# Patient Record
Sex: Male | Born: 1940 | Race: White | Hispanic: No | Marital: Single | Smoking: Former smoker
Health system: Southern US, Community
[De-identification: ages and names within clinical notes are randomized; demographics above are authoritative.]

## PROBLEM LIST (undated history)

## (undated) DIAGNOSIS — F329 Major depressive disorder, single episode, unspecified: Secondary | ICD-10-CM

## (undated) DIAGNOSIS — F32A Depression, unspecified: Secondary | ICD-10-CM

## (undated) DIAGNOSIS — I639 Cerebral infarction, unspecified: Secondary | ICD-10-CM

## (undated) DIAGNOSIS — G459 Transient cerebral ischemic attack, unspecified: Secondary | ICD-10-CM

## (undated) DIAGNOSIS — I10 Essential (primary) hypertension: Secondary | ICD-10-CM

## (undated) DIAGNOSIS — C679 Malignant neoplasm of bladder, unspecified: Secondary | ICD-10-CM

## (undated) DIAGNOSIS — E785 Hyperlipidemia, unspecified: Secondary | ICD-10-CM

## (undated) DIAGNOSIS — N184 Chronic kidney disease, stage 4 (severe): Secondary | ICD-10-CM

## (undated) DIAGNOSIS — I739 Peripheral vascular disease, unspecified: Secondary | ICD-10-CM

## (undated) DIAGNOSIS — Z515 Encounter for palliative care: Secondary | ICD-10-CM

## (undated) DIAGNOSIS — I714 Abdominal aortic aneurysm, without rupture: Secondary | ICD-10-CM

## (undated) DIAGNOSIS — C61 Malignant neoplasm of prostate: Secondary | ICD-10-CM

## (undated) HISTORY — DX: Encounter for palliative care: Z51.5

## (undated) HISTORY — DX: Abdominal aortic aneurysm, without rupture: I71.4

## (undated) HISTORY — DX: Hyperlipidemia, unspecified: E78.5

## (undated) HISTORY — DX: Chronic kidney disease, stage 4 (severe): N18.4

## (undated) HISTORY — DX: Cerebral infarction, unspecified: I63.9

---

## 1992-08-05 DIAGNOSIS — C61 Malignant neoplasm of prostate: Secondary | ICD-10-CM

## 1992-08-05 HISTORY — DX: Malignant neoplasm of prostate: C61

## 1992-08-05 HISTORY — PX: PROSTATE SURGERY: SHX751

## 1993-08-05 DIAGNOSIS — I714 Abdominal aortic aneurysm, without rupture, unspecified: Secondary | ICD-10-CM

## 1993-08-05 HISTORY — PX: ABDOMINAL AORTIC ANEURYSM REPAIR: SUR1152

## 1993-08-05 HISTORY — DX: Abdominal aortic aneurysm, without rupture, unspecified: I71.40

## 1993-08-05 HISTORY — DX: Abdominal aortic aneurysm, without rupture: I71.4

## 2003-08-06 HISTORY — PX: HERNIA REPAIR: SHX51

## 2004-05-29 ENCOUNTER — Encounter: Admission: RE | Admit: 2004-05-29 | Discharge: 2004-05-29 | Payer: Self-pay | Admitting: Internal Medicine

## 2005-02-26 ENCOUNTER — Emergency Department (HOSPITAL_COMMUNITY): Admission: EM | Admit: 2005-02-26 | Discharge: 2005-02-26 | Payer: Self-pay | Admitting: Emergency Medicine

## 2005-03-06 ENCOUNTER — Encounter: Admission: RE | Admit: 2005-03-06 | Discharge: 2005-03-06 | Payer: Self-pay | Admitting: Family Medicine

## 2006-08-05 DIAGNOSIS — G459 Transient cerebral ischemic attack, unspecified: Secondary | ICD-10-CM

## 2006-08-05 HISTORY — DX: Transient cerebral ischemic attack, unspecified: G45.9

## 2006-10-22 ENCOUNTER — Encounter: Admission: RE | Admit: 2006-10-22 | Discharge: 2006-10-22 | Payer: Self-pay | Admitting: Gastroenterology

## 2007-12-05 ENCOUNTER — Ambulatory Visit: Payer: Self-pay | Admitting: Internal Medicine

## 2007-12-05 ENCOUNTER — Inpatient Hospital Stay (HOSPITAL_COMMUNITY): Admission: EM | Admit: 2007-12-05 | Discharge: 2007-12-10 | Payer: Self-pay | Admitting: Emergency Medicine

## 2007-12-07 ENCOUNTER — Encounter (INDEPENDENT_AMBULATORY_CARE_PROVIDER_SITE_OTHER): Payer: Self-pay | Admitting: Gastroenterology

## 2007-12-29 ENCOUNTER — Encounter: Admission: RE | Admit: 2007-12-29 | Discharge: 2007-12-29 | Payer: Self-pay | Admitting: Gastroenterology

## 2008-01-08 ENCOUNTER — Encounter: Admission: RE | Admit: 2008-01-08 | Discharge: 2008-01-08 | Payer: Self-pay | Admitting: Gastroenterology

## 2008-02-29 ENCOUNTER — Ambulatory Visit (HOSPITAL_COMMUNITY): Admission: RE | Admit: 2008-02-29 | Discharge: 2008-02-29 | Payer: Self-pay | Admitting: Surgery

## 2008-08-05 DIAGNOSIS — C679 Malignant neoplasm of bladder, unspecified: Secondary | ICD-10-CM

## 2008-08-05 HISTORY — DX: Malignant neoplasm of bladder, unspecified: C67.9

## 2008-08-05 HISTORY — PX: COLON SURGERY: SHX602

## 2009-04-28 ENCOUNTER — Ambulatory Visit (HOSPITAL_COMMUNITY): Admission: RE | Admit: 2009-04-28 | Discharge: 2009-04-28 | Payer: Self-pay | Admitting: Gastroenterology

## 2009-12-13 ENCOUNTER — Encounter: Admission: RE | Admit: 2009-12-13 | Discharge: 2009-12-13 | Payer: Self-pay | Admitting: Family Medicine

## 2010-08-26 ENCOUNTER — Encounter: Payer: Self-pay | Admitting: Vascular Surgery

## 2010-08-26 ENCOUNTER — Encounter: Payer: Self-pay | Admitting: Gastroenterology

## 2010-12-18 NOTE — Op Note (Signed)
NAME:  Kevin Escobar, Kevin Escobar NO.:  [id number]   MEDICAL RECORD NO.:  [id number]          PATIENT TYPE:  INP   LOCATION:  5013                         FACILITY:  [facility]   PHYSICIAN:  John C. Madilyn Fireman, M.D.    DATE OF BIRTH:  [birth year]   DATE OF PROCEDURE:  DATE OF DISCHARGE:                               OPERATIVE REPORT   INDICATIONS FOR PROCEDURE:  Abdominal pain with sigmoid colon mass with  suggestion of ischemic colitis.   PROCEDURE:  The patient was placed in the left lateral decubitus  position and placed on the pulse monitor with continuous low-flow oxygen  delivered by nasal cannula.  He was sedated with 50 mcg of IV fentanyl  and 4 mg IV Versed.  Olympus video colonoscope was inserted into the  rectum and advanced to the ileocecal valve.  The prep was very poor in  this area, and the patient had not been given a full bowel prep, but  only 2 Fleet Enema.  I was not able to see much detail of the cecum, and  the visualization of part of the ascending transverse colon were  somewhat limited as well, although normal when the representative areas  were washed with the syringe.  There appeared to be no mucosal  abnormalities until. About 55 cm where there was a fairly abrupt  transition to diffuse nodular colitis with confluent white exudate.  This was most consistent appearance with ischemic colitis.  The mucosa  was somewhat firm, and it was biopsied.  This lasted over about 10-15 cm  but gradually attenuated a normal mucosa down to about 30 cm where there  was a vague mass effect of protuberant mucosal folds that appeared to  have proximal dilatation above it creating somewhat of a volvulus-type  effect.  It was somewhat difficult to pass the scope through this area.  I really did not see any definite mucosal abnormalities other than some  edema and granularity, and I took a couple of biopsies from the area  where the lumina appeared to be much narrow.  The mucosa  distal to this  donut shaped possibly extrinsic mass was normal.  The scope was then  withdrawn, and the patient returned to the recovery room in stable  condition.  He tolerated the procedure well.  There were no immediate  complications.   IMPRESSION:  1. Segmental colitis of the descending colon, most likely consistent      with ischemia.  2. Persistent mass effect without any intrinsic neoplasm seen in the      sigmoid colon.   PLAN:  Await biopsies.  Treat with bowel rest and antibiotics at some  point.  If his colitis heals, I think he may need surgical resection of  the vague mass in the sigmoid colon.           ______________________________  Everardo All Madilyn Fireman, M.D.     JCH/MEDQ  D:  12/07/2007  T:  12/08/2007  Job:  [id number]

## 2010-12-18 NOTE — Discharge Summary (Signed)
NAME:  Kevin Escobar, Kevin Escobar NO.:  [id number]   MEDICAL RECORD NO.:  [id number]          PATIENT TYPE:  INP   LOCATION:  5013                         FACILITY:  [facility]   PHYSICIAN:  Michiel Cowboy, MDDATE OF BIRTH:  1940-08-12   DATE OF ADMISSION:  12/04/2007  DATE OF DISCHARGE:  12/10/2007                               DISCHARGE SUMMARY   PRIMARY CARE Tremar Wickens:  Dr. Hyacinth Meeker.   DISCHARGE DIAGNOSES:  Include:  1. Colitis, secondary to ischemia versus infectious etiology.  2. History of abdominal aortic aneurysm.  3. History of colon mass.  4. Ileus.  5. Chronic renal insufficiency.   STUDIES:  1. Abdomen film on Dec 05, 2007, and Dec 07, 2007, showing possible      ileus.  2. CT of the abdomen and pelvis done on Dec 05, 2007, showing stable      mass within the sigmoid colon, correlation with endoscopy is      warranted if not already performed.   Other studies include colonoscopy showing segmental colitis of the  ascending colon most likely consistent ischemia, persistent mass effect  without any intrinsic neoplasm seen in the sigmoid colon.  Masses  biopsied.   HOSPITAL COURSE:  Please see full H&P for details.  This is a 70-year-  old gentleman with history of abdominal aortic aneurysm who comes in  with history of nausea, vomiting, and abdominal pain.  CT showed a colon  mass, which the patient already had a history of as well as questionable  colitis.  The patient was admitted and started on Cipro and Flagyl.  GI  consult was called.  Dr. Madilyn Fireman has seen the patient.  He had a  colonoscopy performed that showed evidence of colitis possibly secondary  to ischemia and the patient was also started on antibiotics for  presumptive potential infectious colitis as well and great improvement.  A plain KUB initially showed ileus, but the patient was able to tolerate  p.o. and his nausea and vomiting resolved at the time of discharge.   Chronic renal insufficiency.   Per review of records, the patient's  creatinine is between 1.4 to 1.6 or so.  At the time of discharge, the  patient's creatinine 1.6, which is close to his baseline.  The patient  probably will need to have this followed up as an outpatient.   Colon mass.  The patient had a past history of this in the past.  CT of  the abdomen confirmed this.  The patient will need an outpatient  followup with surgery for possible resection during colonoscopy as part  of mass was sent for biopsy.  The patient will need to have followup  with the pathology.   DISCHARGE MEDICATIONS:  1. Cipro 500 mg p.o. b.i.d. x10 days.  2. Flagyl 400 mg p.o. t.i.d. x10 days.  3. Protonix 40 mg p.o. daily.  4. Aspirin 81 mg p.o. daily.  5. The patient is to hold Benicar until further evaluated by his      primary care Chaunice Obie.   FOLLOWUP:  1. The patient is to follow up with  Dr. Hyacinth Meeker in the next week to      have his creatinine followed as well as post hospitalization.  2. The patient is to follow up in 1 month with Dr. Dr. Letitia Libra of GI,      and the patient is to follow up his scheduled appointment with Dr.      Jamey Ripa before surgery for further evaluation of a colon mass.      Michiel Cowboy, MD  Electronically Signed     AVD/MEDQ  D:  12/10/2007  T:  12/11/2007  Job:  [id number]   cc:   Everardo All. Madilyn Fireman, M.D.  Currie Paris, M.D.  Gracelyn Nurse, M.D.

## 2010-12-18 NOTE — Consult Note (Signed)
NAME:  Kevin Escobar, Kevin Escobar NO.:  [id number]   MEDICAL RECORD NO.:  [id number]          PATIENT TYPE:  INP   LOCATION:  5013                         FACILITY:  [facility]   PHYSICIAN:  John C. Madilyn Fireman, M.D.    DATE OF BIRTH:  Jun 29, 1941   DATE OF CONSULTATION:  12/06/2007  DATE OF DISCHARGE:                                 CONSULTATION   REASON FOR CONSULTATION:  Abdominal pain, nausea and vomiting,  leukocytosis, and sigmoid mass on CT as well as question of colitis.   HISTORY OF ILLNESS:  The patient is a 70 year old white male, somewhat  of a poor historian, who presents with about a 1-week history of  variable migrating abdominal pain upper and lower which became worse 2  days ago with two or three episodes of vomiting that day only, which  prompted him to come to the emergency room.  He had a white blood cell  count of 29,000 and underwent an abdominal CT scan which showed stable  positioning of abdominal aortic aneurysm graft and persistence of a 3-  to 4-cm sigmoid mass that was seen on a previous CT scan a year ago.  Also suggested was some mild descending colitis.  He had a colonoscopy  by Dr. Laural Benes which revealed this mass in March 2008 but biopsies were  negative.  It was recommended for repeat colonoscopy but never followed  up.  The patient has been having bowel movements for the most part  although they have decreased in frequency and required some straining in  the last 3 or 4 days.  He denies any known fever at home but did have a  temperature of 100.8 today.  He has been started on empiric antibiotics  and he was thought initially to possibly have pneumonia but on review of  chest x-ray this appears more likely to be atelectasis.  He is currently  on levofloxacin.   PAST MEDICAL HISTORY:  1. Colon mass as mentioned above.  2. History of aortic aneurysm with grafting.  3. Mild ascending thoracic aneurysm by history.   MEDICATIONS:  None.   ALLERGIES:   None.   SOCIAL HISTORY:  The patient has long-time cigarette smoking.  Denies  alcohol use.   FAMILY HISTORY:  Noncontributory.   PHYSICAL EXAMINATION:  Well-developed, well-nourished white male in no  acute distress, somewhat forgetful and a bit of a poor historian with  admitted poor memory.  HEART:  Regular rate and rhythm without murmur.  ABDOMEN:  Soft, slightly distended with normoactive bowel sounds.  There  is mild diffuse tenderness.  There appears to some mild focal rebound  diffusely as well.   LABORATORIES:  WBC 29,000 initially; 20,000 today.  Hemoglobin 16.5,  platelet count 337.  Amylase and lactic acid level are normal.  Liver  function tests within normal limits.  CO2 21, BUN 33, creatinine 1.89.  KUB done yesterday shows some mild dilatation of the large and small  bowel compatible with a possible ileus.   IMPRESSION:  Abdominal pain, nausea, vomiting, leukocytosis with  suggestion of possible ischemic colitis, but also with  a known sigmoid  mass, the etiology of which is unclear.   PLAN:  Agree with empiric antibiotics.  Will probably otherwise begin  workup with sigmoidoscopy and repeat biopsy of the mass tomorrow.           ______________________________  Everardo All Madilyn Fireman, M.D.     JCH/MEDQ  D:  12/06/2007  T:  12/06/2007  Job:  [id number]   cc:   Danise Edge, M.D.

## 2010-12-18 NOTE — H&P (Signed)
NAME:  Kevin Escobar, Kevin Escobar NO.:  [id number]   MEDICAL RECORD NO.:  [id number]          PATIENT TYPE:  EMS   LOCATION:  MAJO                         FACILITY:  [facility]   PHYSICIAN:  Gardiner Barefoot, MD    DATE OF BIRTH:  10-17-1940   DATE OF ADMISSION:  12/04/2007  DATE OF DISCHARGE:                              HISTORY & PHYSICAL   PRIMARY CARE PHYSICIAN:  Dr. Hyacinth Meeker.   CHIEF COMPLAINT:  Nausea and vomiting.   HISTORY OF PRESENT ILLNESS:  This is a 70 year old male with a history  of smoking and history of CAT scan with colonic mass consistent with a  primary cancer, and a history of abdominal aortic aneurysm with the  grafting, who presents with 2 days of nausea and vomiting and 1 week of  abdominal pain.  The patient states that he had been constipated, and  otherwise he has had no fever, no shortness of breath, or any other  significant complaints.   PAST MEDICAL HISTORY:  1. Abdominal aortic aneurysm with grafting.  2. Mild ascending thoracic aneurysm.  3. Colon mass.   MEDICATIONS:  None.   ALLERGIES:  No known drug allergies.   SOCIAL HISTORY:  The patient is a smoker.  He denies any alcohol or  drugs.   FAMILY HISTORY:  Noncontributory.   REVIEW OF SYSTEMS:  Review of systems is negative except as per the  history of present illness.   PHYSICAL EXAMINATION:  VITAL SIGNS:  Temperature 97.2, pulse is 114,  respirations 18, blood pressure is 147/86, and O2 sats 92%.  GENERAL:  The patient is awake, alert, and oriented x3, and appears in  no acute distress.  CARDIOVASCULAR:  Regular rate and rhythm.  No murmurs, rubs, or gallops.  LUNGS:  Clear to auscultation bilaterally.  ABDOMEN:  Soft, nontender, and nondistended with positive bowel sounds,  and no hepatosplenomegaly.  EXTREMITIES:  No cyanosis, clubbing, or edema.   LABORATORY DATA:  WBC of 29.6, 90% neutrophils, hemoglobin 16, platelets  337, sodium 141, potassium 4.0, chloride 107, bicarb 21,  BUN 33,  creatinine is 1.89, glucose 147, and AST 23.  CAT scan with stable mass  and a right lower lobe airspace disease.  The UA with trace leukocyte  esterase and positive proteinuria.   ASSESSMENT AND PLAN:  1. Pneumonia.  We will start the patient on azithromycin and      ceftriaxone for community-acquired pneumonia therapy.  2. Abdominal pain.  We will continue with a regimen for constipation      as his pain may be secondary to his reported constipation.      Certainly, if his abdominal mass in the sigmoid colon, it could      certainly be secondary to that, and if it does not improve with his      therapies for now then we will consider a surgical evaluation;      however, at this time, the patient is stable and does not have a      surgical abdomen.  3. Renal insufficiency with proteinuria.  Unclear if this new or not.  There is no recent history of an elevated creatinine or      proteinuria.  We will hydrate the patient and also sent off for a      urine culture to sure it is not an infection, and consider a      nephrology in put if it is persistent.      Gardiner Barefoot, MD  Electronically Signed     RWC/MEDQ  D:  12/05/2007  T:  12/05/2007  Job:  [id number]

## 2010-12-18 NOTE — Op Note (Signed)
NAME:  Kevin Escobar, Kevin Escobar NO.:  [id number]   MEDICAL RECORD NO.:  [id number]          PATIENT TYPE:  AMB   LOCATION:  SDS                          FACILITY:  [facility]   PHYSICIAN:  Currie Paris, M.D.DATE OF BIRTH:  1940-09-25   DATE OF PROCEDURE:  02/29/2008  DATE OF DISCHARGE:  02/29/2008                               OPERATIVE REPORT   PREOPERATIVE DIAGNOSIS:  Epigastric hernia.   POSTOPERATIVE DIAGNOSIS:  Epigastric hernia.   OPERATION:  Repair of epigastric hernia with mesh.   SURGEON:  Currie Paris, MD   ANESTHESIA:  General.   CLINICAL HISTORY:  This is a 70 year old gentleman who has an epigastric  hernia, although the defect felt relatively small, had at one point a  fair amount of material protruding out.  He has also had a question of a  colon mass that has been stable and he has declined further evaluation  of that on a couple of occasions including declining laparoscopy and/or  laparotomy and colon resection.  However, because he had had  symptomatology from this epigastric hernia, he wished to have that  fixed.   DESCRIPTION OF PROCEDURE:  The patient was seen in the holding area and  had no further questions.  I marked the area of the epigastric hernia,  which was just above the umbilicus.   The patient was taken to the operating room and after satisfactory  general anesthesia had been obtained, the abdomen was clipped, prepped,  and draped.  The time-out was done.   I used some 0.25% plain Marcaine to help with postop analgesia.  I  infiltrated the skin and subcutaneous tissues.  I made a transverse  incision, identified some preperitoneal fat protruding out through the  hernia.  This was freed up from the subcutaneous tissue, freed up from  the fascia, and the fascial defect was identified.  The fascia was thin  but intact around it, but the defect measured about 3 cm in diameter.  It was actually a little bit oval shaped,  oriented, a little bit more  transversely than vertically.  I elevated the subcutaneous tissue off  the fascia for distance of about 2.5-3 cm in all directions around the  defect.   Everything that was protruding out was reduced.  The defect was then  closed with 5 sutures of 0 Prolene and tied down easily and with no  tension.  The first, third, and fifth sutures were left uncut.   A piece of Ethicon mesh was then used.  This was cut into a circular  shape and the remaining 3 sutures that had been used to close the  fascial defect were threaded through this and tied down to anchor it to  the repair.  Eight additional sutures were used to tack the edges of the  defect onto the fascia well away from the repair.  This all went down  easily.   The area was irrigated and was dry.  I put more Marcaine in.  I closed  in layers with 3-0 Vicryl and 3-0 Monocryl subcuticular plus Dermabond.   The  patient tolerated the procedure well and there were no  complications.  All counts were correct.      Currie Paris, M.D.  Electronically Signed     CJS/MEDQ  D:  02/29/2008  T:  03/01/2008  Job:  [id number]   cc:   Dellis Anes. Idell Pickles, M.D.  Danise Edge, M.D.

## 2011-04-17 ENCOUNTER — Other Ambulatory Visit: Payer: Self-pay | Admitting: Neurology

## 2011-04-17 DIAGNOSIS — I639 Cerebral infarction, unspecified: Secondary | ICD-10-CM

## 2011-04-18 ENCOUNTER — Ambulatory Visit
Admission: RE | Admit: 2011-04-18 | Discharge: 2011-04-18 | Disposition: A | Discharge status: Home / Self Care | Payer: PRIVATE HEALTH INSURANCE | Source: Ambulatory Visit | Attending: Neurology | Admitting: Neurology

## 2011-04-18 DIAGNOSIS — I639 Cerebral infarction, unspecified: Secondary | ICD-10-CM

## 2011-05-03 LAB — CBC
HCT: 39.9
Hemoglobin: 13.5
MCHC: 33.9
MCV: 86.9
Platelets: 243
RBC: 4.59
RDW: 16.1 — ABNORMAL HIGH
WBC: 9.5

## 2011-05-03 LAB — DIFFERENTIAL
Basophils Absolute: 0.1
Basophils Relative: 1
Eosinophils Absolute: 0.3
Eosinophils Relative: 3
Lymphocytes Relative: 29
Lymphs Abs: 2.7
Monocytes Absolute: 1
Monocytes Relative: 10
Neutro Abs: 5.5
Neutrophils Relative %: 58

## 2011-05-03 LAB — COMPREHENSIVE METABOLIC PANEL
ALT: 14
AST: 13
Albumin: 3.5
Alkaline Phosphatase: 53
BUN: 19
CO2: 26
Calcium: 9.5
Chloride: 107
Creatinine, Ser: 1.48
GFR calc Af Amer: 57 — ABNORMAL LOW
GFR calc non Af Amer: 47 — ABNORMAL LOW
Glucose, Bld: 71
Potassium: 4
Sodium: 139
Total Bilirubin: 0.6
Total Protein: 7.1

## 2011-05-03 LAB — URINALYSIS, ROUTINE W REFLEX MICROSCOPIC
Glucose, UA: NEGATIVE
Hgb urine dipstick: NEGATIVE
Leukocytes, UA: NEGATIVE
Protein, ur: 30 — AB
Specific Gravity, Urine: 1.021
pH: 6.5

## 2011-05-03 LAB — URINE MICROSCOPIC-ADD ON

## 2011-05-31 ENCOUNTER — Other Ambulatory Visit: Payer: Self-pay | Admitting: Neurology

## 2011-06-05 ENCOUNTER — Ambulatory Visit
Admission: RE | Admit: 2011-06-05 | Discharge: 2011-06-05 | Disposition: A | Discharge status: Home / Self Care | Payer: No Typology Code available for payment source | Source: Ambulatory Visit | Attending: Neurology | Admitting: Neurology

## 2011-06-05 ENCOUNTER — Other Ambulatory Visit: Payer: Self-pay | Admitting: Neurology

## 2011-06-05 MED ORDER — GADOBENATE DIMEGLUMINE 529 MG/ML IV SOLN
7.0000 mL | Freq: Once | INTRAVENOUS | Status: AC | PRN
  Administered 2011-06-05: 7 mL via INTRAVENOUS

## 2012-05-08 ENCOUNTER — Telehealth (HOSPITAL_COMMUNITY): Payer: Self-pay

## 2012-05-08 NOTE — Telephone Encounter (Signed)
Dr. Bonnielee Haff reviewed and stated that she would need to be seen in consult first.  I faxed orders to [facility]. And sent an staff message.

## 2012-05-11 ENCOUNTER — Other Ambulatory Visit: Payer: Self-pay | Admitting: Nephrology

## 2012-05-11 DIAGNOSIS — I701 Atherosclerosis of renal artery: Secondary | ICD-10-CM

## 2012-05-11 DIAGNOSIS — I10 Essential (primary) hypertension: Secondary | ICD-10-CM

## 2012-05-11 DIAGNOSIS — N289 Disorder of kidney and ureter, unspecified: Secondary | ICD-10-CM

## 2012-05-13 ENCOUNTER — Ambulatory Visit
Admission: RE | Admit: 2012-05-13 | Discharge: 2012-05-13 | Disposition: A | Discharge status: Home / Self Care | Payer: No Typology Code available for payment source | Source: Ambulatory Visit | Attending: Nephrology | Admitting: Nephrology

## 2012-05-13 DIAGNOSIS — I701 Atherosclerosis of renal artery: Secondary | ICD-10-CM

## 2012-05-13 DIAGNOSIS — N289 Disorder of kidney and ureter, unspecified: Secondary | ICD-10-CM

## 2012-05-13 DIAGNOSIS — I10 Essential (primary) hypertension: Secondary | ICD-10-CM

## 2012-05-13 HISTORY — DX: Transient cerebral ischemic attack, unspecified: G45.9

## 2012-05-13 HISTORY — DX: Malignant neoplasm of prostate: C61

## 2012-05-13 HISTORY — DX: Depression, unspecified: F32.A

## 2012-05-13 HISTORY — DX: Major depressive disorder, single episode, unspecified: F32.9

## 2012-05-13 HISTORY — DX: Malignant neoplasm of bladder, unspecified: C67.9

## 2012-05-13 HISTORY — DX: Essential (primary) hypertension: I10

## 2012-05-14 ENCOUNTER — Other Ambulatory Visit: Payer: No Typology Code available for payment source

## 2012-05-14 NOTE — Consult Note (Signed)
Interventional Radiology New Patient Clinic Consultation  Reason for Consult: Evaluate for renovascular hypertension Referring Physician: Primitivo Gauze, MD  CC: Hypertension, decreasing renal function  HPI:   Mr. 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 Mr. Colclough has a questionable history of lupus diagnosed in 1995. He reports that he was told that this was a limited manifestation of the disease and that he would not have future problems. No records are available from this time. Additionally, Mr. Barnett has a long history of NSAID use, hypertension and probable nephrosclerosis of aging. 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   Mr. Bonn is a very pleasant gentleman. Currently, he is asymptomatic. He denies feelings of fatigue, tiredness, back pain, headaches, hematuria, or other symptoms. He seems a bit surprised at this underlying renal dysfunction noting that this all began when he underwent treatment for this colon issues several years ago.   Past Medical History:   Hypertension, transient ischemic attack five years ago, bladder cancer treated with intracystic BCG, prostate cancer treated at [facility], peripheral arterial disease, abdominal aortic aneurysm status post stent graft repair at [facility] in 1995.   Previous Surgical History:   Prostate resection 1994, AAA at [facility] in 1995, partial colonic resection for colovesicular fistula in 2006.    Medications:  Gabapentin 600 mg tid  Metoprolol 50 mg tid  Sertraline 100 mg once daily  Simvastatin 20 mg once daily  Amlodipine 10 mg bid  Acetaminophen 1000 mg bid  Lasix 40 mg once daily   Allergies: Plaquenil results in severe rash, Cheerios results in mild rash. No latex or iodinated contrast allergy.   Social History: Mr. Decuir is married and lives at home with his wife. He has two grown children. He is currently retired having worked in the foreign service. He does have a history of smoking and smokes one half to one pack daily. He denies use of alcohol or recreational drugs.   Family History: His mother has hypertension and his sister, diabetes.   Review of symptoms: A complete review of systems was negative other than the pertinent positives as listed in the history of present illness.   Physical Exam:  Vital Signs - Temperature 97.5, heart rate 77, blood pressure 153/87, respiratory rate 18, oxygen saturation 93 percent on room air. Weight - 160 pounds. Height - 5'10"  General: Well nourished, well developed male in no apparent  distress. He appears his stated age.  Cardiovascular: Regular rate and rhythm. No murmurs, rubs or  gallops.  Pulmonary: Clear to auscultation bilaterally.  Abdomen: Soft, nontender, nondistended. No abdominal bruit  detected.  Pulses: 1+ palpable femoral pulses bilaterally.   Pertinent laboratory results from 05/04/12: Serum creatinine 2.34, GFR 27.   Assessment and Plan:   Mr. Magnan is a 71 year old gentleman with stage IV chronic kidney disease, likely of multifactorial etiology. By renal ultrasound from [facility], he has sonographic evidence suggesting a high grade stenosis of the right renal artery, and high grade stenosis versus occlusion of the left renal artery. I have reviewed his noncontrast  CT scans of the abdomen from our institution and he has had a prior endovascular aortic repair with what appears to be a [employer] bifurcated stent graft with suprarenal fixation. The suprarenal fixation component consists of pararenal bare metal stents with small hooks. The bare metal stents project above the covered portion of the graft and often  cross the origins of the renal arteries. Recent literature has described a potential significant decrease in GFR over time in patients with abdominal aortic aneurysm grafts with suprarenal fixation. The exact etiology, or significance of these early findings is as yet uncertain. Differential considerations include induced stenosis of the renal artery ostia, microthrombus formation on the struts with distal emboli, and other etiologies. The presence of these suprarenal fixation struts may or may not be a contributing factor for Mr. Quirk.   Given the ultrasound findings highly suspicious for renal arterial stenosis, I think that further evaluation with C02 renal  angiography is indicated to assess the degree of renal arterial stenosis. If there is significant stenosis (greater than 70  percent diameter stenosis, or a significant pressure drop across the stenotic lesion) endovascular treatment with placement of a renal arterial stent may have some utility. In appropriately selected patients with significant stenoses, renal artery stenting has shown stabilization of decreasing renal function, as well as some improvement in hypertension.   I discussed the risks and benefits of the procedure at length with Mr. Kiddy and answered all of his questions. At this time, he would like to go home and discuss the diagnostic, and potentially therapeutic procedure with his wife. I gave him some hand outs from The Society of Interventional Radiology with information on renovascular hypertension, renal angiography and renal stenting.   Thank you very much for sending Mr. Cessna to [place] in consultation. If he decides to proceed with C02 renalangiography, I will look forward to seeing him again. Until  then, it has been a pleasure participating in his care.   Signed,   Sterling Big, M.D.  Vascular and Interventional Radiologist  [facility]

## 2012-06-15 ENCOUNTER — Telehealth (HOSPITAL_COMMUNITY): Payer: Self-pay | Admitting: Interventional Radiology

## 2012-06-15 NOTE — Telephone Encounter (Signed)
I called the pt today, 06/15/12 to schedule his appointment for the renal angio w/ possible stenting.  Pt states that he wants to postpone this procedure until after the first of the year (2014).  Karle Barr

## 2012-07-06 ENCOUNTER — Other Ambulatory Visit: Payer: Self-pay | Admitting: Interventional Radiology

## 2012-07-06 ENCOUNTER — Telehealth: Payer: Self-pay | Admitting: Emergency Medicine

## 2012-07-06 DIAGNOSIS — I701 Atherosclerosis of renal artery: Secondary | ICD-10-CM

## 2012-07-06 NOTE — Telephone Encounter (Signed)
PT. CALLED TO STATE THAT HE WANTED THE PROCEDURE IN JAN. 2014.  ALSO STATED SOME CONCERNS ABOUT THE PROCEDURE AND THE AFTER EFFECTS.  I ASKED PT IF HE WOULD LIKE TO S/W DR MCCULLOUGH TO ADDRESS THESE CONCERNS AND WE MADE AN APPT. FOR 07-22-12 AT 130PM.

## 2012-07-22 ENCOUNTER — Ambulatory Visit
Admission: RE | Admit: 2012-07-22 | Discharge: 2012-07-22 | Disposition: A | Discharge status: Home / Self Care | Payer: No Typology Code available for payment source | Source: Ambulatory Visit | Attending: Interventional Radiology | Admitting: Interventional Radiology

## 2012-07-22 DIAGNOSIS — I701 Atherosclerosis of renal artery: Secondary | ICD-10-CM

## 2012-08-04 NOTE — Progress Notes (Signed)
Interventional Radiology Established Patient Clinic Note  Date: 07/22/12  Chief Complaint: Progressive decreasing renal function, medically refractory hypertension  History:  Kevin Escobar is a 71 year old gentleman with stage IV chronic kidney disease likely multifactorial etiology. 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 He was initially seen by me in consultation and October 4 CO2 renal angiogram and possible angioplasty versus stenting of the renal arteries if there is any hemodynamically significant stenosis. He has discussed undergoing this procedure with his wife and also with his nephrologist, Dr. Briant Cedar, and returned to clinic today to ask a few more questions before scheduling the procedure.  I was able to answer all of his questions fully. Specifically, we talked about the technique of stenting and the risks and benefits of the procedure. He does wish get the procedure scheduled as soon as possible and we will do best to accommodate him.  In terms of his interval clinical history, he continues to smoke however he has experienced no new symptoms. He continues to deny chest pain, shortness of breath and claudication.  Exam: Vital signs: Temperature 97.4 degrees, heart rate 64, blood pressure 152/83, respiratory rate 18, oxygen saturation 95% on roomair Pulmonary: Clear auscultation bilaterally Cardiac: Regular rate rhythm Neuro: Alert and oriented x3, appropriate affect  Assessment and Plan:  71 year old male with stage IV chronic kidney disease likely of multifactorial etiology. 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   1. We will schedule a CO2 renal angiogram and intervention if indicated for early January.  2. I recommended that he begin taking a baby aspirin daily given his continued smoking  and known atherosclerotic vascular disease.  He is at risk for heart attack and stroke.  3. Strongly encouraged smoking cessation.    Signed,  Kevin Big, MD Vascular & Interventional Radiologist [facility]

## 2012-08-17 ENCOUNTER — Telehealth: Payer: Self-pay | Admitting: Emergency Medicine

## 2012-08-17 NOTE — Telephone Encounter (Signed)
RETURNED PT CALL FROM Friday AFTERNOON.  PT IS READY TO SET UP CO2 RENAL ANGIO W/ DR MCCULLOUGH.  I TOLD PT WILL CALL JENNIFER AT [facility] AND HAVE HER CONTACT PT DIRECTLY.   LMOVM FOR JENNIFER AT 9:16AM TO CALL PT TO SET UP PROCEDURE.

## 2012-08-18 ENCOUNTER — Other Ambulatory Visit (HOSPITAL_COMMUNITY): Payer: Self-pay | Admitting: Interventional Radiology

## 2012-08-18 ENCOUNTER — Telehealth (HOSPITAL_COMMUNITY): Payer: Self-pay | Admitting: Interventional Radiology

## 2012-08-18 DIAGNOSIS — I701 Atherosclerosis of renal artery: Secondary | ICD-10-CM

## 2012-08-19 ENCOUNTER — Other Ambulatory Visit: Payer: Self-pay | Admitting: Radiology

## 2012-08-20 ENCOUNTER — Encounter (HOSPITAL_COMMUNITY): Payer: Self-pay

## 2012-08-27 ENCOUNTER — Observation Stay (HOSPITAL_COMMUNITY)
Admission: RE | Admit: 2012-08-27 | Discharge: 2012-08-28 | Disposition: A | Discharge status: Home / Self Care | Payer: No Typology Code available for payment source | Source: Ambulatory Visit | Attending: Interventional Radiology | Admitting: Interventional Radiology

## 2012-08-27 ENCOUNTER — Other Ambulatory Visit (HOSPITAL_COMMUNITY): Payer: Self-pay | Admitting: Interventional Radiology

## 2012-08-27 ENCOUNTER — Encounter (HOSPITAL_COMMUNITY): Payer: Self-pay

## 2012-08-27 VITALS — BP 144/81 | HR 66 | Temp 98.0°F | Resp 16 | Ht 71.0 in | Wt 150.8 lb

## 2012-08-27 DIAGNOSIS — Z8551 Personal history of malignant neoplasm of bladder: Secondary | ICD-10-CM | POA: Insufficient documentation

## 2012-08-27 DIAGNOSIS — I150 Renovascular hypertension: Secondary | ICD-10-CM | POA: Insufficient documentation

## 2012-08-27 DIAGNOSIS — I701 Atherosclerosis of renal artery: Principal | ICD-10-CM | POA: Insufficient documentation

## 2012-08-27 DIAGNOSIS — Z8546 Personal history of malignant neoplasm of prostate: Secondary | ICD-10-CM | POA: Insufficient documentation

## 2012-08-27 DIAGNOSIS — N184 Chronic kidney disease, stage 4 (severe): Secondary | ICD-10-CM | POA: Insufficient documentation

## 2012-08-27 DIAGNOSIS — I129 Hypertensive chronic kidney disease with stage 1 through stage 4 chronic kidney disease, or unspecified chronic kidney disease: Secondary | ICD-10-CM | POA: Insufficient documentation

## 2012-08-27 LAB — CBC WITH DIFFERENTIAL/PLATELET
Basophils Absolute: 0.1 10*3/uL (ref 0.0–0.1)
Basophils Relative: 1 % (ref 0–1)
Eosinophils Absolute: 1.3 10*3/uL — ABNORMAL HIGH (ref 0.0–0.7)
Eosinophils Relative: 13 % — ABNORMAL HIGH (ref 0–5)
MCH: 29.2 pg (ref 26.0–34.0)
MCHC: 34 g/dL (ref 30.0–36.0)
MCV: 85.9 fL (ref 78.0–100.0)
Neutrophils Relative %: 55 % (ref 43–77)
Platelets: 229 10*3/uL (ref 150–400)
RBC: 4.89 MIL/uL (ref 4.22–5.81)
RDW: 14.2 % (ref 11.5–15.5)

## 2012-08-27 LAB — BASIC METABOLIC PANEL
BUN: 37 mg/dL — ABNORMAL HIGH (ref 6–23)
Calcium: 9.8 mg/dL (ref 8.4–10.5)
Creatinine, Ser: 2.77 mg/dL — ABNORMAL HIGH (ref 0.50–1.35)
GFR calc non Af Amer: 21 mL/min — ABNORMAL LOW (ref >90)
Glucose, Bld: 100 mg/dL — ABNORMAL HIGH (ref 70–99)
Sodium: 140 mEq/L (ref 135–145)

## 2012-08-27 LAB — PROTIME-INR
INR: 0.96 (ref 0.00–1.49)
Prothrombin Time: 12.7 seconds (ref 11.6–15.2)

## 2012-08-27 MED ORDER — AMLODIPINE BESYLATE 10 MG PO TABS
10.0000 mg | ORAL_TABLET | Freq: Two times a day (BID) | ORAL | Status: DC
  Administered 2012-08-27 – 2012-08-28 (×3): 10 mg via ORAL
  Filled 2012-08-27 (×4): qty 1

## 2012-08-27 MED ORDER — MIDAZOLAM HCL 2 MG/2ML IJ SOLN
INTRAMUSCULAR | Status: AC | PRN
  Administered 2012-08-27: 1 mg via INTRAVENOUS
  Administered 2012-08-27 (×3): 0.5 mg via INTRAVENOUS

## 2012-08-27 MED ORDER — SERTRALINE HCL 100 MG PO TABS
100.0000 mg | ORAL_TABLET | Freq: Every day | ORAL | Status: DC
  Administered 2012-08-27 – 2012-08-28 (×2): 100 mg via ORAL
  Filled 2012-08-27 (×2): qty 1

## 2012-08-27 MED ORDER — FENTANYL CITRATE 0.05 MG/ML IJ SOLN
INTRAMUSCULAR | Status: AC
  Filled 2012-08-27: qty 4

## 2012-08-27 MED ORDER — SODIUM CHLORIDE 0.9 % IV SOLN
Freq: Once | INTRAVENOUS | Status: AC
  Administered 2012-08-27: 1000 mL via INTRAVENOUS

## 2012-08-27 MED ORDER — HEPARIN SODIUM (PORCINE) 1000 UNIT/ML IJ SOLN
INTRAMUSCULAR | Status: AC
  Filled 2012-08-27: qty 1

## 2012-08-27 MED ORDER — FUROSEMIDE 40 MG PO TABS
40.0000 mg | ORAL_TABLET | Freq: Every day | ORAL | Status: DC
  Administered 2012-08-27 – 2012-08-28 (×2): 40 mg via ORAL
  Filled 2012-08-27 (×2): qty 1

## 2012-08-27 MED ORDER — HYDROCODONE-ACETAMINOPHEN 5-325 MG PO TABS: 1.0000 | ORAL_TABLET | ORAL | Status: DC | PRN

## 2012-08-27 MED ORDER — FENTANYL CITRATE 0.05 MG/ML IJ SOLN
INTRAMUSCULAR | Status: AC | PRN
  Administered 2012-08-27: 50 ug via INTRAVENOUS
  Administered 2012-08-27: 25 ug via INTRAVENOUS

## 2012-08-27 MED ORDER — CEFAZOLIN SODIUM 1-5 GM-% IV SOLN
INTRAVENOUS | Status: AC
  Filled 2012-08-27: qty 50

## 2012-08-27 MED ORDER — SIMVASTATIN 10 MG PO TABS
10.0000 mg | ORAL_TABLET | Freq: Every day | ORAL | Status: DC
  Administered 2012-08-27: 10 mg via ORAL
  Filled 2012-08-27 (×2): qty 1

## 2012-08-27 MED ORDER — DIPHENHYDRAMINE HCL 50 MG/ML IJ SOLN
INTRAMUSCULAR | Status: AC
  Filled 2012-08-27: qty 1

## 2012-08-27 MED ORDER — FENTANYL CITRATE 0.05 MG/ML IJ SOLN
INTRAMUSCULAR | Status: AC | PRN
  Administered 2012-08-27: 25 ug via INTRAVENOUS

## 2012-08-27 MED ORDER — HEPARIN SODIUM (PORCINE) 1000 UNIT/ML IJ SOLN
INTRAMUSCULAR | Status: AC | PRN
  Administered 2012-08-27: 3000 [IU] via INTRAVENOUS
  Administered 2012-08-27: 2000 [IU] via INTRAVENOUS

## 2012-08-27 MED ORDER — METOPROLOL TARTRATE 50 MG PO TABS
75.0000 mg | ORAL_TABLET | Freq: Two times a day (BID) | ORAL | Status: DC
  Administered 2012-08-27 – 2012-08-28 (×3): 75 mg via ORAL
  Filled 2012-08-27 (×4): qty 1

## 2012-08-27 MED ORDER — GABAPENTIN 600 MG PO TABS
600.0000 mg | ORAL_TABLET | Freq: Three times a day (TID) | ORAL | Status: DC
  Administered 2012-08-27 – 2012-08-28 (×3): 600 mg via ORAL
  Filled 2012-08-27 (×5): qty 1

## 2012-08-27 MED ORDER — GABAPENTIN 300 MG PO CAPS
600.0000 mg | ORAL_CAPSULE | Freq: Three times a day (TID) | ORAL | Status: DC
  Filled 2012-08-27 (×2): qty 2

## 2012-08-27 MED ORDER — DIPHENHYDRAMINE HCL 50 MG/ML IJ SOLN
INTRAMUSCULAR | Status: AC | PRN
  Administered 2012-08-27: 25 mg via INTRAVENOUS

## 2012-08-27 MED ORDER — CLOPIDOGREL BISULFATE 75 MG PO TABS
300.0000 mg | ORAL_TABLET | Freq: Once | ORAL | Status: AC
  Administered 2012-08-27: 300 mg via ORAL

## 2012-08-27 MED ORDER — CLOPIDOGREL BISULFATE 75 MG PO TABS
ORAL_TABLET | ORAL | Status: AC
  Filled 2012-08-27: qty 4

## 2012-08-27 MED ORDER — CEFAZOLIN SODIUM 1-5 GM-% IV SOLN
1.0000 g | Freq: Once | INTRAVENOUS | Status: AC
  Administered 2012-08-27: 1 g via INTRAVENOUS

## 2012-08-27 MED ORDER — MIDAZOLAM HCL 2 MG/2ML IJ SOLN
INTRAMUSCULAR | Status: AC
  Filled 2012-08-27: qty 4

## 2012-08-27 MED ORDER — ZOLPIDEM TARTRATE 5 MG PO TABS: 5.0000 mg | ORAL_TABLET | Freq: Every evening | ORAL | Status: DC | PRN

## 2012-08-27 MED ORDER — CLOPIDOGREL BISULFATE 75 MG PO TABS: 75.0000 mg | ORAL_TABLET | Freq: Every day | ORAL | Status: DC

## 2012-08-27 NOTE — H&P (Signed)
Chief Complaint: "I'm here for an angiogram" Referring Physician:Mattingly HPI: Kevin Escobar is an 72 y.o. 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 He was seen by Dr. Archer Asa in consult to discuss renal artery angiogram and possible angioplasty. He is scheduled today for this procedure. Due to his CKD, we are planning for CO2 angiogram PMHx and meds reviewed.  Past Medical History:  Past Medical History  Diagnosis Date  . Cancer of bladder 2010  . Cancer of prostate 1994  . Depression   . Chronic kidney disease   . Hypertension   . TIA (transient ischemic attack) 2008  . Peripheral vascular disease     Past Surgical History:  Past Surgical History  Procedure Date  . Abdominal aortic aneurysm repair 1995  . Prostate surgery 1994  . Colon surgery 2010    Family History: No family history on file.  Social History:  reports that he has been smoking.  He has never used smokeless tobacco. He reports that he does not drink alcohol or use illicit drugs.  Allergies:  Allergies  Allergen Reactions  . Plaquenil (Hydroxychloroquine Sulfate) Rash    Severe rash    Medications: acetaminophen (TYLENOL) 500 MG tablet Sig - Route: Take 500 mg by mouth every 4 (four) hours as needed. pain - Oral Class: Historical Med Number of times this order has been changed since signing: 3 Order [address] amLODipine (NORVASC) 10 MG tablet Sig - Route: Take 10 mg by mouth 2 (two) times daily. - Oral Class: Historical Med Number of times this order has been changed since signing: 2 Order [address] furosemide (LASIX) 40 MG tablet Sig - Route: Take 40 mg by mouth daily. - Oral Class: Historical Med Number of times this order has been changed since signing: 2 Order [address] gabapentin (NEURONTIN) 300 MG capsule Sig - Route: Take 600 mg by mouth 3 (three) times daily. - Oral Class: Historical Med Number of times this order has been changed since signing: 3 Order [address]  sertraline (ZOLOFT) 100 MG tablet Sig - Route: Take 100 mg by mouth daily. - Oral Class: Historical Med   Please HPI for pertinent positives, otherwise complete 10 system ROS negative.  Physical Exam: Blood pressure 158/93, pulse 78, temperature 97.6 F (36.4 C), temperature source Oral, resp. rate 18, height 5\' 10"  (1.778 m), weight 158 lb (71.668 kg), SpO2 95.00%. Body mass index is 22.67 kg/(m^2).   General Appearance:  Alert, cooperative, no distress, appears stated age  Head:  Normocephalic, without obvious abnormality, atraumatic  ENT: Unremarkable  Neck: Supple, symmetrical, trachea midline, no adenopathy, thyroid: not enlarged, symmetric, no tenderness/mass/nodules  Lungs:   Clear to auscultation bilaterally, no w/r/r, respirations unlabored without use of accessory muscles.  Chest Wall:  No tenderness or deformity  Heart:  Regular rate and rhythm, S1, S2 normal, no murmur, rub or gallop. Carotids 2+ without bruit.  Extremities: Extremities normal, atraumatic, no cyanosis or edema  Pulses: 1+ and symmetric  Neurologic: Normal affect, no gross deficits.   Results for orders placed during the hospital encounter of 08/27/12 (from the past 48 hour(s))  APTT     Status: Normal   Collection Time   08/27/12  6:44 AM      Component Value Range Comment   aPTT 33  24 - 37 seconds   CBC WITH DIFFERENTIAL     Status: Abnormal   Collection Time   08/27/12  6:44 AM  Component Value Range Comment   WBC 10.1  4.0 - 10.5 K/uL    RBC 4.89  4.22 - 5.81 MIL/uL    Hemoglobin 14.3  13.0 - 17.0 g/dL    HCT 30.8  65.7 - 84.6 %    MCV 85.9  78.0 - 100.0 fL    MCH 29.2  26.0 - 34.0 pg    MCHC 34.0  30.0 - 36.0 g/dL    RDW 96.2  95.2 - 84.1 %    Platelets 229  150 - 400 K/uL    Neutrophils Relative 55  43 - 77 %    Neutro Abs 5.6  1.7 - 7.7 K/uL    Lymphocytes Relative 23  12 - 46 %    Lymphs Abs 2.4  0.7 - 4.0 K/uL    Monocytes Relative 8  3 - 12 %    Monocytes Absolute 0.8  0.1 - 1.0  K/uL    Eosinophils Relative 13 (*) 0 - 5 %    Eosinophils Absolute 1.3 (*) 0.0 - 0.7 K/uL    Basophils Relative 1  0 - 1 %    Basophils Absolute 0.1  0.0 - 0.1 K/uL   PROTIME-INR     Status: Normal   Collection Time   08/27/12  6:44 AM      Component Value Range Comment   Prothrombin Time 12.7  11.6 - 15.2 seconds    INR 0.96  0.00 - 1.49    No results found.  Assessment/Plan RAS and CKD Plan for CO2 angiogram with possible angioplasty and stent. Labs reviewed. Discussed procedure details, including risks and complications Consent signed in chart   Brayton El PA-C 08/27/2012, 7:57 AM

## 2012-08-27 NOTE — ED Notes (Signed)
O2 d/c 

## 2012-08-27 NOTE — ED Notes (Signed)
Has hematoma right groin, 3 by 4 inches, raised but fairly soft. Site held by [employer] RT times 20 minutes.

## 2012-08-27 NOTE — ED Notes (Signed)
Complains of itchy buttock.

## 2012-08-27 NOTE — ED Notes (Signed)
Pressure aorta is 154 69 MAP 100

## 2012-08-27 NOTE — ED Notes (Signed)
Pressure renal artery is 54/42 MAP 48

## 2012-08-27 NOTE — ED Notes (Signed)
Wife present. Eating crackers and taking fluids.

## 2012-08-27 NOTE — Progress Notes (Signed)
Utilization Review Completed.   Leny Morozov, RN, BSN Nurse Case Manager  [phone]  

## 2012-08-27 NOTE — ED Notes (Signed)
In nurses station. Report given to [facility].

## 2012-08-27 NOTE — Procedures (Signed)
Interventional Radiology Procedure Note  Procedure:  1.) Right renal angiogram 2.) Right renal artery stent Access Vessel:  Right CFA Complications: Small groin hematoma Recommendations: - Flat and leg straight x 6 hrs total.  Can elevate HOB 20 degrees after 4 hours - Load 300mg  Plavix, then 75 mg daily - Home meds - Monitor blood pressure closely today and tonight with telemetry - ADAT  Signed,  Sterling Big, MD Vascular & Interventional Radiologist [facility]

## 2012-08-27 NOTE — H&P (Signed)
Agree with PA note.    Signed,  Heath K. McCullough, MD Vascular & Interventional Radiologist [facility]  

## 2012-08-27 NOTE — Progress Notes (Signed)
Patient transported from Cath Lab from Cath procedure to 4706 as a Direct Admit. VSS. Admitting doctor visited patient to see how patient is doing post-procedure. Rec'd nursing report and was told patient's bed rest ends aroun 6:30pm. Patient's wife in room and has brought him a half of subway sandwich to eat later when he is able to eat. Vitals will be taken every hour to end of shift for last set of vital signs are done. Patient groin Level 2 and is tender to touch but doctor is aware and states groin still is within normal parameters. Patient complains of no pain or discomfort. Will continue to monitor to end of shift.

## 2012-08-28 ENCOUNTER — Encounter (HOSPITAL_COMMUNITY): Payer: Self-pay

## 2012-08-28 NOTE — Progress Notes (Signed)
PT d/c home with wife. D/c instructions and medications reviewed w/PT by charge nurse.

## 2012-08-28 NOTE — Progress Notes (Signed)
Per PA surg. Will d/c Pt today. No need to restrt will d/c Pt today.

## 2012-08-28 NOTE — Discharge Summary (Signed)
Physician Discharge Summary  Patient ID: Kevin Escobar MRN: [id number] DOB/AGE: 01-17-1941 72 y.o.  Admit date: 08/27/2012 Discharge date: 08/28/2012  Admission Diagnoses: Worsening renal function; medically refractory hypertension;                                         Right renal artery stenosis  Discharge Diagnoses: Rt renal artery stent placement  Active Problems: Bladder Ca; Prostate Ca; CKD; HTN; TIA; PVD  Discharged Condition: improved  Hospital Course: Pt with worsening chronic kidney disease. Medically refractory hypertension. Renal artery doppler at [facility] 04/2012 reveals Rt renal artery stenosis. Renal arteriogram with Right renal artery angioplasty/stent placement performed using CO2 in IR with Dr Malachy Moan 08/17/12. Pt tolerated well. Overnight stay was without event except for an episode of slight confusion. This has resolved and he is completely coherent this am. Urinating well; passing gas. Eating well without N/V. Ambulating. Slept well and resting comfortably now. I have examined pt and discussed pt status with Dr Archer Asa. Plan DC now. Pt agreeable Clinic will call pt with time and date with f/u with Dr Archer Asa Plavix 75 mg daily - Rx given   Consults: none  Significant Diagnostic Studies: CO2 Renal arteriogram  Treatments: Rt renal artery angioplasty/stent placement  Discharge Exam: Blood pressure 144/81, pulse 66, temperature 98 F (36.7 C), temperature source Axillary, resp. rate 16, height 5\' 11"  (1.803 m), weight 150 lb 12.8 oz (68.402 kg), SpO2 96.00%.  PE: A/O Appropriate Resting Heart: RRR Lungs: CTA Abd: soft; +BS; NT Rt groin: soft; ecchymotic at site; approx 2x3 cm Minimal 2cm hematoma - soft; NT Rt foot: 2+ pulses Extr: FROM  Results for orders placed during the hospital encounter of 08/27/12  APTT      Component Value Range   aPTT 33  24 - 37 seconds  BASIC METABOLIC PANEL      Component Value Range   Sodium 140   135 - 145 mEq/L   Potassium 3.4 (*) 3.5 - 5.1 mEq/L   Chloride 99  96 - 112 mEq/L   CO2 24  19 - 32 mEq/L   Glucose, Bld 100 (*) 70 - 99 mg/dL   BUN 37 (*) 6 - 23 mg/dL   Creatinine, Ser 2.84 (*) 0.50 - 1.35 mg/dL   Calcium 9.8  8.4 - 13.2 mg/dL   GFR calc non Af Amer 21 (*) >90 mL/min   GFR calc Af Amer 25 (*) >90 mL/min  CBC WITH DIFFERENTIAL      Component Value Range   WBC 10.1  4.0 - 10.5 K/uL   RBC 4.89  4.22 - 5.81 MIL/uL   Hemoglobin 14.3  13.0 - 17.0 g/dL   HCT 44.0  10.2 - 72.5 %   MCV 85.9  78.0 - 100.0 fL   MCH 29.2  26.0 - 34.0 pg   MCHC 34.0  30.0 - 36.0 g/dL   RDW 36.6  44.0 - 34.7 %   Platelets 229  150 - 400 K/uL   Neutrophils Relative 55  43 - 77 %   Neutro Abs 5.6  1.7 - 7.7 K/uL   Lymphocytes Relative 23  12 - 46 %   Lymphs Abs 2.4  0.7 - 4.0 K/uL   Monocytes Relative 8  3 - 12 %   Monocytes Absolute 0.8  0.1 - 1.0 K/uL   Eosinophils Relative 13 (*) 0 -  5 %   Eosinophils Absolute 1.3 (*) 0.0 - 0.7 K/uL   Basophils Relative 1  0 - 1 %   Basophils Absolute 0.1  0.0 - 0.1 K/uL  PROTIME-INR      Component Value Range   Prothrombin Time 12.7  11.6 - 15.2 seconds   INR 0.96  0.00 - 1.49  POCT ACTIVATED CLOTTING TIME      Component Value Range   Activated Clotting Time 187      Disposition: Pt with worsening CKD CO2 Renal Arteriogram reveals R RAS Angioplasty stent with Dr Malachy Moan 1/23 Pt tolerated procedure well Overnight stay was without event DC home now Continue home meds Rx: Plavix 75 mg daily #30 with 5 refills F/u appt with Dr Archer Asa in 2 weeks to include Renal doppler study Clinic will call pt with time and date Pt understands all dc instructions Call [phone] if questions  Discharge Orders    Future Orders Please Complete By Expires   IR Radiologist Eval & Mgmt  09/28/12 10/26/13   Questions: Responses:   Preferred Imaging Location? [facility]   Reason for exam: 2 week follow up with Archer Asa; need Renal  doppler US at time of visit to eval R Renal artery stent   Diet - low sodium heart healthy      Increase activity slowly      Discharge instructions      Comments:   Plavix 75 mg daily x 6 months; follow up with Dr Archer Asa in 2 weeks; Tammy will call pt with date and time - [phone]   Driving Restrictions      Comments:   No driving x 5 days   Lifting restrictions      Comments:   No lifting over 10 lbs x 5 days   Remove dressing in 24 hours      Scheduling Instructions:   May shower tomorrow; remove dressing then; replace with band aid daily x 5 days   Call MD for:  temperature >100.4      Call MD for:  persistant nausea and vomiting      Call MD for:  severe uncontrolled pain      Call MD for:  redness, tenderness, or signs of infection (pain, swelling, redness, odor or green/yellow discharge around incision site)      Scheduling Instructions:   Return or call [phone] if groin area has increased pain or swelling       Medication List     As of 08/28/2012 10:55 AM    TAKE these medications         acetaminophen 500 MG tablet   Commonly known as: TYLENOL   Take 500 mg by mouth every 4 (four) hours as needed. pain      amLODipine 10 MG tablet   Commonly known as: NORVASC   Take 10 mg by mouth 2 (two) times daily.      furosemide 40 MG tablet   Commonly known as: LASIX   Take 40 mg by mouth daily.      gabapentin 300 MG capsule   Commonly known as: NEURONTIN   Take 600 mg by mouth 3 (three) times daily.      metoprolol 50 MG tablet   Commonly known as: LOPRESSOR   Take 75 mg by mouth 2 (two) times daily.      sertraline 100 MG tablet   Commonly known as: ZOLOFT   Take 100 mg by mouth daily.  simvastatin 10 MG tablet   Commonly known as: ZOCOR   Take 10 mg by mouth at bedtime.      zolpidem 10 MG tablet   Commonly known as: AMBIEN   Take 5-10 mg by mouth at bedtime as needed. sleep         Signed: Mayte Diers A 08/28/2012, 10:55 AM

## 2012-08-31 NOTE — Discharge Summary (Signed)
Agree with PA D/C note.    Signed,  Sterling Big, MD Vascular & Interventional Radiologist [facility]

## 2012-09-08 ENCOUNTER — Other Ambulatory Visit (HOSPITAL_COMMUNITY): Payer: Self-pay | Admitting: Interventional Radiology

## 2012-09-08 DIAGNOSIS — I701 Atherosclerosis of renal artery: Secondary | ICD-10-CM

## 2012-09-17 ENCOUNTER — Other Ambulatory Visit: Payer: No Typology Code available for payment source

## 2012-09-30 ENCOUNTER — Ambulatory Visit
Admission: RE | Admit: 2012-09-30 | Discharge: 2012-09-30 | Disposition: A | Discharge status: Home / Self Care | Payer: No Typology Code available for payment source | Source: Ambulatory Visit | Attending: Radiology | Admitting: Radiology

## 2012-09-30 ENCOUNTER — Other Ambulatory Visit: Payer: Self-pay | Admitting: Radiology

## 2012-09-30 ENCOUNTER — Ambulatory Visit
Admission: RE | Admit: 2012-09-30 | Discharge: 2012-09-30 | Disposition: A | Discharge status: Home / Self Care | Payer: No Typology Code available for payment source | Source: Ambulatory Visit | Attending: Interventional Radiology | Admitting: Interventional Radiology

## 2012-09-30 VITALS — BP 135/72 | HR 62 | Temp 97.5°F | Resp 16

## 2012-09-30 DIAGNOSIS — I701 Atherosclerosis of renal artery: Secondary | ICD-10-CM

## 2012-09-30 LAB — CREATININE WITH EST GFR: GFR, Est Non African American: 24 mL/min — ABNORMAL LOW

## 2012-09-30 NOTE — Progress Notes (Signed)
Denies hematuria or other urinary problems.    No changes w/ B/P med's.    Has resumed usual activities.

## 2012-10-05 NOTE — Progress Notes (Signed)
Interventional Radiology Established Patient Clinic Note  Chief Complaint: Scheduled 34-month follow-up status post right renal artery stenting for renovascular hypertension   History: 72 year old male with a history of multifactorial chronic kidney disease with a progressive, slow decline in renal function. Additionally, he has difficult to control hypertension. He underwent a CO2 renal angiogram with stenting of high-grade ostial lesion of the right renal artery on August 27, 2012. He presents today for is 1 month is scheduled follow-up evaluation. Mr. Gales reports that he has had no trouble or complaints since this procedure. He denies groin pain, hematuria or back pain. He reports that this morning his systolic blood pressure was 122 mmHg.  In clinic, his blood pressure was 135/72 which is a significant improvement for him. He has had no modifications of his  hypertensive medicines yet.   He is taking Plavix as directed although he does continue to smoke approximately one pack per day. I re-emphasized the importance of smoking cessation with him.   Exam:  Vital signs: Temperature 97.5 degrees Fahrenheit; blood pressure 135/72; heart rate 62; respiratory rate 16; oxygen saturation 95%.  General: Well-nourished well developed elderly male and in no acute distress.  Cardiac: Regular rate rhythm  Lungs: Clear auscultation bilaterally   Assessment and Plan:  72 year-old male doing well status post CO2 renal angiogram and stenting of the right proximal renal artery. He shows a significant improvement in his blood pressure today.   1. Will obtain creatinine, BUN and GFR today to assess renal  function.   2. The patient will follow-up with Dr. Briant Cedar to evaluate for  potential reduction and his hypertensive that ascends if his blood  pressure remains within normal limits.   3. Continue Plavix   4. I encouraged smoking cessation again   5. Scheduled to return to see me in 6 months with a  repeat renal  ultrasound at that time.   Signed,  Sterling Big, MD  Vascular & Interventional Radiologist  [facility]

## 2013-02-02 DIAGNOSIS — I639 Cerebral infarction, unspecified: Secondary | ICD-10-CM

## 2013-02-02 HISTORY — DX: Cerebral infarction, unspecified: I63.9

## 2013-02-04 ENCOUNTER — Other Ambulatory Visit (HOSPITAL_COMMUNITY): Payer: Self-pay | Admitting: Interventional Radiology

## 2013-02-04 DIAGNOSIS — I150 Renovascular hypertension: Secondary | ICD-10-CM

## 2013-02-10 ENCOUNTER — Emergency Department (HOSPITAL_COMMUNITY)
Admission: EM | Admit: 2013-02-10 | Discharge: 2013-02-10 | Disposition: A | Discharge status: Home / Self Care | Payer: No Typology Code available for payment source | Attending: Emergency Medicine | Admitting: Emergency Medicine

## 2013-02-10 ENCOUNTER — Encounter (HOSPITAL_COMMUNITY): Payer: Self-pay | Admitting: Emergency Medicine

## 2013-02-10 DIAGNOSIS — Z7902 Long term (current) use of antithrombotics/antiplatelets: Secondary | ICD-10-CM | POA: Insufficient documentation

## 2013-02-10 DIAGNOSIS — R55 Syncope and collapse: Secondary | ICD-10-CM | POA: Insufficient documentation

## 2013-02-10 DIAGNOSIS — F3289 Other specified depressive episodes: Secondary | ICD-10-CM | POA: Insufficient documentation

## 2013-02-10 DIAGNOSIS — Z8546 Personal history of malignant neoplasm of prostate: Secondary | ICD-10-CM | POA: Insufficient documentation

## 2013-02-10 DIAGNOSIS — N189 Chronic kidney disease, unspecified: Secondary | ICD-10-CM | POA: Insufficient documentation

## 2013-02-10 DIAGNOSIS — Z8673 Personal history of transient ischemic attack (TIA), and cerebral infarction without residual deficits: Secondary | ICD-10-CM | POA: Insufficient documentation

## 2013-02-10 DIAGNOSIS — N289 Disorder of kidney and ureter, unspecified: Secondary | ICD-10-CM

## 2013-02-10 DIAGNOSIS — F329 Major depressive disorder, single episode, unspecified: Secondary | ICD-10-CM | POA: Insufficient documentation

## 2013-02-10 DIAGNOSIS — R413 Other amnesia: Secondary | ICD-10-CM | POA: Insufficient documentation

## 2013-02-10 DIAGNOSIS — Z8551 Personal history of malignant neoplasm of bladder: Secondary | ICD-10-CM | POA: Insufficient documentation

## 2013-02-10 DIAGNOSIS — Z8679 Personal history of other diseases of the circulatory system: Secondary | ICD-10-CM | POA: Insufficient documentation

## 2013-02-10 DIAGNOSIS — F17200 Nicotine dependence, unspecified, uncomplicated: Secondary | ICD-10-CM | POA: Insufficient documentation

## 2013-02-10 DIAGNOSIS — I129 Hypertensive chronic kidney disease with stage 1 through stage 4 chronic kidney disease, or unspecified chronic kidney disease: Secondary | ICD-10-CM | POA: Insufficient documentation

## 2013-02-10 DIAGNOSIS — Z79899 Other long term (current) drug therapy: Secondary | ICD-10-CM | POA: Insufficient documentation

## 2013-02-10 LAB — CBC WITH DIFFERENTIAL/PLATELET
Basophils Absolute: 0.1 10*3/uL (ref 0.0–0.1)
Eosinophils Relative: 7 % — ABNORMAL HIGH (ref 0–5)
HCT: 36.9 % — ABNORMAL LOW (ref 39.0–52.0)
Hemoglobin: 13.1 g/dL (ref 13.0–17.0)
Lymphocytes Relative: 7 % — ABNORMAL LOW (ref 12–46)
Lymphs Abs: 0.9 10*3/uL (ref 0.7–4.0)
MCV: 86.6 fL (ref 78.0–100.0)
Monocytes Relative: 6 % (ref 3–12)
Neutro Abs: 9.8 10*3/uL — ABNORMAL HIGH (ref 1.7–7.7)
RBC: 4.26 MIL/uL (ref 4.22–5.81)
WBC: 12.5 10*3/uL — ABNORMAL HIGH (ref 4.0–10.5)

## 2013-02-10 LAB — POCT I-STAT, CHEM 8
BUN: 42 mg/dL — ABNORMAL HIGH (ref 6–23)
Calcium, Ion: 1.19 mmol/L (ref 1.13–1.30)
Chloride: 108 mEq/L (ref 96–112)
Glucose, Bld: 91 mg/dL (ref 70–99)
HCT: 40 % (ref 39.0–52.0)
Potassium: 4.1 mEq/L (ref 3.5–5.1)

## 2013-02-10 NOTE — ED Notes (Signed)
PT was sitting at table at [employer]. 2 episodes of 2-3 second syncope episodes. No diaphoresis, nausea, vomiting. Bradycardia. CBG 111. VSS.

## 2013-02-10 NOTE — ED Notes (Signed)
Attempted to obtain e-signature.  Signature pad not working.  Pt verbalized understanding of d/c with no additional questions. VSS.  Pt wheeled out in wheelchair to vehicle.

## 2013-02-10 NOTE — ED Provider Notes (Signed)
History    CSN: [id number] Arrival date & time 02/10/13  1145  First MD Initiated Contact with Patient 02/10/13 1229     Chief Complaint  Patient presents with  . Loss of Consciousness   (Consider location/radiation/quality/duration/timing/severity/associated sxs/prior Treatment) HPI This 72 year old male has no history of syncope in the past has seen [facility] cardiology in the past hasn't [facility] Dr. had a witnessed episode for which the patient is now amnestic, he was sitting drinking coffee with friends at [place] this morning when he had sudden witnessed syncope without warning without trauma without fall he was assisted to the floor in a few minutes woke up was oriented to person and place with amnesia for the event he has no headache no neck pain back pain chest pain palpitation shortness breath abdominal pain nausea vomiting or change in speech vision swallowing or understanding or focal or lateralizing weakness or numbness or incoordination, he feels normal now and would like to go home today if possible. He has not been recently, there is no treatment prior to arrival. Past Medical History  Diagnosis Date  . Cancer of bladder 2010  . Cancer of prostate 1994  . Depression   . Chronic kidney disease   . Hypertension   . TIA (transient ischemic attack) 2008  . Peripheral vascular disease    Past Surgical History  Procedure Laterality Date  . Abdominal aortic aneurysm repair  1995  . Prostate surgery  1994  . Colon surgery  2010   History reviewed. No pertinent family history. History  Substance Use Topics  . Smoking status: Current Every Day Smoker -- 1.00 packs/day for 60 years  . Smokeless tobacco: Never Used  . Alcohol Use: No    Review of Systems 10 Systems reviewed and are negative for acute change except as noted in the HPI. Allergies  Other and Plaquenil  Home Medications   Current Outpatient Rx  Name  Route  Sig  Dispense  Refill  .  acetaminophen (TYLENOL) 500 MG tablet   Oral   Take 1,000 mg by mouth 2 (two) times daily. pain         . amLODipine (NORVASC) 10 MG tablet   Oral   Take 10 mg by mouth 2 (two) times daily.         . clopidogrel (PLAVIX) 75 MG tablet   Oral   Take 75 mg by mouth daily.         . diphenhydrAMINE (SOMINEX) 25 MG tablet   Oral   Take 25-50 mg by mouth 2 (two) times daily as needed for sleep.         . furosemide (LASIX) 40 MG tablet   Oral   Take 40 mg by mouth daily.         . metoprolol (LOPRESSOR) 50 MG tablet   Oral   Take 75 mg by mouth 2 (two) times daily.         . Podiatric Products (GOLD BOND FOOT) CREA   Apply externally   Apply 1 application topically daily as needed (Itching).         Marland Kitchen PRESCRIPTION MEDICATION   Oral   Take 1 tablet by mouth daily. Blood pressure         . simvastatin (ZOCOR) 10 MG tablet   Oral   Take 10 mg by mouth every morning.           BP 131/79  Pulse 62  Temp(Src) 97.6 F (  36.4 C) (Oral)  Resp 15  SpO2 96% Physical Exam  Nursing note and vitals reviewed. Constitutional:  Awake, alert, nontoxic appearance with baseline speech for patient.  HENT:  Head: Atraumatic.  Mouth/Throat: No oropharyngeal exudate.  Eyes: EOM are normal. Pupils are equal, round, and reactive to light. Right eye exhibits no discharge. Left eye exhibits no discharge.  Neck: Neck supple.  Cardiovascular: Normal rate and regular rhythm.   No murmur heard. Pulmonary/Chest: Effort normal and breath sounds normal. No stridor. No respiratory distress. He has no wheezes. He has no rales. He exhibits no tenderness.  Abdominal: Soft. Bowel sounds are normal. He exhibits no mass. There is no tenderness. There is no rebound.  Musculoskeletal: He exhibits no tenderness.  Baseline ROM, moves extremities with no obvious new focal weakness.  Lymphadenopathy:    He has no cervical adenopathy.  Neurological: He is alert.  Awake, alert, cooperative and  aware of situation; motor strength bilaterally; sensation normal to light touch bilaterally; peripheral visual fields full to confrontation; no facial asymmetry; tongue midline; major cranial nerves appear intact; no pronator drift, normal finger to nose bilaterally  Skin: No rash noted.  Psychiatric: He has a normal mood and affect.    ED Course  Procedures (including critical care time) Pt does not want Obs today, d/w [facility] rec decrease Lopressor to 25mg  bid and [facility] will mail Pt home monitor and f/u. Clinically doubt ACS.  ECG: Sinus bradycardia, ventricular rate 49, normal axis, borderline intraventricular conduction delay with QRS duration 106, borderline long QT interval with QTC495 ms, nonspecific ST changes, no comparison ECG available  Recheck: Patient and wife prefer discharge not Obs.  Labs Reviewed  CBC WITH DIFFERENTIAL - Abnormal; Notable for the following:    WBC 12.5 (*)    HCT 36.9 (*)    Neutrophils Relative % 79 (*)    Lymphocytes Relative 7 (*)    Eosinophils Relative 7 (*)    Neutro Abs 9.8 (*)    Eosinophils Absolute 0.9 (*)    All other components within normal limits  POCT I-STAT, CHEM 8 - Abnormal; Notable for the following:    BUN 42 (*)    Creatinine, Ser 3.00 (*)    All other components within normal limits   No results found. 1. Syncope   2. Renal insufficiency     MDM  I doubt any other EMC precluding discharge at this time including, but not necessarily limited to the following:ACS, CVA.  Hurman Horn, MD 02/13/13 2036

## 2013-02-20 ENCOUNTER — Inpatient Hospital Stay (HOSPITAL_COMMUNITY)
Admission: EM | Admit: 2013-02-20 | Discharge: 2013-02-26 | DRG: 065 | Disposition: A | Discharge status: Rehabilitation Facility | Payer: Medicare Other | Attending: Internal Medicine | Admitting: Internal Medicine

## 2013-02-20 ENCOUNTER — Encounter (HOSPITAL_COMMUNITY): Payer: Self-pay | Admitting: *Deleted

## 2013-02-20 ENCOUNTER — Emergency Department (HOSPITAL_COMMUNITY): Payer: Medicare Other

## 2013-02-20 DIAGNOSIS — F17200 Nicotine dependence, unspecified, uncomplicated: Secondary | ICD-10-CM | POA: Diagnosis present

## 2013-02-20 DIAGNOSIS — E785 Hyperlipidemia, unspecified: Secondary | ICD-10-CM | POA: Diagnosis present

## 2013-02-20 DIAGNOSIS — F329 Major depressive disorder, single episode, unspecified: Secondary | ICD-10-CM

## 2013-02-20 DIAGNOSIS — G459 Transient cerebral ischemic attack, unspecified: Secondary | ICD-10-CM

## 2013-02-20 DIAGNOSIS — I739 Peripheral vascular disease, unspecified: Secondary | ICD-10-CM

## 2013-02-20 DIAGNOSIS — F411 Generalized anxiety disorder: Secondary | ICD-10-CM | POA: Diagnosis present

## 2013-02-20 DIAGNOSIS — E876 Hypokalemia: Secondary | ICD-10-CM | POA: Diagnosis not present

## 2013-02-20 DIAGNOSIS — I10 Essential (primary) hypertension: Secondary | ICD-10-CM

## 2013-02-20 DIAGNOSIS — Z8551 Personal history of malignant neoplasm of bladder: Secondary | ICD-10-CM

## 2013-02-20 DIAGNOSIS — R279 Unspecified lack of coordination: Secondary | ICD-10-CM | POA: Diagnosis present

## 2013-02-20 DIAGNOSIS — N184 Chronic kidney disease, stage 4 (severe): Secondary | ICD-10-CM | POA: Diagnosis present

## 2013-02-20 DIAGNOSIS — Z8546 Personal history of malignant neoplasm of prostate: Secondary | ICD-10-CM

## 2013-02-20 DIAGNOSIS — F32A Depression, unspecified: Secondary | ICD-10-CM

## 2013-02-20 DIAGNOSIS — N183 Chronic kidney disease, stage 3 unspecified: Secondary | ICD-10-CM

## 2013-02-20 DIAGNOSIS — I129 Hypertensive chronic kidney disease with stage 1 through stage 4 chronic kidney disease, or unspecified chronic kidney disease: Secondary | ICD-10-CM | POA: Diagnosis present

## 2013-02-20 DIAGNOSIS — G8190 Hemiplegia, unspecified affecting unspecified side: Secondary | ICD-10-CM | POA: Diagnosis present

## 2013-02-20 DIAGNOSIS — I6330 Cerebral infarction due to thrombosis of unspecified cerebral artery: Principal | ICD-10-CM | POA: Diagnosis present

## 2013-02-20 DIAGNOSIS — F801 Expressive language disorder: Secondary | ICD-10-CM | POA: Diagnosis present

## 2013-02-20 DIAGNOSIS — I639 Cerebral infarction, unspecified: Secondary | ICD-10-CM

## 2013-02-20 DIAGNOSIS — Z23 Encounter for immunization: Secondary | ICD-10-CM

## 2013-02-20 DIAGNOSIS — F3289 Other specified depressive episodes: Secondary | ICD-10-CM | POA: Diagnosis present

## 2013-02-20 DIAGNOSIS — I69991 Dysphagia following unspecified cerebrovascular disease: Secondary | ICD-10-CM

## 2013-02-20 HISTORY — DX: Peripheral vascular disease, unspecified: I73.9

## 2013-02-20 LAB — CBC WITH DIFFERENTIAL/PLATELET
Basophils Absolute: 0.1 K/uL (ref 0.0–0.1)
Basophils Relative: 1 % (ref 0–1)
Eosinophils Absolute: 0.9 K/uL — ABNORMAL HIGH (ref 0.0–0.7)
Eosinophils Relative: 12 % — ABNORMAL HIGH (ref 0–5)
HCT: 37.6 % — ABNORMAL LOW (ref 39.0–52.0)
Hemoglobin: 12.8 g/dL — ABNORMAL LOW (ref 13.0–17.0)
Lymphocytes Relative: 24 % (ref 12–46)
Lymphs Abs: 1.8 K/uL (ref 0.7–4.0)
MCH: 29.8 pg (ref 26.0–34.0)
MCHC: 34 g/dL (ref 30.0–36.0)
MCV: 87.4 fL (ref 78.0–100.0)
Monocytes Absolute: 0.6 K/uL (ref 0.1–1.0)
Monocytes Relative: 8 % (ref 3–12)
Neutro Abs: 4.2 K/uL (ref 1.7–7.7)
Neutrophils Relative %: 56 % (ref 43–77)
Platelets: 172 K/uL (ref 150–400)
RBC: 4.3 MIL/uL (ref 4.22–5.81)
RDW: 13.7 % (ref 11.5–15.5)
WBC: 7.5 K/uL (ref 4.0–10.5)

## 2013-02-20 LAB — POCT I-STAT TROPONIN I: Troponin i, poc: 0.01 ng/mL (ref 0.00–0.08)

## 2013-02-20 LAB — URINE MICROSCOPIC-ADD ON

## 2013-02-20 LAB — POCT I-STAT, CHEM 8
Calcium, Ion: 1.24 mmol/L (ref 1.13–1.30)
Chloride: 107 mEq/L (ref 96–112)
HCT: 38 % — ABNORMAL LOW (ref 39.0–52.0)
Potassium: 3.2 mEq/L — ABNORMAL LOW (ref 3.5–5.1)
Sodium: 142 mEq/L (ref 135–145)

## 2013-02-20 LAB — COMPREHENSIVE METABOLIC PANEL
Albumin: 3.2 g/dL — ABNORMAL LOW (ref 3.5–5.2)
Alkaline Phosphatase: 62 U/L (ref 39–117)
BUN: 40 mg/dL — ABNORMAL HIGH (ref 6–23)
Calcium: 9.5 mg/dL (ref 8.4–10.5)
GFR calc non Af Amer: 19 mL/min — ABNORMAL LOW (ref >90)
Potassium: 3.2 mEq/L — ABNORMAL LOW (ref 3.5–5.1)
Total Bilirubin: 0.2 mg/dL — ABNORMAL LOW (ref 0.3–1.2)
Total Protein: 6.5 g/dL (ref 6.0–8.3)

## 2013-02-20 LAB — URINALYSIS, ROUTINE W REFLEX MICROSCOPIC
Leukocytes, UA: NEGATIVE
Nitrite: NEGATIVE
Specific Gravity, Urine: 1.01 (ref 1.005–1.030)
pH: 6 (ref 5.0–8.0)

## 2013-02-20 LAB — RAPID URINE DRUG SCREEN, HOSP PERFORMED: Barbiturates: NOT DETECTED

## 2013-02-20 MED ORDER — TRAZODONE HCL 50 MG PO TABS
50.0000 mg | ORAL_TABLET | Freq: Every evening | ORAL | Status: DC | PRN
  Administered 2013-02-22 – 2013-02-24 (×2): 100 mg via ORAL
  Filled 2013-02-20 (×2): qty 2

## 2013-02-20 MED ORDER — ACETAMINOPHEN 325 MG PO TABS
650.0000 mg | ORAL_TABLET | ORAL | Status: DC | PRN
  Administered 2013-02-24 (×2): 650 mg via ORAL
  Filled 2013-02-20 (×2): qty 2

## 2013-02-20 MED ORDER — SODIUM CHLORIDE 0.9 % IJ SOLN
3.0000 mL | Freq: Two times a day (BID) | INTRAMUSCULAR | Status: DC
  Administered 2013-02-21 – 2013-02-26 (×9): 3 mL via INTRAVENOUS

## 2013-02-20 MED ORDER — CLONIDINE HCL 0.1 MG PO TABS
0.1000 mg | ORAL_TABLET | Freq: Every day | ORAL | Status: DC
  Administered 2013-02-22 – 2013-02-25 (×4): 0.1 mg via ORAL
  Filled 2013-02-20 (×5): qty 1

## 2013-02-20 MED ORDER — SODIUM CHLORIDE 0.9 % IV SOLN: 250.0000 mL | INTRAVENOUS | Status: DC | PRN

## 2013-02-20 MED ORDER — SENNOSIDES-DOCUSATE SODIUM 8.6-50 MG PO TABS: 1.0000 | ORAL_TABLET | Freq: Every evening | ORAL | Status: DC | PRN

## 2013-02-20 MED ORDER — SIMVASTATIN 40 MG PO TABS
40.0000 mg | ORAL_TABLET | Freq: Every day | ORAL | Status: DC
  Administered 2013-02-21 – 2013-02-23 (×3): 40 mg via ORAL
  Filled 2013-02-20 (×4): qty 1

## 2013-02-20 MED ORDER — ASPIRIN 300 MG RE SUPP
300.0000 mg | Freq: Every day | RECTAL | Status: DC
  Filled 2013-02-20 (×2): qty 1

## 2013-02-20 MED ORDER — ASPIRIN 325 MG PO TABS
325.0000 mg | ORAL_TABLET | Freq: Every day | ORAL | Status: DC
  Filled 2013-02-20: qty 1

## 2013-02-20 MED ORDER — ACETAMINOPHEN 650 MG RE SUPP: 650.0000 mg | RECTAL | Status: DC | PRN

## 2013-02-20 MED ORDER — SODIUM CHLORIDE 0.9 % IJ SOLN: 3.0000 mL | INTRAMUSCULAR | Status: DC | PRN

## 2013-02-20 MED ORDER — ASPIRIN 325 MG PO TABS: 325.0000 mg | ORAL_TABLET | Freq: Once | ORAL | Status: DC

## 2013-02-20 NOTE — H&P (Signed)
[facility] History and Physical  Kevin Escobar  ZOX:[id number]  DOB: 09/30/1940  DOA: 02/20/2013  Referring physician: Dr Tanna Savoy PCP: No primary provider on file.   Chief Complaint: expressive aphasia and ataxia  HPI: Kevin Escobar is a 72 y.o. male with Past medical history of recurrent TIA, CKD, renal artery stenosis s/p stent placement, AAA repair in 1995, Hypertension presented with the c/o difficulty speaking and imbalance that started last night. The pt was at his baseline last night, when he was with his wife and suddenly he noted that he has difficulty expressing himself. That episode resolved. When he woke up this morning started having gradual progressing generalized weakness. At around 1:00 he had another episode of  difficulty speaking. This followed by right-sided weakness and gait ataxia which progress further. He decided to come to the ED. He has complained of fatigue since last few weeks. He denies any complaint of fever, chills, chest pain, shortness of breath, headache, blurry vision, palpitation, leg swelling, diarrhea, urinary symptoms, any bleeding anywhere. He says this episode is different from his prior TIA.  Review of Systems: as mentioned in the history of present illness.  A Comprehensive review of the other systems is negative.  Past Medical History  Diagnosis Date  . Cancer of bladder 2010  . Cancer of prostate 1994  . Depression   . Chronic kidney disease   . Hypertension   . TIA (transient ischemic attack) 2008  . Peripheral vascular disease    Past Surgical History  Procedure Laterality Date  . Abdominal aortic aneurysm repair  1995  . Prostate surgery  1994  . Colon surgery  2010   Social History:  reports that he has been smoking.  He has never used smokeless tobacco. He reports that he does not drink alcohol or use illicit drugs. Patient is coming from home. Patient can participate in ADLs.  Allergies  Allergen Reactions  .  Other Rash    "Cheerios cause me to break out--bumps all over my chest"  . Plaquenil (Hydroxychloroquine Sulfate) Rash    Severe rash    History reviewed. No pertinent family history.  Prior to Admission medications   Medication Sig Start Date End Date Taking? Authorizing Provider  acetaminophen (TYLENOL) 500 MG tablet Take 1,500 mg by mouth daily. pain   Yes Historical Provider, MD  amLODipine (NORVASC) 10 MG tablet Take 10 mg by mouth See admin instructions. Takes 1 tablet every day at noon and a 2nd tablet 2-3 times week (random days) in the evening   Yes Historical Provider, MD  cloNIDine (CATAPRES) 0.1 MG tablet Take 0.1 mg by mouth See admin instructions. Takes 1 tablet every day at noon and 2-3 times week (random days) takes a 2nd tablet in the evening   Yes Historical Provider, MD  diphenhydrAMINE (BENADRYL) 25 mg capsule Take 25-50 mg by mouth daily as needed for itching.   Yes Historical Provider, MD  furosemide (LASIX) 40 MG tablet Take 80 mg by mouth See admin instructions. Takes 2 tablets (80 mg) daily at noon and twice a week (random days) takes an extra tablet at 6pm   Yes Historical Provider, MD  metoprolol (LOPRESSOR) 100 MG tablet Take 25 mg by mouth daily with lunch.   Yes Historical Provider, MD  simvastatin (ZOCOR) 10 MG tablet Take 10 mg by mouth daily with lunch.    Yes Historical Provider, MD  traZODone (DESYREL) 50 MG tablet Take 50-100 mg by mouth at bedtime as needed  for sleep.   Yes Historical Provider, MD    Physical Exam: Filed Vitals:   02/20/13 1815 02/20/13 1816 02/20/13 1830 02/20/13 1900  BP: 152/79  135/80 140/74  Pulse: 59  57 57  Temp:  97.9 F (36.6 C)    TempSrc:      Resp: 17  12 13   Weight:      SpO2: 98%  99% 96%    General: Alert, Awake and Oriented to Time, Place and Person. Appear in mild distress Eyes: PERRL ENT: Oral Mucosa clear moist Neck: no JVD, no Carotid Bruits, no Stiffness Cardiovascular: S1 and S2 Present, Murmur faint  systolic in aortic area, Peripheral Pulses Present Respiratory: Clear to Auscultation , Bilateral Air entry equal and Decreased, no Crackles, wheezes Abdomen: Bowel Sound Present, Soft and Non tender,no Organomegaly Skin: no decubitus Ulcer Extremities: Pedal edema absent, calf tenderness absent. Neurologic: Mental status, Sensation, reflexes, Proprioception Grossly Unremarkable. Ataxia on standing. Swaying to the right side. No aphasia. Motor weakness on the right side.   Labs on Admission:  Basic Metabolic Panel:  Recent Labs Lab 02/20/13 1631 02/20/13 1654  NA 137 142  K 3.2* 3.2*  CL 102 107  CO2 21  --   GLUCOSE 122* 118*  BUN 40* 39*  CREATININE 2.96* 2.70*  CALCIUM 9.5  --    Liver Function Tests:  Recent Labs Lab 02/20/13 1631  AST 10  ALT 10  ALKPHOS 62  BILITOT 0.2*  PROT 6.5  ALBUMIN 3.2*   No results found for this basename: LIPASE, AMYLASE,  in the last 168 hours No results found for this basename: AMMONIA,  in the last 168 hours CBC:  Recent Labs Lab 02/20/13 1631 02/20/13 1654  WBC 7.5  --   NEUTROABS 4.2  --   HGB 12.8* 12.9*  HCT 37.6* 38.0*  MCV 87.4  --   PLT 172  --    Cardiac Enzymes: No results found for this basename: CKTOTAL, CKMB, CKMBINDEX, TROPONINI,  in the last 168 hours  BNP (last 3 results) No results found for this basename: PROBNP,  in the last 8760 hours CBG: No results found for this basename: GLUCAP,  in the last 168 hours  Radiological Exams on Admission: Ct Head Wo Contrast  02/20/2013   *RADIOLOGY REPORT*  Clinical Data: Right-sided weakness  CT HEAD WITHOUT CONTRAST  Technique:  Contiguous axial images were obtained from the base of the skull through the vertex without contrast.  Comparison: Brain MRI, 06/05/2011  Findings: The ventricles are normal in configuration.  There is ventricular enlargement, greater than sulcal enlargement, consistent with a predominance of central volume loss.  There is no hydrocephalus.   There are no parenchymal masses or mass effect.  There is an old left anterior basal ganglia lacunar infarct and small old deep white matter lacunar infarcts stable from the prior brain MRI. There is a small left parietal lobe infarct. Patchy white matter hypoattenuation noted bilaterally is consistent with moderate chronic microvascular ischemic change.  There is no evidence of a recent infarct.  There are no extra-axial masses or abnormal fluid collections.  There is no intracranial hemorrhage.  The visualized sinuses and mastoid air cells are clear.  IMPRESSION: No acute intracranial abnormalities.  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  Chronic microvascular ischemic change.   Original Report Authenticated By: Amie Portland, M.D.    EKG: Independently reviewed. No ST-T wave changes suggestive of ischemia  Assessment/Plan Principal Problem:   CVA (cerebral vascular accident) Active  Problems:   Chronic kidney disease   Hypertension   Peripheral vascular disease   1.  CVA: The patient has persistant gait ataxia, symptoms of onset last night. He also has a new right-sided weakness at present. Considering that the patient is more likely to have a subacute CVA. His CT scan of the head is negative for any acute infarct. Consulted neurology for further input, MRI of the head, carotid artery duplex and TTE. LIpid profile in AM. Speech and PTOT eval. Permissive hypertension. Increased Zocor dose and added Aspirin.  2. Hypertension Permissive hypertension at present. Holding norvasc Continuing Clonidine.  DVT Prophylaxis: mechanical compression device Nutrition: as per speech eval  Code Status: full    Author: Lynden Oxford, MD [facility] Pager: [phone] 02/20/2013 8:12 PM    If 7PM-7AM, please contact night-coverage [email or web address] Password [email or web address]

## 2013-02-20 NOTE — ED Provider Notes (Signed)
History    CSN: [id number] Arrival date & time 02/20/13  1551  First MD Initiated Contact with Patient 02/20/13 1555     Chief Complaint  Patient presents with  . Stroke symptoms    (Consider location/radiation/quality/duration/timing/severity/associated sxs/prior Treatment) HPI Comments: Pt w/ hx of TIA, prostate and bladder Ca, CKD, HTN, PVD now w/ right sided weakness. States yesterday evening at 1700 acute onset aphasia - lasting 5 minutes followed by right sided weakness, dysarthria and confusion. Fell x 2, hit head, no LOC, no syncope, chest pain, dyspnea, palpitations or HA. No diplopia or blurred vision, no fever or recent infection. Admits to generalized malaise and fatigue x several wks. States sx progressed today and wife concerned and called EMS. Glucose normal per EMS and vitals stable. States RUE and RLE weakness. Denies other sx.   Patient is a 72 y.o. male presenting with general illness. The history is provided by the patient. No language interpreter was used.  Illness Location:  CNS Quality:  Unilateral weakness Severity:  Moderate Onset quality:  Sudden Duration:  23 hours Timing:  Constant Progression:  Worsening Chronicity:  New Associated symptoms: no abdominal pain, no chest pain, no congestion, no cough, no diarrhea, no fever, no headaches, no nausea, no rash, no shortness of breath, no sore throat and no vomiting    Past Medical History  Diagnosis Date  . Cancer of bladder 2010  . Cancer of prostate 1994  . Depression   . Chronic kidney disease   . Hypertension   . TIA (transient ischemic attack) 2008  . Peripheral vascular disease    Past Surgical History  Procedure Laterality Date  . Abdominal aortic aneurysm repair  1995  . Prostate surgery  1994  . Colon surgery  2010   History reviewed. No pertinent family history. History  Substance Use Topics  . Smoking status: Current Every Day Smoker -- 1.00 packs/day for 60 years  . Smokeless tobacco:  Never Used  . Alcohol Use: No    Review of Systems  Constitutional: Negative for fever and chills.  HENT: Negative for congestion and sore throat.   Respiratory: Negative for cough and shortness of breath.   Cardiovascular: Negative for chest pain and leg swelling.  Gastrointestinal: Negative for nausea, vomiting, abdominal pain, diarrhea and constipation.  Genitourinary: Negative for dysuria and frequency.  Skin: Negative for color change and rash.  Neurological: Positive for facial asymmetry, speech difficulty and weakness. Negative for dizziness and headaches.  Psychiatric/Behavioral: Negative for confusion and agitation.  All other systems reviewed and are negative.    Allergies  Other and Plaquenil  Home Medications   Current Outpatient Rx  Name  Route  Sig  Dispense  Refill  . acetaminophen (TYLENOL) 500 MG tablet   Oral   Take 1,000 mg by mouth 2 (two) times daily. pain         . amLODipine (NORVASC) 10 MG tablet   Oral   Take 10 mg by mouth 2 (two) times daily.         . clopidogrel (PLAVIX) 75 MG tablet   Oral   Take 75 mg by mouth daily.         . diphenhydrAMINE (SOMINEX) 25 MG tablet   Oral   Take 25-50 mg by mouth 2 (two) times daily as needed for sleep.         . furosemide (LASIX) 40 MG tablet   Oral   Take 40 mg by mouth daily.         Marland Kitchen  metoprolol (LOPRESSOR) 50 MG tablet   Oral   Take 75 mg by mouth 2 (two) times daily.         . Podiatric Products (GOLD BOND FOOT) CREA   Apply externally   Apply 1 application topically daily as needed (Itching).         Marland Kitchen PRESCRIPTION MEDICATION   Oral   Take 1 tablet by mouth daily. Blood pressure         . simvastatin (ZOCOR) 10 MG tablet   Oral   Take 10 mg by mouth every morning.           BP 141/75  Pulse 68  Temp(Src) 98.8 F (37.1 C) (Oral)  Resp 20  Wt 150 lb (68.04 kg)  BMI 20.93 kg/m2  SpO2 100% Physical Exam  Constitutional: He is oriented to person, place, and  time. He appears well-developed and well-nourished. No distress.  HENT:  Head: Normocephalic and atraumatic.  Eyes: EOM are normal. Pupils are unequal.  Neck: Normal range of motion. Neck supple.  Cardiovascular: Normal rate and regular rhythm.   Pulmonary/Chest: Effort normal and breath sounds normal. No respiratory distress.  Abdominal: Soft. He exhibits no distension.  Musculoskeletal: Normal range of motion. He exhibits no edema.  Neurological: He is alert and oriented to person, place, and time. A cranial nerve deficit is present. No sensory deficit. He exhibits abnormal muscle tone. Coordination abnormal. GCS eye subscore is 4. GCS verbal subscore is 5. GCS motor subscore is 6.  Right sided lower facial droop Right pronator drift 4/5 strength w/ RUE grip, flexion/extension and abduction 4/5 strength RLE hip flexion  All others muscle groups 5/5.   Anisocoria left pupil 3mm, right 2mm - hx of left eye surgery.   Dysmetria - finger/nose RUE   Skin: Skin is warm and dry.  Psychiatric: He has a normal mood and affect. His behavior is normal.    ED Course  Procedures (including critical care time) Results for orders placed during the hospital encounter of 02/20/13  CBC WITH DIFFERENTIAL      Result Value Range   WBC 7.5  4.0 - 10.5 K/uL   RBC 4.30  4.22 - 5.81 MIL/uL   Hemoglobin 12.8 (*) 13.0 - 17.0 g/dL   HCT 16.1 (*) 09.6 - 04.5 %   MCV 87.4  78.0 - 100.0 fL   MCH 29.8  26.0 - 34.0 pg   MCHC 34.0  30.0 - 36.0 g/dL   RDW 40.9  81.1 - 91.4 %   Platelets 172  150 - 400 K/uL   Neutrophils Relative % 56  43 - 77 %   Neutro Abs 4.2  1.7 - 7.7 K/uL   Lymphocytes Relative 24  12 - 46 %   Lymphs Abs 1.8  0.7 - 4.0 K/uL   Monocytes Relative 8  3 - 12 %   Monocytes Absolute 0.6  0.1 - 1.0 K/uL   Eosinophils Relative 12 (*) 0 - 5 %   Eosinophils Absolute 0.9 (*) 0.0 - 0.7 K/uL   Basophils Relative 1  0 - 1 %   Basophils Absolute 0.1  0.0 - 0.1 K/uL  PROTIME-INR      Result  Value Range   Prothrombin Time 12.6  11.6 - 15.2 seconds   INR 0.96  0.00 - 1.49  APTT      Result Value Range   aPTT 34  24 - 37 seconds  COMPREHENSIVE METABOLIC PANEL      Result  Value Range   Sodium 137  135 - 145 mEq/L   Potassium 3.2 (*) 3.5 - 5.1 mEq/L   Chloride 102  96 - 112 mEq/L   CO2 21  19 - 32 mEq/L   Glucose, Bld 122 (*) 70 - 99 mg/dL   BUN 40 (*) 6 - 23 mg/dL   Creatinine, Ser 1.61 (*) 0.50 - 1.35 mg/dL   Calcium 9.5  8.4 - 09.6 mg/dL   Total Protein 6.5  6.0 - 8.3 g/dL   Albumin 3.2 (*) 3.5 - 5.2 g/dL   AST 10  0 - 37 U/L   ALT 10  0 - 53 U/L   Alkaline Phosphatase 62  39 - 117 U/L   Total Bilirubin 0.2 (*) 0.3 - 1.2 mg/dL   GFR calc non Af Amer 19 (*) >90 mL/min   GFR calc Af Amer 22 (*) >90 mL/min  URINE RAPID DRUG SCREEN (HOSP PERFORMED)      Result Value Range   Opiates NONE DETECTED  NONE DETECTED   Cocaine NONE DETECTED  NONE DETECTED   Benzodiazepines NONE DETECTED  NONE DETECTED   Amphetamines NONE DETECTED  NONE DETECTED   Tetrahydrocannabinol NONE DETECTED  NONE DETECTED   Barbiturates NONE DETECTED  NONE DETECTED  URINALYSIS, ROUTINE W REFLEX MICROSCOPIC      Result Value Range   Color, Urine YELLOW  YELLOW   APPearance CLEAR  CLEAR   Specific Gravity, Urine 1.010  1.005 - 1.030   pH 6.0  5.0 - 8.0   Glucose, UA NEGATIVE  NEGATIVE mg/dL   Hgb urine dipstick NEGATIVE  NEGATIVE   Bilirubin Urine NEGATIVE  NEGATIVE   Ketones, ur NEGATIVE  NEGATIVE mg/dL   Protein, ur 045 (*) NEGATIVE mg/dL   Urobilinogen, UA 0.2  0.0 - 1.0 mg/dL   Nitrite NEGATIVE  NEGATIVE   Leukocytes, UA NEGATIVE  NEGATIVE  URINE MICROSCOPIC-ADD ON      Result Value Range   Squamous Epithelial / LPF RARE  RARE   WBC, UA 0-2  <3 WBC/hpf  POCT I-STAT, CHEM 8      Result Value Range   Sodium 142  135 - 145 mEq/L   Potassium 3.2 (*) 3.5 - 5.1 mEq/L   Chloride 107  96 - 112 mEq/L   BUN 39 (*) 6 - 23 mg/dL   Creatinine, Ser 4.09 (*) 0.50 - 1.35 mg/dL   Glucose, Bld 811 (*)  70 - 99 mg/dL   Calcium, Ion 9.14  7.82 - 1.30 mmol/L   TCO2 23  0 - 100 mmol/L   Hemoglobin 12.9 (*) 13.0 - 17.0 g/dL   HCT 95.6 (*) 21.3 - 08.6 %  POCT I-STAT TROPONIN I      Result Value Range   Troponin i, poc 0.01  0.00 - 0.08 ng/mL   Comment 3            CT Head Wo Contrast (Final result)  Result time: 02/20/13 17:45:15    Final result by Rad Results In Interface (02/20/13 17:45:15)    Narrative:   *RADIOLOGY REPORT*  Clinical Data: Right-sided weakness  CT HEAD WITHOUT CONTRAST  Technique: Contiguous axial images were obtained from the base of the skull through the vertex without contrast.  Comparison: Brain MRI, 06/05/2011  Findings: The ventricles are normal in configuration. There is ventricular enlargement, greater than sulcal enlargement, consistent with a predominance of central volume loss. There is no hydrocephalus.  There are no parenchymal masses or mass effect.  There is an old left anterior basal ganglia lacunar infarct and small old deep white matter lacunar infarcts stable from the prior brain MRI. There is a small left parietal lobe infarct. Patchy white matter hypoattenuation noted bilaterally is consistent with moderate chronic microvascular ischemic change. There is no evidence of a recent infarct.  There are no extra-axial masses or abnormal fluid collections.  There is no intracranial hemorrhage.  The visualized sinuses and mastoid air cells are clear.  IMPRESSION: No acute intracranial abnormalities.  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 Chronic microvascular ischemic change.   Original Report Authenticated By: Amie Portland, M.D.         Date: 02/20/2013  Rate: 60  Rhythm: normal sinus rhythm  QRS Axis: normal  Intervals: normal  ST/T Wave abnormalities: normal  Conduction Disutrbances:none  Narrative Interpretation:   Old EKG Reviewed: unchanged     No results found. No diagnosis found.  MDM  Exam as above, NAD,  protecting airway. Concern for sub acute CVA - outside of TPA window. Glucose normal, CT head - NAICA - chronic microvascular ischemia, labs unremarkable. D/w neurology and pt admitted to medicine for subacute CVA. Admit in stable condition. No decline in status while in ED. Given ASA 325mg . ECG w/out acute ischemia or arrhythmia.   I have personally reviewed labs and imaging and considered in my MDM. Case d/w Dr Anitra Lauth    1. Chronic kidney disease, stage 3 (moderate)   2. Hypertension   3. TIA (transient ischemic attack)   4. Peripheral vascular disease      Audelia Hives, MD 02/20/13 1947  Audelia Hives, MD 02/20/13 1950

## 2013-02-20 NOTE — ED Notes (Signed)
Pt in via EMS c/o intermittent weakness and speech changes since last night, pt states symptoms first began last night while in the kitchen with wife, states he was sitting in the chair and had an episode where he couldn't speak or move, states his wife was speaking to him and he remember this and could understand her but he couldn't reply, pt states his wife helped him to bed because he was having trouble walking. Pt states during the night he woke up and fell twice while trying to walk, states he felt increased weakness in his right leg, pt admits to hitting head when he fell but denies LOC. During the day pt states symptoms have progressed, this afternoon he started noticing more speech changes, upon EMS arrival they noted weakness to patient right leg with right facial drop, state during transport patient speech became more garbled. Upon arrival speech began to clear again. Pt alert and oriented, answering questions appropriately.

## 2013-02-20 NOTE — Progress Notes (Signed)
Requested to be admitted for ? CVA/TIA, will signout for night team, Neuro on board. Still has R.sided weakness, CT head OK. Not TPA candidate.

## 2013-02-20 NOTE — ED Notes (Signed)
Returned from CT.

## 2013-02-20 NOTE — ED Provider Notes (Signed)
I saw and evaluated the patient, reviewed the resident's note and I agree with the findings and plan. I have reviewed EKG and agree with the resident interpretation.  you Patient symptoms typical of a stroke outside of any window symptoms have been ongoing for 24 hours. Patient has notable deficits on the right side. Head CT negative for bleed. Patient will be admitted for stroke workup  Gwyneth Sprout, MD 02/20/13 2339

## 2013-02-20 NOTE — ED Notes (Addendum)
Attempted to call report. Floor RN/buddy nurse, and charge RN unable to accept report.

## 2013-02-21 ENCOUNTER — Encounter (HOSPITAL_COMMUNITY): Payer: Self-pay | Admitting: Internal Medicine

## 2013-02-21 ENCOUNTER — Inpatient Hospital Stay (HOSPITAL_COMMUNITY): Payer: Medicare Other

## 2013-02-21 DIAGNOSIS — I10 Essential (primary) hypertension: Secondary | ICD-10-CM

## 2013-02-21 DIAGNOSIS — I739 Peripheral vascular disease, unspecified: Secondary | ICD-10-CM | POA: Diagnosis present

## 2013-02-21 DIAGNOSIS — I369 Nonrheumatic tricuspid valve disorder, unspecified: Secondary | ICD-10-CM

## 2013-02-21 DIAGNOSIS — R1310 Dysphagia, unspecified: Secondary | ICD-10-CM

## 2013-02-21 DIAGNOSIS — I6350 Cerebral infarction due to unspecified occlusion or stenosis of unspecified cerebral artery: Secondary | ICD-10-CM

## 2013-02-21 DIAGNOSIS — F329 Major depressive disorder, single episode, unspecified: Secondary | ICD-10-CM | POA: Diagnosis present

## 2013-02-21 HISTORY — DX: Peripheral vascular disease, unspecified: I73.9

## 2013-02-21 LAB — HEMOGLOBIN A1C
Hgb A1c MFr Bld: 5.6 % (ref <5.7)
Mean Plasma Glucose: 114 mg/dL (ref <117)

## 2013-02-21 LAB — GLUCOSE, CAPILLARY
Glucose-Capillary: 84 mg/dL (ref 70–99)
Glucose-Capillary: 164 mg/dL — ABNORMAL HIGH (ref 70–99)

## 2013-02-21 LAB — BASIC METABOLIC PANEL
CO2: 21 mEq/L (ref 19–32)
Calcium: 9.7 mg/dL (ref 8.4–10.5)
Glucose, Bld: 87 mg/dL (ref 70–99)
Potassium: 3.3 mEq/L — ABNORMAL LOW (ref 3.5–5.1)
Sodium: 139 mEq/L (ref 135–145)

## 2013-02-21 LAB — LIPID PANEL: HDL: 29 mg/dL — ABNORMAL LOW (ref >39)

## 2013-02-21 MED ORDER — LORAZEPAM 2 MG/ML IJ SOLN: 0.5000 mg | Freq: Once | INTRAMUSCULAR | Status: AC

## 2013-02-21 MED ORDER — PNEUMOCOCCAL VAC POLYVALENT 25 MCG/0.5ML IJ INJ
0.5000 mL | INJECTION | INTRAMUSCULAR | Status: AC
  Filled 2013-02-21: qty 0.5

## 2013-02-21 MED ORDER — HYDRALAZINE HCL 20 MG/ML IJ SOLN
10.0000 mg | Freq: Four times a day (QID) | INTRAMUSCULAR | Status: DC | PRN
  Administered 2013-02-21 – 2013-02-25 (×6): 10 mg via INTRAVENOUS
  Filled 2013-02-21 (×5): qty 1
  Filled 2013-02-21: qty 2

## 2013-02-21 MED ORDER — FUROSEMIDE 80 MG PO TABS: 80.0000 mg | ORAL_TABLET | ORAL | Status: DC

## 2013-02-21 MED ORDER — LORAZEPAM 2 MG/ML IJ SOLN
INTRAMUSCULAR | Status: AC
  Filled 2013-02-21: qty 1

## 2013-02-21 MED ORDER — LORAZEPAM 2 MG/ML IJ SOLN
0.5000 mg | Freq: Three times a day (TID) | INTRAMUSCULAR | Status: DC | PRN
  Administered 2013-02-21 – 2013-02-25 (×5): 0.5 mg via INTRAVENOUS
  Filled 2013-02-21 (×4): qty 1

## 2013-02-21 MED ORDER — POTASSIUM CHLORIDE 10 MEQ/100ML IV SOLN
10.0000 meq | INTRAVENOUS | Status: AC
  Administered 2013-02-21 (×2): 10 meq via INTRAVENOUS
  Filled 2013-02-21 (×3): qty 100

## 2013-02-21 MED ORDER — CLOPIDOGREL BISULFATE 75 MG PO TABS
75.0000 mg | ORAL_TABLET | Freq: Every day | ORAL | Status: DC
  Administered 2013-02-22 – 2013-02-26 (×5): 75 mg via ORAL
  Filled 2013-02-21 (×5): qty 1

## 2013-02-21 MED ORDER — FUROSEMIDE 10 MG/ML IJ SOLN
40.0000 mg | Freq: Once | INTRAMUSCULAR | Status: AC
  Administered 2013-02-21: 40 mg via INTRAVENOUS
  Filled 2013-02-21: qty 4

## 2013-02-21 NOTE — Evaluation (Signed)
Physical Therapy Evaluation Patient Details Name: Kevin Escobar MRN: [id number] DOB: July 03, 1941 Today's Date: 02/21/2013 Time: 4098-1191 PT Time Calculation (min): 22 min  PT Assessment / Plan / Recommendation History of Present Illness  Patient is a 72 yo male admitted with Rt-sided weakness, ataxia, and transient speech difficulties.  Patient with h/o recurrent TIA's.  Clinical Impression  Patient presents with problems listed below.  Will benefit from acute PT to address these issues prior to discharge.  Recommend Inpatient Rehab consult to provide comprehensive therapies to maximize level of independence.  Patient was independent pta.    PT Assessment  Patient needs continued PT services    Follow Up Recommendations  CIR;Supervision/Assistance - 24 hour    Does the patient have the potential to tolerate intense rehabilitation      Barriers to Discharge        Equipment Recommendations  Rolling walker with 5" wheels;Wheelchair (measurements PT);Wheelchair cushion (measurements PT)    Recommendations for Other Services Rehab consult   Frequency Min 4X/week    Precautions / Restrictions Precautions Precautions: Fall Restrictions Weight Bearing Restrictions: No   Pertinent Vitals/Pain       Mobility  Bed Mobility Bed Mobility: Rolling Right;Right Sidelying to Sit;Sitting - Scoot to Edge of Bed;Sit to Supine Rolling Right: 4: Min assist;With rail Right Sidelying to Sit: 3: Mod assist;HOB elevated;With rails Sitting - Scoot to Edge of Bed: 3: Mod assist (Max assist to scoot laterally up in bed) Sit to Supine: 3: Mod assist;HOB flat Details for Bed Mobility Assistance: Verbal cues for technique.  Assist to move trunk to sitting position.  Encouraged use of RUE to assist.  Patient sat EOB x 5 minutes for sitting balance.  Reports he is "feeling badly", so returned to supine.  Required max assist to scoot toward Rt side toward HOB in sitting. Transfers Transfers: Not  assessed Modified Rankin (Stroke Patients Only) Pre-Morbid Rankin Score: No symptoms Modified Rankin: Severe disability    Exercises     PT Diagnosis: Difficulty walking;Generalized weakness;Hemiplegia dominant side  PT Problem List: Decreased strength;Decreased activity tolerance;Decreased balance;Decreased mobility;Decreased coordination;Decreased knowledge of use of DME;Decreased knowledge of precautions PT Treatment Interventions: DME instruction;Gait training;Functional mobility training;Balance training;Neuromuscular re-education;Patient/family education     PT Goals(Current goals can be found in the care plan section) Acute Rehab PT Goals Patient Stated Goal: To walk PT Goal Formulation: With patient/family Time For Goal Achievement: 02/28/13 Potential to Achieve Goals: Good  Visit Information  Last PT Received On: 02/21/13 Assistance Needed: +2 History of Present Illness: Patient is a 72 yo male admitted with Rt-sided weakness, ataxia, and transient speech difficulties.  Patient with h/o recurrent TIA's.       Prior Functioning  Home Living Family/patient expects to be discharged to:: Inpatient rehab Living Arrangements: Spouse/significant other Available Help at Discharge: Family;Available 24 hours/day Type of Home: House Home Access: Stairs to enter [employer] of Steps: 4 Entrance Stairs-Rails: Right Home Layout: One level Home Equipment: Cane - quad;Cane - single point Prior Function Level of Independence: Independent Communication Communication: No difficulties    Cognition  Cognition Arousal/Alertness: Lethargic Behavior During Therapy: WFL for tasks assessed/performed Overall Cognitive Status: Within Functional Limits for tasks assessed    Extremity/Trunk Assessment Upper Extremity Assessment Upper Extremity Assessment: RUE deficits/detail RUE Deficits / Details: Strength 3-/5 RUE Coordination: decreased fine motor Lower Extremity  Assessment Lower Extremity Assessment: RLE deficits/detail RLE Deficits / Details: Strength 3+/5 RLE Coordination: decreased gross motor   Balance Balance Balance Assessed: Yes  Static Sitting Balance Static Sitting - Balance Support: Left upper extremity supported;Feet supported Static Sitting - Level of Assistance: 5: Stand by assistance Static Sitting - Comment/# of Minutes: 5 minutes Dynamic Sitting Balance Dynamic Sitting - Balance Support: No upper extremity supported;Feet supported Dynamic Sitting - Level of Assistance: 5: Stand by assistance Dynamic Sitting - Balance Activities: Lateral lean/weight shifting;Forward lean/weight shifting;Reaching for objects Dynamic Sitting - Comments: Patient attempted dynamic sitting activities.  Able to shift weight to left to reach object.  Difficulty shifting weight to right.  End of Session PT - End of Session Activity Tolerance: Patient limited by fatigue (Patient limited due to feeling poorly.) Patient left: in bed;with call bell/phone within reach;with bed alarm set;with family/visitor present Nurse Communication: Mobility status  GP     Vena Austria 02/21/2013, 3:13 PM Durenda Hurt. Renaldo Fiddler, [facility] Acute Rehab Services Pager [phone]

## 2013-02-21 NOTE — Consult Note (Signed)
Referring Physician: Dr. Janee Morn    Chief Complaint: right sided weakness with transient language impairment.  HPI:                                                                                                                                         Kevin Escobar is an 72 y.o. male with a past medical history significant for HTN, TIA, CKD, renal artery stenosis s/p stent placement, AAA repair in 1995, prostate and bladder cancer, admitted to [facility] with new onset right hemiparesis and 2 episodes of transient language impairment. His wife tells me that the day before admission he was not walking good and fell because he was not able to use his right leg properly. Then he awoke yesterday yesterday and started having gradual progressing generalized weakness. At around 1:00 he had another episode of difficulty speaking and more localized weakness involving the right hemibody and he agreed to come to the ED. CT brain upon arrival to ED showed no acute intracranial abnormality. On aspirin 325 mg daily and simvastatin 40 mg daily.   Date last known well: 02/19/13  Time last known well: uncertain tPA Given: no, late presentation NIHSS: unavailable upon admission MRS: 3   Past Medical History  Diagnosis Date  . Cancer of bladder 2010  . Cancer of prostate 1994  . Depression   . Chronic kidney disease   . Hypertension   . TIA (transient ischemic attack) 2008  . Peripheral vascular disease   . PVD (peripheral vascular disease) 02/21/2013    Past Surgical History  Procedure Laterality Date  . Abdominal aortic aneurysm repair  1995  . Prostate surgery  1994  . Colon surgery  2010    History reviewed. No pertinent family history. Social History:  reports that he has been smoking.  He has never used smokeless tobacco. He reports that he does not drink alcohol or use illicit drugs.  Allergies:  Allergies  Allergen Reactions  . Other Rash    "Cheerios cause me to break out--bumps all over  my chest"  . Plaquenil (Hydroxychloroquine Sulfate) Rash    Severe rash    Medications:                                                                                                                           I have reviewed the patient's current  medications.  ROS:                                                                                                                                       History obtained from the patient, family, and chart review  General ROS: negative for - chills, fatigue, fever, night sweats, weight gain or weight loss Psychological ROS: negative for - behavioral disorder, hallucinations, memory difficulties, mood swings or suicidal ideation Ophthalmic ROS: negative for - blurry vision, double vision, eye pain or loss of vision ENT ROS: negative for - epistaxis, nasal discharge, oral lesions, sore throat, tinnitus or vertigo Allergy and Immunology ROS: negative for - hives or itchy/watery eyes Hematological and Lymphatic ROS: negative for - bleeding problems, bruising or swollen lymph nodes Endocrine ROS: negative for - galactorrhea, hair pattern changes, polydipsia/polyuria or temperature intolerance Respiratory ROS: negative for - cough, hemoptysis, shortness of breath or wheezing Cardiovascular ROS: negative for - chest pain, dyspnea on exertion, edema or irregular heartbeat Gastrointestinal ROS: negative for - abdominal pain, diarrhea, hematemesis, nausea/vomiting or stool incontinence Genito-Urinary ROS: negative for - dysuria, hematuria, incontinence or urinary frequency/urgency Musculoskeletal ROS: negative for - joint swelling Neurological ROS: as noted in HPI Dermatological ROS: negative for rash and skin lesion changes    Physical exam: pleasant male in no apparent distress. Blood pressure 157/94, pulse 83, temperature 97.4 F (36.3 C), temperature source Oral, resp. rate 18, height 5\' 11"  (1.803 m), weight 67.7 kg (149 lb 4 oz), SpO2  96.00%. Head: normocephalic. Neck: supple, no bruits, no JVD. Cardiac: no murmurs. Lungs: clear. Abdomen: soft, no tender, no mass. Extremities: no edema.    Neurologic Examination:                                                                                                      Mental Status: Alert, awake, oriented x 4, thought content appropriate. Comprehension, naming, and repetition intact. Speech fluent without evidence of aphasia.  Able to follow 3 step commands without difficulty. Cranial Nerves: II: Discs flat bilaterally; Visual fields grossly normal, pupils equal, round, reactive to light and accommodation III,IV, VI: ptosis not present, extra-ocular motions intact bilaterally V,VII: smile symmetric, facial light touch sensation normal bilaterally VIII: hearing normal bilaterally IX,X: gag reflex present XI: bilateral shoulder shrug XII: midline tongue extension Motor: Significant for right hemiparesis. Tone and bulk:normal tone throughout; no atrophy noted Sensory: Pinprick and light touch intact throughout, bilaterally Deep Tendon Reflexes:  2 all over  Plantars: Right:  downgoing   Left: downgoing Cerebellar: normal finger-to-nose and  normal heel-to-shin test in the left. Can not perform in the right due to weakness. Gait:  No tested CV: pulses palpable throughout    Results for orders placed during the hospital encounter of 02/20/13 (from the past 48 hour(s))  CBC WITH DIFFERENTIAL     Status: Abnormal   Collection Time    02/20/13  4:31 PM      Result Value Range   WBC 7.5  4.0 - 10.5 K/uL   RBC 4.30  4.22 - 5.81 MIL/uL   Hemoglobin 12.8 (*) 13.0 - 17.0 g/dL   HCT 16.1 (*) 09.6 - 04.5 %   MCV 87.4  78.0 - 100.0 fL   MCH 29.8  26.0 - 34.0 pg   MCHC 34.0  30.0 - 36.0 g/dL   RDW 40.9  81.1 - 91.4 %   Platelets 172  150 - 400 K/uL   Neutrophils Relative % 56  43 - 77 %   Neutro Abs 4.2  1.7 - 7.7 K/uL   Lymphocytes Relative 24  12 - 46 %   Lymphs  Abs 1.8  0.7 - 4.0 K/uL   Monocytes Relative 8  3 - 12 %   Monocytes Absolute 0.6  0.1 - 1.0 K/uL   Eosinophils Relative 12 (*) 0 - 5 %   Eosinophils Absolute 0.9 (*) 0.0 - 0.7 K/uL   Basophils Relative 1  0 - 1 %   Basophils Absolute 0.1  0.0 - 0.1 K/uL  PROTIME-INR     Status: None   Collection Time    02/20/13  4:31 PM      Result Value Range   Prothrombin Time 12.6  11.6 - 15.2 seconds   INR 0.96  0.00 - 1.49  APTT     Status: None   Collection Time    02/20/13  4:31 PM      Result Value Range   aPTT 34  24 - 37 seconds  COMPREHENSIVE METABOLIC PANEL     Status: Abnormal   Collection Time    02/20/13  4:31 PM      Result Value Range   Sodium 137  135 - 145 mEq/L   Potassium 3.2 (*) 3.5 - 5.1 mEq/L   Chloride 102  96 - 112 mEq/L   CO2 21  19 - 32 mEq/L   Glucose, Bld 122 (*) 70 - 99 mg/dL   BUN 40 (*) 6 - 23 mg/dL   Creatinine, Ser 7.82 (*) 0.50 - 1.35 mg/dL   Calcium 9.5  8.4 - 95.6 mg/dL   Total Protein 6.5  6.0 - 8.3 g/dL   Albumin 3.2 (*) 3.5 - 5.2 g/dL   AST 10  0 - 37 U/L   ALT 10  0 - 53 U/L   Alkaline Phosphatase 62  39 - 117 U/L   Total Bilirubin 0.2 (*) 0.3 - 1.2 mg/dL   GFR calc non Af Amer 19 (*) >90 mL/min   GFR calc Af Amer 22 (*) >90 mL/min   Comment:            The eGFR has been calculated     using the CKD EPI equation.     This calculation has not been     validated in all clinical     situations.     eGFR's persistently     <90 mL/min signify     possible Chronic Kidney Disease.  POCT I-STAT TROPONIN  I     Status: None   Collection Time    02/20/13  4:52 PM      Result Value Range   Troponin i, poc 0.01  0.00 - 0.08 ng/mL   Comment 3            Comment: Due to the release kinetics of cTnI,     a negative result within the first hours     of the onset of symptoms does not rule out     myocardial infarction with certainty.     If myocardial infarction is still suspected,     repeat the test at appropriate intervals.  POCT I-STAT, CHEM 8      Status: Abnormal   Collection Time    02/20/13  4:54 PM      Result Value Range   Sodium 142  135 - 145 mEq/L   Potassium 3.2 (*) 3.5 - 5.1 mEq/L   Chloride 107  96 - 112 mEq/L   BUN 39 (*) 6 - 23 mg/dL   Creatinine, Ser 1.61 (*) 0.50 - 1.35 mg/dL   Glucose, Bld 096 (*) 70 - 99 mg/dL   Calcium, Ion 0.45  4.09 - 1.30 mmol/L   TCO2 23  0 - 100 mmol/L   Hemoglobin 12.9 (*) 13.0 - 17.0 g/dL   HCT 81.1 (*) 91.4 - 78.2 %  URINE RAPID DRUG SCREEN (HOSP PERFORMED)     Status: None   Collection Time    02/20/13  6:03 PM      Result Value Range   Opiates NONE DETECTED  NONE DETECTED   Cocaine NONE DETECTED  NONE DETECTED   Benzodiazepines NONE DETECTED  NONE DETECTED   Amphetamines NONE DETECTED  NONE DETECTED   Tetrahydrocannabinol NONE DETECTED  NONE DETECTED   Barbiturates NONE DETECTED  NONE DETECTED   Comment:            DRUG SCREEN FOR MEDICAL PURPOSES     ONLY.  IF CONFIRMATION IS NEEDED     FOR ANY PURPOSE, NOTIFY LAB     WITHIN 5 DAYS.                LOWEST DETECTABLE LIMITS     FOR URINE DRUG SCREEN     Drug Class       Cutoff (ng/mL)     Amphetamine      1000     Barbiturate      200     Benzodiazepine   200     Tricyclics       300     Opiates          300     Cocaine          300     THC              50  URINALYSIS, ROUTINE W REFLEX MICROSCOPIC     Status: Abnormal   Collection Time    02/20/13  6:04 PM      Result Value Range   Color, Urine YELLOW  YELLOW   APPearance CLEAR  CLEAR   Specific Gravity, Urine 1.010  1.005 - 1.030   pH 6.0  5.0 - 8.0   Glucose, UA NEGATIVE  NEGATIVE mg/dL   Hgb urine dipstick NEGATIVE  NEGATIVE   Bilirubin Urine NEGATIVE  NEGATIVE   Ketones, ur NEGATIVE  NEGATIVE mg/dL   Protein, ur 956 (*) NEGATIVE mg/dL   Urobilinogen, UA 0.2  0.0 -  1.0 mg/dL   Nitrite NEGATIVE  NEGATIVE   Leukocytes, UA NEGATIVE  NEGATIVE  URINE MICROSCOPIC-ADD ON     Status: None   Collection Time    02/20/13  6:04 PM      Result Value Range    Squamous Epithelial / LPF RARE  RARE   WBC, UA 0-2  <3 WBC/hpf  LIPID PANEL     Status: Abnormal   Collection Time    02/21/13  4:35 AM      Result Value Range   Cholesterol 155  0 - 200 mg/dL   Triglycerides 295 (*) <150 mg/dL   HDL 29 (*) >62 mg/dL   Total CHOL/HDL Ratio 5.3     VLDL 45 (*) 0 - 40 mg/dL   LDL Cholesterol 81  0 - 99 mg/dL   Comment:            Total Cholesterol/HDL:CHD Risk     Coronary Heart Disease Risk Table                         Men   Women      1/2 Average Risk   3.4   3.3      Average Risk       5.0   4.4      2 X Average Risk   9.6   7.1      3 X Average Risk  23.4   11.0                Use the calculated Patient Ratio     above and the CHD Risk Table     to determine the patient's CHD Risk.                ATP III CLASSIFICATION (LDL):      <100     mg/dL   Optimal      130-865  mg/dL   Near or Above                        Optimal      130-159  mg/dL   Borderline      784-696  mg/dL   High      >295     mg/dL   Very High  GLUCOSE, CAPILLARY     Status: None   Collection Time    02/21/13  6:41 AM      Result Value Range   Glucose-Capillary 83  70 - 99 mg/dL  BASIC METABOLIC PANEL     Status: Abnormal   Collection Time    02/21/13  8:29 AM      Result Value Range   Sodium 139  135 - 145 mEq/L   Potassium 3.3 (*) 3.5 - 5.1 mEq/L   Chloride 104  96 - 112 mEq/L   CO2 21  19 - 32 mEq/L   Glucose, Bld 87  70 - 99 mg/dL   BUN 38 (*) 6 - 23 mg/dL   Creatinine, Ser 2.84 (*) 0.50 - 1.35 mg/dL   Calcium 9.7  8.4 - 13.2 mg/dL   GFR calc non Af Amer 20 (*) >90 mL/min   GFR calc Af Amer 23 (*) >90 mL/min   Comment:            The eGFR has been calculated     using the CKD EPI equation.     This calculation has not been  validated in all clinical     situations.     eGFR's persistently     <90 mL/min signify     possible Chronic Kidney Disease.  GLUCOSE, CAPILLARY     Status: None   Collection Time    02/21/13 11:26 AM      Result Value  Range   Glucose-Capillary 84  70 - 99 mg/dL   Comment 1 Notify RN     Ct Head Wo Contrast  02/20/2013   *RADIOLOGY REPORT*  Clinical Data: Right-sided weakness  CT HEAD WITHOUT CONTRAST  Technique:  Contiguous axial images were obtained from the base of the skull through the vertex without contrast.  Comparison: Brain MRI, 06/05/2011  Findings: The ventricles are normal in configuration.  There is ventricular enlargement, greater than sulcal enlargement, consistent with a predominance of central volume loss.  There is no hydrocephalus.  There are no parenchymal masses or mass effect.  There is an old left anterior basal ganglia lacunar infarct and small old deep white matter lacunar infarcts stable from the prior brain MRI. There is a small left parietal lobe infarct. Patchy white matter hypoattenuation noted bilaterally is consistent with moderate chronic microvascular ischemic change.  There is no evidence of a recent infarct.  There are no extra-axial masses or abnormal fluid collections.  There is no intracranial hemorrhage.  The visualized sinuses and mastoid air cells are clear.  IMPRESSION: No acute intracranial abnormalities.  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  Chronic microvascular ischemic change.   Original Report Authenticated By: Amie Portland, M.D.   Dg Chest Port 1 View  02/21/2013   *RADIOLOGY REPORT*  Clinical Data: Short of breath  PORTABLE CHEST - 1 VIEW  Comparison: None  Findings: The heart size and mediastinal contours are within normal limits.  Both lungs are clear.  The visualized skeletal structures are unremarkable.  IMPRESSION: No active cardiopulmonary abnormalities.   Original Report Authenticated By: Signa Kell, M.D.       Assessment: 72 y.o. male with new onset right hemiparesis and 2 episodes of transient dysphasia.  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 Failed aspirin. Consider switching to plavix. Continue statin. Complete stroke work up. Stroke team to resume care in  the morning.   Stroke Risk Factors - age, HTN,TIA  Plan: 1. HgbA1c, fasting lipid panel 2. MRI, MRA  of the brain without contrast 3. Echocardiogram 4. Carotid dopplers 5. Prophylactic therapy-Antiplatelet med: Plavix - dose 75 mg 6. Risk factor modification 7. Telemetry monitoring 8. Frequent neuro checks 9. PT/OT SLP  Wyatt Portela, MD [facility] [phone]

## 2013-02-21 NOTE — Progress Notes (Signed)
Got through 1.5 bags of IV potassium out of 3 ordered before patient requested we stop the infusion. He said the potassium stung too much , even after i reduced the rate. Will let MD know

## 2013-02-21 NOTE — Progress Notes (Signed)
VASCULAR LAB PRELIMINARY  PRELIMINARY  PRELIMINARY  PRELIMINARY  Carotid Dopplers completed.    Preliminary report:  There is 0-39% ICA stenosis.  Vertebral artery flow is antegrade.  Yulitza Shorts, RVT 02/21/2013, 11:53 AM

## 2013-02-21 NOTE — Progress Notes (Signed)
2 D echo completed 

## 2013-02-21 NOTE — Progress Notes (Signed)
[facility] PROGRESS NOTE  Kevin Escobar ZOX:[id number] DOB: 1941/05/30 DOA: 02/20/2013 PCP: No primary provider on file.  Assessment/Plan: #1. Right-sided hemiplegia/transient dysphasia Likely secondary to probable CVA. Patient was on aspirin prior to admission. Head CT is negative. MRI is pending. 2-D echo pending. Check a FLP. Carotid Dopplers preliminary result with no significant ICA stenosis. Will change patient from aspirin to Plavix per neurology recommendations. PT/OT/ST. Risk factor modification. Neurology following and appreciate input and recommendations.  #2 hypertension Permissive hypertension. Norvasc on hold. Continue clonidine.  #3 chronic kidney disease Stable. Once patient is tolerating her diet may need to resume Lasix.  #4 peripheral vascular disease status post renal stent and AAA repair Stable. Patient has been changed from aspirin to Plavix. Follow.  #5 depression Stable.  #6 hyperlipidemia Continue Zocor.   #7 prophylaxis SCDs for DVT prophylaxis.  Code Status: Full Family Communication: Updated patient. No family at bedside. Disposition Plan: Home vs SNF when medically stable.   Consultants:  Neurology: Dr Cyril Mourning 02/21/13  Procedures:  MRI pending  CT head 02/20/13  CXR 02/21/13  2-D echo 02/21/2013  Carotid Dopplers 02/21/2013  Antibiotics:  None  HPI/Subjective: Patient with RLE weakness.  Objective: Filed Vitals:   02/21/13 0026 02/21/13 0242 02/21/13 0639 02/21/13 0802  BP: 143/93 167/92 166/99 151/74  Pulse: 71 82 77 73  Temp: 98.1 F (36.7 C) 97.7 F (36.5 C) 98.1 F (36.7 C) 98.2 F (36.8 C)  TempSrc: Oral Oral Oral Oral  Resp: 18 18 18 18   Height:      Weight:      SpO2: 98% 99% 97% 97%    Intake/Output Summary (Last 24 hours) at 02/21/13 0943 Last data filed at 02/20/13 2252  Gross per 24 hour  Intake      0 ml  Output    250 ml  Net   -250 ml   Filed Weights   02/20/13 1604 02/20/13 2054   Weight: 68.04 kg (150 lb) 67.7 kg (149 lb 4 oz)    Exam:   General:  nad  Cardiovascular: rrr  Respiratory: Left basilar crackles. No wheezing  Abdomen: Soft/NT/ND/+BS  Neuro: RLE weakness. RUE weakness. No aphasia  Data Reviewed: Basic Metabolic Panel:  Recent Labs Lab 02/20/13 1631 02/20/13 1654 02/21/13 0829  NA 137 142 139  K 3.2* 3.2* 3.3*  CL 102 107 104  CO2 21  --  21  GLUCOSE 122* 118* 87  BUN 40* 39* 38*  CREATININE 2.96* 2.70* 2.88*  CALCIUM 9.5  --  9.7   Liver Function Tests:  Recent Labs Lab 02/20/13 1631  AST 10  ALT 10  ALKPHOS 62  BILITOT 0.2*  PROT 6.5  ALBUMIN 3.2*   No results found for this basename: LIPASE, AMYLASE,  in the last 168 hours No results found for this basename: AMMONIA,  in the last 168 hours CBC:  Recent Labs Lab 02/20/13 1631 02/20/13 1654  WBC 7.5  --   NEUTROABS 4.2  --   HGB 12.8* 12.9*  HCT 37.6* 38.0*  MCV 87.4  --   PLT 172  --    Cardiac Enzymes: No results found for this basename: CKTOTAL, CKMB, CKMBINDEX, TROPONINI,  in the last 168 hours BNP (last 3 results) No results found for this basename: PROBNP,  in the last 8760 hours CBG:  Recent Labs Lab 02/21/13 0641  GLUCAP 83    No results found for this or any previous visit (from the past 240 hour(s)).  Studies: Ct Head Wo Contrast  02/20/2013   *RADIOLOGY REPORT*  Clinical Data: Right-sided weakness  CT HEAD WITHOUT CONTRAST  Technique:  Contiguous axial images were obtained from the base of the skull through the vertex without contrast.  Comparison: Brain MRI, 06/05/2011  Findings: The ventricles are normal in configuration.  There is ventricular enlargement, greater than sulcal enlargement, consistent with a predominance of central volume loss.  There is no hydrocephalus.  There are no parenchymal masses or mass effect.  There is an old left anterior basal ganglia lacunar infarct and small old deep white matter lacunar infarcts stable from  the prior brain MRI. There is a small left parietal lobe infarct. Patchy white matter hypoattenuation noted bilaterally is consistent with moderate chronic microvascular ischemic change.  There is no evidence of a recent infarct.  There are no extra-axial masses or abnormal fluid collections.  There is no intracranial hemorrhage.  The visualized sinuses and mastoid air cells are clear.  IMPRESSION: No acute intracranial abnormalities.  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  Chronic microvascular ischemic change.   Original Report Authenticated By: Amie Portland, M.D.    Scheduled Meds: . aspirin  300 mg Rectal Daily   Or  . aspirin  325 mg Oral Daily  . aspirin  325 mg Oral Once  . cloNIDine  0.1 mg Oral Q1200  . [START ON 02/22/2013] pneumococcal 23 valent vaccine  0.5 mL Intramuscular Tomorrow-1000  . potassium chloride  10 mEq Intravenous Q1 Hr x 3  . simvastatin  40 mg Oral q1800  . sodium chloride  3 mL Intravenous Q12H   Continuous Infusions:   Principal Problem:   CVA (cerebral vascular accident) Active Problems:   Chronic kidney disease   Hypertension   Peripheral vascular disease   Depression   PVD (peripheral vascular disease)    Time spent: > 35 mins    [facility]  [facility] Pager [phone]. If 7PM-7AM, please contact night-coverage at [email or web address], password [facility] 02/21/2013, 9:43 AM  LOS: 1 day

## 2013-02-21 NOTE — Progress Notes (Signed)
Per Mr [name] his AAA repair is not MRI safe. Pt states he had a MRI prior and did have difficulties after MRI. Pt says a MRI safe graft was supposed to been placed but unfortunately my mistake he received an unsafe device. Pt appears to be knowledgeable about the information and the prior happenings.  This surg was done at [facility]. I do not have the make and model of this graft available to me.   Pt is refusing exam due to that. Paulino Rily has been made aware this test was not completed. The graft can be seen on a KUB in 2009 in canopy pacs. Pt also has a renal stent that has been placed beginning of 2014. This has not been cleared yet being pt is refusing b/c of AAA graft. I will however follow up with Dr. Archer Asa who placed the renal stent as he is on service this w/e just in case. Please call with any questions or concerns ext [phone]

## 2013-02-21 NOTE — Progress Notes (Signed)
I spoke with Kevin Escobar and reassured him that his stentgraft is MRI safe.  He was only concerned that it had made for uninterpretable images on a prior MRI.  The prior study was likely of the abdomen.  I explained that it will have no ill effect on his MRI of the brain.  He understands and is willing to proceed.   Signed,  Sterling Big, MD [facility]

## 2013-02-21 NOTE — Evaluation (Signed)
Clinical/Bedside Swallow Evaluation Patient Details  Name: Kevin Escobar MRN: [id number] Date of Birth: 12/26/1940  Today's Date: 02/21/2013 Time: 1150-1219 SLP Time Calculation (min): 29 min  Past Medical History:  Past Medical History  Diagnosis Date  . Cancer of bladder 2010  . Cancer of prostate 1994  . Depression   . Chronic kidney disease   . Hypertension   . TIA (transient ischemic attack) 2008  . Peripheral vascular disease   . PVD (peripheral vascular disease) 02/21/2013   Past Surgical History:  Past Surgical History  Procedure Laterality Date  . Abdominal aortic aneurysm repair  1995  . Prostate surgery  1994  . Colon surgery  2010   HPI:  Kevin Escobar is an 72 y.o. male with a past medical history significant for HTN, TIA, CKD, renal artery stenosis s/p stent placement, AAA repair in 1995, prostate and bladder cancer, admitted to [facility] with new onset right hemiparesis and 2 episodes of transient language impairment. BSE ordered per stroke protocol.    Assessment / Plan / Recommendation Clinical Impression  Min to moderate oral dysphagia marked by  poor oral awareness with anterior spillage on right with puree and solids and slow, deliberate mastication with increased WOB and decreased reserve with regular solids.  Pharyngeal dysphagia suspected with immediate wet cough and wet vocal quality s/p swallow of thin water by cup due to too large of amount.  Mulitple swallows noted each sip with trials of nectar thick liquids indicating pharyngeal weakness.  Small, controlled cup sips followed by instructed throat clear effective in reducing s/s of penetration vs. aspiration.  Diagnostic Treatment completed following evaluation focusing on providing patient and caregivers education on recommended swallow strategies to increase safety.  Recommend to proceed with dysphagia 2 ( finely chopped) and thin liquid by cup sips only with aspiration precautions.  Recommend full  supervision with all meals to provide necessary cues and assist as patient noted to fatigue easily.  ST to follow closely in acute care setting for diet tolerance and possible advancement.     Aspiration Risk  Moderate    Diet Recommendation Dysphagia 2 (Fine chop);Thin liquid   Liquid Administration via: Cup;No straw Medication Administration: Whole meds with puree Supervision: Patient able to self feed;Full supervision/cueing for compensatory strategies Compensations: Slow rate;Small sips/bites;Clear throat intermittently;Hard cough after swallow Postural Changes and/or Swallow Maneuvers: Seated upright 90 degrees;Upright 30-60 min after meal    Other  Recommendations Oral Care Recommendations: Oral care BID Other Recommendations: Clarify dietary restrictions   Follow Up Recommendations   (TBD)    Frequency and Duration min 2x/week  2 weeks       SLP Swallow Goals Patient will utilize recommended strategies during swallow to increase swallowing safety with: Minimal assistance   Swallow Study Prior Functional Status   Lives at home with spouse no history of dysphagia     General Date of Onset: 02/20/13 HPI: Kevin Escobar is an 71 y.o. male with a past medical history significant for HTN, TIA, CKD, renal artery stenosis s/p stent placement, AAA repair in 1995, prostate and bladder cancer, admitted to [facility] with new onset right hemiparesis and 2 episodes of transient language impairment. Type of Study: Bedside swallow evaluation Diet Prior to this Study: NPO Temperature Spikes Noted: No Respiratory Status: Room air History of Recent Intubation: No Behavior/Cognition: Alert;Cooperative;Distractible;Pleasant mood;Requires cueing Oral Cavity - Dentition: Adequate natural dentition Self-Feeding Abilities: Needs assist;Able to feed self Patient Positioning: Upright in bed Baseline Vocal Quality: Clear  Volitional Cough: Congested;Wet Volitional Swallow: Able to elicit     Oral/Motor/Sensory Function Overall Oral Motor/Sensory Function: Impaired Labial ROM: Reduced right Labial Symmetry: Within Functional Limits Labial Strength: Reduced Labial Sensation: Reduced Lingual ROM: Within Functional Limits Lingual Symmetry: Within Functional Limits Lingual Strength: Within Functional Limits Lingual Sensation: Within Functional Limits Facial ROM: Reduced right Facial Symmetry: Right droop Facial Strength: Reduced Facial Sensation: Reduced Velum: Within Functional Limits Mandible: Within Functional Limits   Ice Chips Ice chips: Within functional limits   Thin Liquid Thin Liquid: Impaired Presentation: Cup;Spoon Pharyngeal  Phase Impairments: Suspected delayed Swallow;Wet Vocal Quality Other Comments: immediate cough with wet vocal quality s/p swallow of thin water by cup due to too large of a sip    Nectar Thick Nectar Thick Liquid: Impaired Presentation: Cup;Spoon Pharyngeal Phase Impairments: Suspected delayed Swallow;Multiple swallows   Honey Thick Honey Thick Liquid: Not tested   Puree Puree: Within functional limits Presentation: Self Fed;Spoon   Solid   GO    Solid: Impaired Oral Phase Impairments: Impaired anterior to posterior transit;Reduced labial seal;Poor awareness of bolus Oral Phase Functional Implications: Oral residue      Moreen Fowler MS, CCC-SLP [phone] [facility] 02/21/2013,12:38 PM

## 2013-02-22 ENCOUNTER — Inpatient Hospital Stay (HOSPITAL_COMMUNITY): Payer: Medicare Other

## 2013-02-22 DIAGNOSIS — F329 Major depressive disorder, single episode, unspecified: Secondary | ICD-10-CM

## 2013-02-22 LAB — GLUCOSE, CAPILLARY
Glucose-Capillary: 157 mg/dL — ABNORMAL HIGH (ref 70–99)
Glucose-Capillary: 124 mg/dL — ABNORMAL HIGH (ref 70–99)
Glucose-Capillary: 117 mg/dL — ABNORMAL HIGH (ref 70–99)

## 2013-02-22 LAB — BASIC METABOLIC PANEL
Calcium: 10.2 mg/dL (ref 8.4–10.5)
GFR calc Af Amer: 25 mL/min — ABNORMAL LOW (ref >90)
GFR calc non Af Amer: 22 mL/min — ABNORMAL LOW (ref >90)
Potassium: 3.1 mEq/L — ABNORMAL LOW (ref 3.5–5.1)
Sodium: 138 mEq/L (ref 135–145)

## 2013-02-22 LAB — MAGNESIUM: Magnesium: 2.2 mg/dL (ref 1.5–2.5)

## 2013-02-22 LAB — LIPID PANEL
HDL: 31 mg/dL — ABNORMAL LOW (ref >39)
LDL Cholesterol: 101 mg/dL — ABNORMAL HIGH (ref 0–99)

## 2013-02-22 MED ORDER — ONDANSETRON HCL 4 MG/2ML IJ SOLN
4.0000 mg | Freq: Four times a day (QID) | INTRAMUSCULAR | Status: DC | PRN
  Administered 2013-02-22 (×2): 4 mg via INTRAVENOUS
  Filled 2013-02-22 (×2): qty 2

## 2013-02-22 MED ORDER — METOPROLOL TARTRATE 1 MG/ML IV SOLN
5.0000 mg | Freq: Once | INTRAVENOUS | Status: AC
  Administered 2013-02-22: 5 mg via INTRAVENOUS
  Filled 2013-02-22: qty 5

## 2013-02-22 MED ORDER — ENOXAPARIN SODIUM 30 MG/0.3ML ~~LOC~~ SOLN
30.0000 mg | SUBCUTANEOUS | Status: DC
  Administered 2013-02-22 – 2013-02-26 (×5): 30 mg via SUBCUTANEOUS
  Filled 2013-02-22 (×5): qty 0.3

## 2013-02-22 MED ORDER — TECHNETIUM TO 99M ALBUMIN AGGREGATED: 3.0000 | Freq: Once | INTRAVENOUS | Status: AC | PRN

## 2013-02-22 MED ORDER — SODIUM CHLORIDE 0.9 % IV SOLN
250.0000 mL | INTRAVENOUS | Status: DC | PRN
  Administered 2013-02-24: 250 mL via INTRAVENOUS

## 2013-02-22 MED ORDER — FUROSEMIDE 40 MG PO TABS
40.0000 mg | ORAL_TABLET | Freq: Every day | ORAL | Status: DC
  Filled 2013-02-22 (×2): qty 1

## 2013-02-22 MED ORDER — POTASSIUM CHLORIDE CRYS ER 20 MEQ PO TBCR
40.0000 meq | EXTENDED_RELEASE_TABLET | ORAL | Status: AC
  Administered 2013-02-22: 40 meq via ORAL
  Filled 2013-02-22 (×2): qty 2

## 2013-02-22 MED ORDER — METOPROLOL TARTRATE 1 MG/ML IV SOLN
5.0000 mg | Freq: Three times a day (TID) | INTRAVENOUS | Status: DC
  Administered 2013-02-22 – 2013-02-25 (×9): 5 mg via INTRAVENOUS
  Filled 2013-02-22 (×13): qty 5

## 2013-02-22 NOTE — Progress Notes (Signed)
Stroke Team Progress Note  HISTORY Kevin Escobar is an 72 y.o. male with a past medical history significant for HTN, TIA, CKD, renal artery stenosis s/p stent placement, AAA repair in 1995, prostate and bladder cancer, admitted to [facility] with new onset right hemiparesis and 2 episodes of transient language impairment.  His wife tells me that the day before admission 02/19/2013 he was not walking good and fell because he was not able to use his right leg properly. Then he awoke yesterday 02/20/2013 and started having gradual progressing generalized weakness. At around 1:00 he had another episode of difficulty speaking and more localized weakness involving the right hemibody and he agreed to come to the ED on 02/20/2013.CT brain upon arrival to ED showed no acute intracranial abnormality. On aspirin 325 mg daily and simvastatin 40 mg daily. Neuro was consulted 02/21/2013. Patient was not a TPA candidate secondary to delay in arrival.  SUBJECTIVE His wife and male are at the bedside.  Overall they are concerned his condition is worsening.  OBJECTIVE Most recent Vital Signs: Filed Vitals:   02/21/13 2338 02/22/13 0617 02/22/13 0755 02/22/13 1030  BP: 147/99 188/106 186/109 187/100  Pulse: 128 120 118 94  Temp:  97.6 F (36.4 C)  97.8 F (36.6 C)  TempSrc:  Oral  Oral  Resp: 22   20  Height:      Weight:      SpO2: 99%   92%   CBG (last 3)   Recent Labs  02/21/13 2155 02/22/13 0711 02/22/13 1132  GLUCAP 164* 157* 124*    IV Fluid Intake:     MEDICATIONS  . cloNIDine  0.1 mg Oral Q1200  . clopidogrel  75 mg Oral Q breakfast  . furosemide  40 mg Oral Daily  . pneumococcal 23 valent vaccine  0.5 mL Intramuscular Tomorrow-1000  . potassium chloride  40 mEq Oral Q4H  . simvastatin  40 mg Oral q1800  . sodium chloride  3 mL Intravenous Q12H   PRN:  sodium chloride, acetaminophen, acetaminophen, hydrALAZINE, LORazepam, ondansetron, senna-docusate, sodium chloride, traZODone  Diet:  NPO   Activity:  OOB with assistance,  Up with assistance DVT Prophylaxis:  SCDs   CLINICALLY SIGNIFICANT STUDIES Basic Metabolic Panel:  Recent Labs Lab 02/21/13 0829 02/22/13 0540 02/22/13 0800  NA 139 138  --   K 3.3* 3.1*  --   CL 104 102  --   CO2 21 23  --   GLUCOSE 87 144*  --   BUN 38* 37*  --   CREATININE 2.88* 2.71*  --   CALCIUM 9.7 10.2  --   MG  --   --  2.2   Liver Function Tests:  Recent Labs Lab 02/20/13 1631  AST 10  ALT 10  ALKPHOS 62  BILITOT 0.2*  PROT 6.5  ALBUMIN 3.2*   CBC:  Recent Labs Lab 02/20/13 1631 02/20/13 1654  WBC 7.5  --   NEUTROABS 4.2  --   HGB 12.8* 12.9*  HCT 37.6* 38.0*  MCV 87.4  --   PLT 172  --    Coagulation:  Recent Labs Lab 02/20/13 1631  LABPROT 12.6  INR 0.96   Cardiac Enzymes: No results found for this basename: CKTOTAL, CKMB, CKMBINDEX, TROPONINI,  in the last 168 hours Urinalysis:  Recent Labs Lab 02/20/13 1804  COLORURINE YELLOW  LABSPEC 1.010  PHURINE 6.0  GLUCOSEU NEGATIVE  HGBUR NEGATIVE  BILIRUBINUR NEGATIVE  KETONESUR NEGATIVE  PROTEINUR 100*  UROBILINOGEN 0.2  NITRITE NEGATIVE  LEUKOCYTESUR NEGATIVE   Lipid Panel    Component Value Date/Time   CHOL 176 02/22/2013 0540   TRIG 218* 02/22/2013 0540   HDL 31* 02/22/2013 0540   CHOLHDL 5.7 02/22/2013 0540   VLDL 44* 02/22/2013 0540   LDLCALC 101* 02/22/2013 0540   HgbA1C  Lab Results  Component Value Date   HGBA1C 5.6 02/21/2013    Urine Drug Screen:     Component Value Date/Time   LABOPIA NONE DETECTED 02/20/2013 1803   COCAINSCRNUR NONE DETECTED 02/20/2013 1803   LABBENZ NONE DETECTED 02/20/2013 1803   AMPHETMU NONE DETECTED 02/20/2013 1803   THCU NONE DETECTED 02/20/2013 1803   LABBARB NONE DETECTED 02/20/2013 1803    Alcohol Level: No results found for this basename: ETH,  in the last 168 hours  CT of the brain   02/22/2013    1.  Stable age related cerebral atrophy, ventriculomegaly and periventricular white matter disease. 2.  Remote  lacunar type basal ganglia infarcts and remote left occipital cortical infarct. 3.  Acute left basal ganglia lacunar type infarct without complicating features. 4.  No acute hemispheric infarction or intracranial hemorrhage.   02/20/2013    No acute intracranial abnormalities.  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  Chronic microvascular ischemic change.     MRI of the brain 02/21/2013   1.  Acute lacunar infarct left posterior limb internal capsule.  No mass effect or hemorrhage. 2.  Underlying advanced chronic small and medium size vessel ischemia, with some progression since 2012.   MRA of the brain  02/21/2013  Stable and negative intracranial MRA.     2D Echocardiogram  EF 60-65% with no source of embolus.   Carotid Doppler  There is 0-39% ICA stenosis. Vertebral artery flow is antegrade.  CXR  02/21/2013   No active cardiopulmonary abnormalities.    EKG  sinus tachycardia.   Therapy Recommendations CIR  Physical Exam   Frail elderly male not in distress.Awake alert. Afebrile. Head is nontraumatic. Neck is supple without bruit. Hearing is normal. Cardiac exam no murmur or gallop. Lungs are clear to auscultation. Distal pulses are well felt. Neurological Exam ;  Awake alert oriented x 3 normal speech and language. Mild right lower face asymmetry. Tongue midline. No drift. Mild diminished fine finger movements on right. Orbits left  Over right upper extremity. RUE 4/5 and RLE 1/5 weakness Mild right grip weak..Decrease right hemibody sensation . Impaired right sided coordination. ASSESSMENT Mr. Kevin Escobar is a 72 y.o. male presenting with right hemiparesis and 2 episodes of transient language impairment . Imaging confirms a left PLIC infarct (this is a lacunar infarct). Infarct felt to be thrombotic secondary to small vessel disease.  On aspirin 325 mg orally every day prior to admission. Now on clopidogrel 75 mg orally every day for secondary stroke prevention. Patient with resultant right  hemiparesis and aphasia that has worsened during hospitalization. (The frequency of progression of neurological deficits after onset is most common in patients with lacunar infarcts (37%). The only treatment shown to be effective in patients with progressing lacunar infarcts is augmentation of cerebral blood flow, though this has not been studied in a controlled trial.)  Hypertension Hyperlipidemia, LDL 101, on zocor 10 PTA, now on zocor 40, goal LDL < 100 (< 70 for diabetics) Hx TIA PVD - s/p renal stent and AAA repair CKD  Hospital day # 2  TREATMENT/PLAN  Continue clopidogrel 75 mg orally every day for secondary stroke prevention.  Add lovenox for VTE prophylaxis  Agree with rehab consult  Will keep flat as much as possible today in order to possibly improve neuro status /halt neuro worsening.  ST to reassess swallow  NS @ 75h  Annie Main, MSN, RN, ANVP-BC, ANP-BC, [facility] Pager: [phone] 02/22/2013 12:04 PM  I have personally obtained a history, examined the patient, evaluated imaging results, and formulated the assessment and plan of care. I agree with the above. Delia Heady, MD

## 2013-02-22 NOTE — Progress Notes (Signed)
Agree with below cancel. Will f/u as patient is more medically stable to participate. Ivonne Andrew PT, DPT Pager: [phone]

## 2013-02-22 NOTE — Progress Notes (Signed)
UR COMPLETED  

## 2013-02-22 NOTE — Progress Notes (Signed)
Rehab Admissions Coordinator Note:  Patient was screened by Trish Mage for appropriateness for an Inpatient Acute Rehab Consult.  Noted PT recommending CIR.  At this time, we are recommending Inpatient Rehab consult.  We will also need an OT consult.  Trish Mage 02/22/2013, 8:29 AM  I can be reached at [phone].

## 2013-02-22 NOTE — Progress Notes (Signed)
SLP Cancellation Note  Patient Details Name: Kevin Escobar MRN: [id number] DOB: [birth year]   Cancelled treatment:       Reason Eval/Treat Not Completed: Medical issues which prohibited therapy  Note events over the weekend and pt now NPO.  Will return next date for follow up, if MD desire SLP to see pt before, please page.  Thanks.   Donavan Burnet, MS [facility] SLP [phone]    Chales Abrahams 02/22/2013, 9:45 AM

## 2013-02-22 NOTE — Progress Notes (Signed)
Lenny Pastel, NP paged at 2300 regarding pt's high manual BP 180/110, pulse tachy at 103. Pt tossing and turning in bed since 2200.  Hydralyzine 10mg  ordered IV. Hydralyzine given at 2313. Around 2330, pt started to become more restless and moan. Pt stated he was not in any pain or having trouble breathing, but respirations rate increased and pt stated that he "needed to sit up". 2L Raymond placed on pt, but pt kept removing stating "there is too much on me". Claiborne Billings paged again regarding pt's increase in restlessness. BP 147/99, pulse 128. Pt states that he "feels bad", but can't explain. Denies nausea, lightheadedness, pain, SOB. Claiborne Billings ordered Ativan 0.5mg  IV. Ativan given at 2350. Salvadore Oxford, RN 02/22/13 0050.

## 2013-02-22 NOTE — Progress Notes (Signed)
Late Entry: Around 0620, Lenny Pastel paged regarding pt's HR in 120-130's sustained and BP 188/106. Pt also vomited after drinking water and RN heard gurgles. During neuro check, pt unable to lift right leg off of bed and barely wiggles toes. Weakness has increased in right side and speech sounds more slurred. Metoprolol 5 mg IV ordered to be given for BP and pulse, Zofran 4 mg IV for be given if vomiting continues. Claiborne Billings instructed RN to page Neurologist on call regarding pt's increased weakness and slurred speech. Dr. Thad Ranger paged. Dr. Thad Ranger ordered STAT CT and for pt to be placed NPO until Dr. Leroy Kennedy could see him. RN accompanied pt to CT and administered Metoprolol after returning from CT. Night RN reported off to Day shift. Salvadore Oxford, RN [phone] 02/22/13

## 2013-02-22 NOTE — Progress Notes (Signed)
[facility] PROGRESS NOTE  Kevin Escobar ZOX:[id number] DOB: 1940/12/17 DOA: 02/20/2013 PCP: No primary provider on file.  Assessment/Plan: #1. Acute CVA Patient with worsening sxs of r facial weakness, dysarthria and worsening RLE weakness. Patient was on aspirin prior to admission. Head CT is negative. MRI c/w CVA. 2-D echo with EF 60-65% with NWMA and no source of emboli. FLP with LDL 101. Carotid Dopplers preliminary result with no significant ICA stenosis. Continue Plavix per neurology recommendations. PT/OT/ST. Risk factor modification. Neurology following and appreciate input and recommendations.  #2 hypertension Permissive hypertension. Norvasc on hold. Continue clonidine. IV Lopressor. Resume norvasc tomm if BP still elevated.  #3 chronic kidney disease Stable. Resume lasix.  #4 peripheral vascular disease status post renal stent and AAA repair Stable. Patient has been changed from aspirin to Plavix. Follow.  #5 depression Stable.  #6 hyperlipidemia Continue Zocor.  #7 Hypokalemia secondary to diureses. Replete.   #8 prophylaxis SCDs for DVT prophylaxis.  Code Status: Full Family Communication: Updated patient. No family at bedside. Disposition Plan: CIR vs SNF when medically stable.   Consultants:  Neurology: Dr Cyril Mourning 02/21/13  Procedures:  MRI pending  CT head 02/20/13  CXR 02/21/13  2-D echo 02/21/2013  Carotid Dopplers 02/21/2013  V/Q scan 02/22/13  Antibiotics:  None  HPI/Subjective: Patient with RLE weakness. Patient with dysarthria and R facial weakness.  Objective: Filed Vitals:   02/21/13 2338 02/22/13 0617 02/22/13 0755 02/22/13 1030  BP: 147/99 188/106 186/109 187/100  Pulse: 128 120 118 94  Temp:  97.6 F (36.4 C)  97.8 F (36.6 C)  TempSrc:  Oral  Oral  Resp: 22   20  Height:      Weight:      SpO2: 99%   92%    Intake/Output Summary (Last 24 hours) at 02/22/13 1637 Last data filed at 02/22/13 0220  Gross per 24  hour  Intake      0 ml  Output    425 ml  Net   -425 ml   Filed Weights   02/20/13 1604 02/20/13 2054  Weight: 68.04 kg (150 lb) 67.7 kg (149 lb 4 oz)    Exam:   General:  nad  Cardiovascular: rrr  Respiratory: Left basilar crackles. No wheezing  Abdomen: Soft/NT/ND/+BS  Neuro: RLE weakness. RUE weakness. Dysarthria, R facial weakness.  Data Reviewed: Basic Metabolic Panel:  Recent Labs Lab 02/20/13 1631 02/20/13 1654 02/21/13 0829 02/22/13 0540 02/22/13 0800  NA 137 142 139 138  --   K 3.2* 3.2* 3.3* 3.1*  --   CL 102 107 104 102  --   CO2 21  --  21 23  --   GLUCOSE 122* 118* 87 144*  --   BUN 40* 39* 38* 37*  --   CREATININE 2.96* 2.70* 2.88* 2.71*  --   CALCIUM 9.5  --  9.7 10.2  --   MG  --   --   --   --  2.2   Liver Function Tests:  Recent Labs Lab 02/20/13 1631  AST 10  ALT 10  ALKPHOS 62  BILITOT 0.2*  PROT 6.5  ALBUMIN 3.2*   No results found for this basename: LIPASE, AMYLASE,  in the last 168 hours No results found for this basename: AMMONIA,  in the last 168 hours CBC:  Recent Labs Lab 02/20/13 1631 02/20/13 1654  WBC 7.5  --   NEUTROABS 4.2  --   HGB 12.8* 12.9*  HCT 37.6* 38.0*  MCV 87.4  --   PLT 172  --    Cardiac Enzymes: No results found for this basename: CKTOTAL, CKMB, CKMBINDEX, TROPONINI,  in the last 168 hours BNP (last 3 results) No results found for this basename: PROBNP,  in the last 8760 hours CBG:  Recent Labs Lab 02/21/13 0641 02/21/13 1126 02/21/13 2155 02/22/13 0711 02/22/13 1132  GLUCAP 83 84 164* 157* 124*    No results found for this or any previous visit (from the past 240 hour(s)).   Studies: Ct Head Wo Contrast  02/22/2013   *RADIOLOGY REPORT*  Clinical Data: Worsening right-sided weakness.  CT HEAD WITHOUT CONTRAST  Technique:  Contiguous axial images were obtained from the base of the skull through the vertex without contrast.  Comparison: CT scan 02/20/2013 and MRI 02/21/2013.  Findings:  Stable age related cerebral atrophy, ventriculomegaly and periventricular white matter disease.  There are remote lacunar type infarcts and a remote left occipital cortical infarct.  The left basal ganglia lacunar type infarct is again demonstrated.  No complicating features such as hemorrhage.  No findings for acute hemispheric infarction and no mass lesions.  The brainstem and cerebellum are stable.  Stable patchy sinus disease.  IMPRESSION:  1.  Stable age related cerebral atrophy, ventriculomegaly and periventricular white matter disease. 2.  Remote lacunar type basal ganglia infarcts and remote left occipital cortical infarct. 3.  Acute left basal ganglia lacunar type infarct without complicating features. 4.  No acute hemispheric infarction or intracranial hemorrhage.   Original Report Authenticated By: Rudie Meyer, M.D.   Ct Head Wo Contrast  02/20/2013   *RADIOLOGY REPORT*  Clinical Data: Right-sided weakness  CT HEAD WITHOUT CONTRAST  Technique:  Contiguous axial images were obtained from the base of the skull through the vertex without contrast.  Comparison: Brain MRI, 06/05/2011  Findings: The ventricles are normal in configuration.  There is ventricular enlargement, greater than sulcal enlargement, consistent with a predominance of central volume loss.  There is no hydrocephalus.  There are no parenchymal masses or mass effect.  There is an old left anterior basal ganglia lacunar infarct and small old deep white matter lacunar infarcts stable from the prior brain MRI. There is a small left parietal lobe infarct. Patchy white matter hypoattenuation noted bilaterally is consistent with moderate chronic microvascular ischemic change.  There is no evidence of a recent infarct.  There are no extra-axial masses or abnormal fluid collections.  There is no intracranial hemorrhage.  The visualized sinuses and mastoid air cells are clear.  IMPRESSION: No acute intracranial abnormalities.  Atrophy and multiple  old infarcts.  Chronic microvascular ischemic change.   Original Report Authenticated By: Amie Portland, M.D.   Mr [facility] Wo Contrast  02/21/2013   *RADIOLOGY REPORT*  Clinical Data:  72 year old male with right side weakness, altered speech.  Comparison: Head CT without contrast 02/20/2013.  Brain MRI and MRA 06/05/2011.  MRI HEAD WITHOUT CONTRAST  Technique: Multiplanar, multiecho pulse sequences of the brain and surrounding structures were obtained according to standard protocol without intravenous contrast.  Findings: Study is degraded by motion artifact despite repeated imaging attempts.  Cerebral volume has not significantly changed.  10 mm focus of restricted diffusion in the left internal capsule posterior limb (series 3 image 13).  Mild associated T2 and FLAIR hyperintensity.  No mass effect or associated hemorrhage.  No other diffusion restriction. Major intracranial vascular flow voids are stable.  Chronic lacunar infarcts in both medial thalami are new since 2012.  Otherwise stable moderately advanced chronic small vessel ischemia in and around the deep gray matter nuclei.  Small chronic left occipital lobe infarct is stable.  Multiple small chronic lacunar infarcts in both cerebellar hemispheres, better demonstrated on the previous study due to motion today.  No midline shift, ventriculomegaly, mass effect, evidence of mass lesion, extra-axial collection or acute intracranial hemorrhage. Cervicomedullary junction and pituitary are within normal limits. C3-C4 disc degeneration. Visualized bone marrow signal is within normal limits.  Interval postoperative changes to the right globe.  Otherwise stable orbit soft tissues.  Interval decreased paranasal sinus mucosal thickening.  Mastoids are clear.  IMPRESSION: 1.  Acute lacunar infarct left posterior limb internal capsule.  No mass effect or hemorrhage. 2.  Underlying advanced chronic small and medium size vessel ischemia, with some progression since  2012. 3.  MRA findings are below.  MRA HEAD WITHOUT CONTRAST  Technique: Angiographic images of the Circle of Willis were obtained using MRA technique without  intravenous contrast.  Findings: Stable antegrade flow in the posterior circulation with dominant distal right vertebral artery, and the left vertebral functionally terminating PICA.  Normal right PICA.  Stable basilar artery without stenosis.  A somewhat unusual dual PCA supply from both the posterior communicating arteries and P1 segments (inferior and superior PCA divisions supplied separately) is re-identified.  Bilateral PCA branches are stable and within normal limits. No proximal left PCA irregularity to correspond to the acute MRI findings.  Stable antegrade flow in both ICA siphon.  Ophthalmic and posterior communicating artery origins are within normal limits.  Carotid termini are stable.  MCA origins are stable.  The left ACA A1 segment is dominant.  Small left lenticulostriate artery infundibulum arises from the A1 segment and is more apparent on today's MIP images, but stable.  Bilateral MCA branches are stable and within normal limits. Anterior communicating artery and bilateral ACA branches are stable and within normal limits.  IMPRESSION: Stable and negative intracranial MRA.   Original Report Authenticated By: Erskine Speed, M.D.   Mr Brain Wo Contrast  02/21/2013   *RADIOLOGY REPORT*  Clinical Data:  72 year old male with right side weakness, altered speech.  Comparison: Head CT without contrast 02/20/2013.  Brain MRI and MRA 06/05/2011.  MRI HEAD WITHOUT CONTRAST  Technique: Multiplanar, multiecho pulse sequences of the brain and surrounding structures were obtained according to standard protocol without intravenous contrast.  Findings: Study is degraded by motion artifact despite repeated imaging attempts.  Cerebral volume has not significantly changed.  10 mm focus of restricted diffusion in the left internal capsule posterior limb  (series 3 image 13).  Mild associated T2 and FLAIR hyperintensity.  No mass effect or associated hemorrhage.  No other diffusion restriction. Major intracranial vascular flow voids are stable.  Chronic lacunar infarcts in both medial thalami are new since 2012. Otherwise stable moderately advanced chronic small vessel ischemia in and around the deep gray matter nuclei.  Small chronic left occipital lobe infarct is stable.  Multiple small chronic lacunar infarcts in both cerebellar hemispheres, better demonstrated on the previous study due to motion today.  No midline shift, ventriculomegaly, mass effect, evidence of mass lesion, extra-axial collection or acute intracranial hemorrhage. Cervicomedullary junction and pituitary are within normal limits. C3-C4 disc degeneration. Visualized bone marrow signal is within normal limits.  Interval postoperative changes to the right globe.  Otherwise stable orbit soft tissues.  Interval decreased paranasal sinus mucosal thickening.  Mastoids are clear.  IMPRESSION: 1.  Acute lacunar infarct left  posterior limb internal capsule.  No mass effect or hemorrhage. 2.  Underlying advanced chronic small and medium size vessel ischemia, with some progression since 2012. 3.  MRA findings are below.  MRA HEAD WITHOUT CONTRAST  Technique: Angiographic images of the Circle of Willis were obtained using MRA technique without  intravenous contrast.  Findings: Stable antegrade flow in the posterior circulation with dominant distal right vertebral artery, and the left vertebral functionally terminating PICA.  Normal right PICA.  Stable basilar artery without stenosis.  A somewhat unusual dual PCA supply from both the posterior communicating arteries and P1 segments (inferior and superior PCA divisions supplied separately) is re-identified.  Bilateral PCA branches are stable and within normal limits. No proximal left PCA irregularity to correspond to the acute MRI findings.  Stable antegrade  flow in both ICA siphon.  Ophthalmic and posterior communicating artery origins are within normal limits.  Carotid termini are stable.  MCA origins are stable.  The left ACA A1 segment is dominant.  Small left lenticulostriate artery infundibulum arises from the A1 segment and is more apparent on today's MIP images, but stable.  Bilateral MCA branches are stable and within normal limits. Anterior communicating artery and bilateral ACA branches are stable and within normal limits.  IMPRESSION: Stable and negative intracranial MRA.   Original Report Authenticated By: Erskine Speed, M.D.   Nm Pulmonary Perfusion  02/22/2013   *RADIOLOGY REPORT*  Clinical Data:  NUCLEAR MEDICINE - PERFUSION LUNG SCAN  Technique:   Perfusion images were obtained in multiple projections after intravenous injection of Tc-61m MAA.  Ventilation scans were not performed.  Radiopharmaceuticals:  3.0 mCi Tc-38m MAA.  Comparison:  Chest x-ray 02/21/2013  Findings: The perfusion lung scan demonstrates a few patchy perfusion defects.  No segmental or subsegmental defects are suggested to suggest pulmonary emboli.  IMPRESSION: Low probability perfusion lung scan for pulmonary embolism.   Original Report Authenticated By: Rudie Meyer, M.D.   Dg Chest Port 1 View  02/21/2013   *RADIOLOGY REPORT*  Clinical Data: Short of breath  PORTABLE CHEST - 1 VIEW  Comparison: None  Findings: The heart size and mediastinal contours are within normal limits.  Both lungs are clear.  The visualized skeletal structures are unremarkable.  IMPRESSION: No active cardiopulmonary abnormalities.   Original Report Authenticated By: Signa Kell, M.D.    Scheduled Meds: . cloNIDine  0.1 mg Oral Q1200  . clopidogrel  75 mg Oral Q breakfast  . enoxaparin (LOVENOX) injection  30 mg Subcutaneous Q24H  . furosemide  40 mg Oral Daily  . metoprolol  5 mg Intravenous Q8H  . pneumococcal 23 valent vaccine  0.5 mL Intramuscular Tomorrow-1000  . potassium chloride  40  mEq Oral Q4H  . simvastatin  40 mg Oral q1800  . sodium chloride  3 mL Intravenous Q12H   Continuous Infusions:   Principal Problem:   CVA (cerebral vascular accident) Active Problems:   Chronic kidney disease   Hypertension   Peripheral vascular disease   Depression   PVD (peripheral vascular disease)    Time spent: > 35 mins    [facility]  [facility] Pager [phone]. If 7PM-7AM, please contact night-coverage at [email or web address], password [facility] 02/22/2013, 4:37 PM  LOS: 2 days

## 2013-02-22 NOTE — Progress Notes (Signed)
PT/OT Cancellation Note  Patient Details Name: TRUE GARCIAMARTINEZ MRN: [id number] DOB: [birth year]   Cancelled Treatment:    Reason Eval/Treat Not Completed: Medical issues which prohibited therapy - elevated BP, and pt with n/v.   Will reattempt as medically appropriate.  Jeani Hawking M [phone] 02/22/2013, 10:36 AM

## 2013-02-22 NOTE — Consult Note (Signed)
Physical Medicine and Rehabilitation Consult  Reason for Consult: Right sided weakness, speech difficulties.  Referring Physician: Dr.    Sula Rumple: Kevin Escobar is a 72 y.o. male with a past medical history significant for HTN, TIA, CKD, renal artery stenosis s/p stent placement, AAA repair in 1995, prostate and bladder cancer; admitted on 02/21/13 with progressive difficulty walking due to right sided weakness and fall as well  and 2 episodes of transient language impairment for >24 hours PTA. MRI/MRA brain with acute left posterior limb infarct and no IC stenosis. 2D echo with EF 60-65% and  No wall abnormality. Carotid dopplers with mild soft plaque L-ICA origin. Patient with neurological worsening on 07/21 with vomiting/?aspiration event.     ROS: Unattainable due to mental status. See nurses report  Past Medical History  Diagnosis Date  . Cancer of bladder 2010  . Cancer of prostate 1994  . Depression   . Chronic kidney disease   . Hypertension   . TIA (transient ischemic attack) 2008  . Peripheral vascular disease   . PVD (peripheral vascular disease) 02/21/2013   Past Surgical History  Procedure Laterality Date  . Abdominal aortic aneurysm repair  1995  . Prostate surgery  1994  . Colon surgery  2010   History reviewed. No pertinent family history. Social History:  reports that he has been smoking.  He has never used smokeless tobacco. He reports that he does not drink alcohol or use illicit drugs. Allergies:  Allergies  Allergen Reactions  . Other Rash    "Cheerios cause me to break out--bumps all over my chest"  . Plaquenil (Hydroxychloroquine Sulfate) Rash    Severe rash   Medications Prior to Admission  Medication Sig Dispense Refill  . acetaminophen (TYLENOL) 500 MG tablet Take 1,500 mg by mouth daily. pain      . amLODipine (NORVASC) 10 MG tablet Take 10 mg by mouth See admin instructions. Takes 1 tablet every day at noon and a 2nd tablet 2-3 times week (random  days) in the evening      . cloNIDine (CATAPRES) 0.1 MG tablet Take 0.1 mg by mouth See admin instructions. Takes 1 tablet every day at noon and 2-3 times week (random days) takes a 2nd tablet in the evening      . diphenhydrAMINE (BENADRYL) 25 mg capsule Take 25-50 mg by mouth daily as needed for itching.      . furosemide (LASIX) 40 MG tablet Take 80 mg by mouth See admin instructions. Takes 2 tablets (80 mg) daily at noon and twice a week (random days) takes an extra tablet at 6pm      . metoprolol (LOPRESSOR) 100 MG tablet Take 25 mg by mouth daily with lunch.      . simvastatin (ZOCOR) 10 MG tablet Take 10 mg by mouth daily with lunch.       . traZODone (DESYREL) 50 MG tablet Take 50-100 mg by mouth at bedtime as needed for sleep.        Home: Home Living Family/patient expects to be discharged to:: Inpatient rehab Living Arrangements: Spouse/significant other Available Help at Discharge: Family;Available 24 hours/day Type of Home: House Home Access: Stairs to enter [employer] of Steps: 4 Entrance Stairs-Rails: Right Home Layout: One level Home Equipment: Cane - quad;Cane - single point  Functional History:   Functional Status:  Mobility: Bed Mobility Bed Mobility: Rolling Right;Right Sidelying to Sit;Sitting - Scoot to Edge of Bed;Sit to Supine Rolling Right: 4: Min assist;With rail  Right Sidelying to Sit: 3: Mod assist;HOB elevated;With rails Sitting - Scoot to Edge of Bed: 3: Mod assist (Max assist to scoot laterally up in bed) Sit to Supine: 3: Mod assist;HOB flat Transfers Transfers: Not assessed      ADL:    Cognition: Cognition Overall Cognitive Status: Within Functional Limits for tasks assessed Orientation Level: Oriented X4 Cognition Arousal/Alertness: Lethargic Behavior During Therapy: WFL for tasks assessed/performed Overall Cognitive Status: Within Functional Limits for tasks assessed  Blood pressure 187/100, pulse 94, temperature 97.8 F  (36.6 C), temperature source Oral, resp. rate 20, height 5\' 11"  (1.803 m), weight 67.7 kg (149 lb 4 oz), SpO2 92.00%.   General: Alert, NAD HEENT: Head is normocephalic, atraumatic, PERRLA, EOMI, sclera anicteric, oral mucosa pink and moist, dentition intact, ext ear canals clear,  Neck: Supple without JVD or lymphadenopathy Heart: reg. No murmurs rubs or gallops Chest: CTA bilaterally without wheezes, rales, or rhonchi; no distress,   Abdomen: Soft, non-tender, non-distended, bowel sounds positive. Extremities: No clubbing, cyanosis, or edema Pulses are 2+, Skin: Clean and intact without signs of breakdown Neuro: speech dysarthric, right central 7 and tongue deviation. Sensation grossly intact. Intact basic insight and awareness. RUE is 1+ to 2/5. RLE is similar at 1-2/5 but inconsistent. Senses pain in arm and leg. dtr's 1+ Musculoskeletal: Full ROM, No pain with AROM or PROM in the neck, trunk, or extremities. Posture appropriate Psych: Pt's affect is appropriate. Pt is cooperative    Results for orders placed during the hospital encounter of 02/20/13 (from the past 24 hour(s))  GLUCOSE, CAPILLARY     Status: Abnormal   Collection Time    02/21/13  9:55 PM      Result Value Range   Glucose-Capillary 164 (*) 70 - 99 mg/dL   Comment 1 Documented in Chart     Comment 2 Notify RN    BASIC METABOLIC PANEL     Status: Abnormal   Collection Time    02/22/13  5:40 AM      Result Value Range   Sodium 138  135 - 145 mEq/L   Potassium 3.1 (*) 3.5 - 5.1 mEq/L   Chloride 102  96 - 112 mEq/L   CO2 23  19 - 32 mEq/L   Glucose, Bld 144 (*) 70 - 99 mg/dL   BUN 37 (*) 6 - 23 mg/dL   Creatinine, Ser 1.61 (*) 0.50 - 1.35 mg/dL   Calcium 09.6  8.4 - 04.5 mg/dL   GFR calc non Af Amer 22 (*) >90 mL/min   GFR calc Af Amer 25 (*) >90 mL/min  LIPID PANEL     Status: Abnormal   Collection Time    02/22/13  5:40 AM      Result Value Range   Cholesterol 176  0 - 200 mg/dL   Triglycerides 409 (*)  <150 mg/dL   HDL 31 (*) >81 mg/dL   Total CHOL/HDL Ratio 5.7     VLDL 44 (*) 0 - 40 mg/dL   LDL Cholesterol 191 (*) 0 - 99 mg/dL  GLUCOSE, CAPILLARY     Status: Abnormal   Collection Time    02/22/13  7:11 AM      Result Value Range   Glucose-Capillary 157 (*) 70 - 99 mg/dL   Comment 1 Documented in Chart     Comment 2 Notify RN    MAGNESIUM     Status: None   Collection Time    02/22/13  8:00 AM  Result Value Range   Magnesium 2.2  1.5 - 2.5 mg/dL  GLUCOSE, CAPILLARY     Status: Abnormal   Collection Time    02/22/13 11:32 AM      Result Value Range   Glucose-Capillary 124 (*) 70 - 99 mg/dL   Comment 1 Documented in Chart     Comment 2 Notify RN     Ct Head Wo Contrast  02/22/2013   *RADIOLOGY REPORT*  Clinical Data: Worsening right-sided weakness.  CT HEAD WITHOUT CONTRAST  Technique:  Contiguous axial images were obtained from the base of the skull through the vertex without contrast.  Comparison: CT scan 02/20/2013 and MRI 02/21/2013.  Findings: Stable age related cerebral atrophy, ventriculomegaly and periventricular white matter disease.  There are remote lacunar type infarcts and a remote left occipital cortical infarct.  The left basal ganglia lacunar type infarct is again demonstrated.  No complicating features such as hemorrhage.  No findings for acute hemispheric infarction and no mass lesions.  The brainstem and cerebellum are stable.  Stable patchy sinus disease.  IMPRESSION:  1.  Stable age related cerebral atrophy, ventriculomegaly and periventricular white matter disease. 2.  Remote lacunar type basal ganglia infarcts and remote left occipital cortical infarct. 3.  Acute left basal ganglia lacunar type infarct without complicating features. 4.  No acute hemispheric infarction or intracranial hemorrhage.   Original Report Authenticated By: Rudie Meyer, M.D.   Ct Head Wo Contrast  02/20/2013   *RADIOLOGY REPORT*  Clinical Data: Right-sided weakness  CT HEAD WITHOUT  CONTRAST  Technique:  Contiguous axial images were obtained from the base of the skull through the vertex without contrast.  Comparison: Brain MRI, 06/05/2011  Findings: The ventricles are normal in configuration.  There is ventricular enlargement, greater than sulcal enlargement, consistent with a predominance of central volume loss.  There is no hydrocephalus.  There are no parenchymal masses or mass effect.  There is an old left anterior basal ganglia lacunar infarct and small old deep white matter lacunar infarcts stable from the prior brain MRI. There is a small left parietal lobe infarct. Patchy white matter hypoattenuation noted bilaterally is consistent with moderate chronic microvascular ischemic change.  There is no evidence of a recent infarct.  There are no extra-axial masses or abnormal fluid collections.  There is no intracranial hemorrhage.  The visualized sinuses and mastoid air cells are clear.  IMPRESSION: No acute intracranial abnormalities.  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  Chronic microvascular ischemic change.   Original Report Authenticated By: Amie Portland, M.D.   Mr [facility] Wo Contrast  02/21/2013   *RADIOLOGY REPORT*  Clinical Data:  72 year old male with right side weakness, altered speech.  Comparison: Head CT without contrast 02/20/2013.  Brain MRI and MRA 06/05/2011.  MRI HEAD WITHOUT CONTRAST  Technique: Multiplanar, multiecho pulse sequences of the brain and surrounding structures were obtained according to standard protocol without intravenous contrast.  Findings: Study is degraded by motion artifact despite repeated imaging attempts.  Cerebral volume has not significantly changed.  10 mm focus of restricted diffusion in the left internal capsule posterior limb (series 3 image 13).  Mild associated T2 and FLAIR hyperintensity.  No mass effect or associated hemorrhage.  No other diffusion restriction. Major intracranial vascular flow voids are stable.  Chronic lacunar infarcts  in both medial thalami are new since 2012. Otherwise stable moderately advanced chronic small vessel ischemia in and around the deep gray matter nuclei.  Small chronic left occipital lobe infarct  is stable.  Multiple small chronic lacunar infarcts in both cerebellar hemispheres, better demonstrated on the previous study due to motion today.  No midline shift, ventriculomegaly, mass effect, evidence of mass lesion, extra-axial collection or acute intracranial hemorrhage. Cervicomedullary junction and pituitary are within normal limits. C3-C4 disc degeneration. Visualized bone marrow signal is within normal limits.  Interval postoperative changes to the right globe.  Otherwise stable orbit soft tissues.  Interval decreased paranasal sinus mucosal thickening.  Mastoids are clear.  IMPRESSION: 1.  Acute lacunar infarct left posterior limb internal capsule.  No mass effect or hemorrhage. 2.  Underlying advanced chronic small and medium size vessel ischemia, with some progression since 2012. 3.  MRA findings are below.  MRA HEAD WITHOUT CONTRAST  Technique: Angiographic images of the Circle of Willis were obtained using MRA technique without  intravenous contrast.  Findings: Stable antegrade flow in the posterior circulation with dominant distal right vertebral artery, and the left vertebral functionally terminating PICA.  Normal right PICA.  Stable basilar artery without stenosis.  A somewhat unusual dual PCA supply from both the posterior communicating arteries and P1 segments (inferior and superior PCA divisions supplied separately) is re-identified.  Bilateral PCA branches are stable and within normal limits. No proximal left PCA irregularity to correspond to the acute MRI findings.  Stable antegrade flow in both ICA siphon.  Ophthalmic and posterior communicating artery origins are within normal limits.  Carotid termini are stable.  MCA origins are stable.  The left ACA A1 segment is dominant.  Small left  lenticulostriate artery infundibulum arises from the A1 segment and is more apparent on today's MIP images, but stable.  Bilateral MCA branches are stable and within normal limits. Anterior communicating artery and bilateral ACA branches are stable and within normal limits.  IMPRESSION: Stable and negative intracranial MRA.   Original Report Authenticated By: Erskine Speed, M.D.   Mr Brain Wo Contrast  02/21/2013   *RADIOLOGY REPORT*  Clinical Data:  72 year old male with right side weakness, altered speech.  Comparison: Head CT without contrast 02/20/2013.  Brain MRI and MRA 06/05/2011.  MRI HEAD WITHOUT CONTRAST  Technique: Multiplanar, multiecho pulse sequences of the brain and surrounding structures were obtained according to standard protocol without intravenous contrast.  Findings: Study is degraded by motion artifact despite repeated imaging attempts.  Cerebral volume has not significantly changed.  10 mm focus of restricted diffusion in the left internal capsule posterior limb (series 3 image 13).  Mild associated T2 and FLAIR hyperintensity.  No mass effect or associated hemorrhage.  No other diffusion restriction. Major intracranial vascular flow voids are stable.  Chronic lacunar infarcts in both medial thalami are new since 2012. Otherwise stable moderately advanced chronic small vessel ischemia in and around the deep gray matter nuclei.  Small chronic left occipital lobe infarct is stable.  Multiple small chronic lacunar infarcts in both cerebellar hemispheres, better demonstrated on the previous study due to motion today.  No midline shift, ventriculomegaly, mass effect, evidence of mass lesion, extra-axial collection or acute intracranial hemorrhage. Cervicomedullary junction and pituitary are within normal limits. C3-C4 disc degeneration. Visualized bone marrow signal is within normal limits.  Interval postoperative changes to the right globe.  Otherwise stable orbit soft tissues.  Interval  decreased paranasal sinus mucosal thickening.  Mastoids are clear.  IMPRESSION: 1.  Acute lacunar infarct left posterior limb internal capsule.  No mass effect or hemorrhage. 2.  Underlying advanced chronic small and medium size vessel ischemia, with some  progression since 2012. 3.  MRA findings are below.  MRA HEAD WITHOUT CONTRAST  Technique: Angiographic images of the Circle of Willis were obtained using MRA technique without  intravenous contrast.  Findings: Stable antegrade flow in the posterior circulation with dominant distal right vertebral artery, and the left vertebral functionally terminating PICA.  Normal right PICA.  Stable basilar artery without stenosis.  A somewhat unusual dual PCA supply from both the posterior communicating arteries and P1 segments (inferior and superior PCA divisions supplied separately) is re-identified.  Bilateral PCA branches are stable and within normal limits. No proximal left PCA irregularity to correspond to the acute MRI findings.  Stable antegrade flow in both ICA siphon.  Ophthalmic and posterior communicating artery origins are within normal limits.  Carotid termini are stable.  MCA origins are stable.  The left ACA A1 segment is dominant.  Small left lenticulostriate artery infundibulum arises from the A1 segment and is more apparent on today's MIP images, but stable.  Bilateral MCA branches are stable and within normal limits. Anterior communicating artery and bilateral ACA branches are stable and within normal limits.  IMPRESSION: Stable and negative intracranial MRA.   Original Report Authenticated By: Erskine Speed, M.D.   Dg Chest Port 1 View  02/21/2013   *RADIOLOGY REPORT*  Clinical Data: Short of breath  PORTABLE CHEST - 1 VIEW  Comparison: None  Findings: The heart size and mediastinal contours are within normal limits.  Both lungs are clear.  The visualized skeletal structures are unremarkable.  IMPRESSION: No active cardiopulmonary abnormalities.   Original  Report Authenticated By: Signa Kell, M.D.    Assessment/Plan: Diagnosis: Right PLIC infarct 1. Does the need for close, 24 hr/day medical supervision in concert with the patient's rehab needs make it unreasonable for this patient to be served in a less intensive setting? Yes 2. Co-Morbidities requiring supervision/potential complications: depression, htn 3. Due to bladder management, bowel management, safety, skin/wound care, disease management, medication administration, pain management and patient education, does the patient require 24 hr/day rehab nursing? Yes 4. Does the patient require coordinated care of a physician, rehab nurse, PT (1-2 hrs/day, 5 days/week), OT (1-2 hrs/day, 5 days/week) and SLP (1-2 hrs/day, 5 days/week) to address physical and functional deficits in the context of the above medical diagnosis(es)? Yes Addressing deficits in the following areas: balance, endurance, locomotion, strength, transferring, bowel/bladder control, bathing, dressing, feeding, grooming, toileting, cognition, speech, swallowing and psychosocial support 5. Can the patient actively participate in an intensive therapy program of at least 3 hrs of therapy per day at least 5 days per week? Yes 6. The potential for patient to make measurable gains while on inpatient rehab is excellent 7. Anticipated functional outcomes upon discharge from inpatient rehab are supervision to minimal assist with PT, supervision to minimal assit with OT, supervision to mod I with SLP. 8. Estimated rehab length of stay to reach the above functional goals is: 3 weeks 9. Does the patient have adequate social supports to accommodate these discharge functional goals? Yes and Potentially 10. Anticipated D/C setting: Home 11. Anticipated post D/C treatments: HH therapy 12. Overall Rehab/Functional Prognosis: excellent  RECOMMENDATIONS: This patient's condition is appropriate for continued rehabilitative care in the following  setting: CIR Patient has agreed to participate in recommended program. Yes Note that insurance prior authorization may be required for reimbursement for recommended care.  Comment: Rehab RN to follow up.   Ranelle Oyster, MD, [place] [facility]     02/22/2013

## 2013-02-22 NOTE — Evaluation (Addendum)
Clinical/Bedside Swallow Evaluation Patient Details  Name: Kevin Escobar MRN: [id number] Date of Birth: 1941-06-22  Today's Date: 02/22/2013 Time: 4132-4401 SLP Time Calculation (min): 43 min  Past Medical History:  Past Medical History  Diagnosis Date  . Cancer of bladder 2010  . Cancer of prostate 1994  . Depression   . Chronic kidney disease   . Hypertension   . TIA (transient ischemic attack) 2008  . Peripheral vascular disease   . PVD (peripheral vascular disease) 02/21/2013   Past Surgical History:  Past Surgical History  Procedure Laterality Date  . Abdominal aortic aneurysm repair  1995  . Prostate surgery  1994  . Colon surgery  2010   HPI:  Kevin Escobar is an 72 y.o. male with a past medical history significant for HTN, TIA, CKD, renal artery stenosis s/p stent placement, AAA repair in 1995, prostate and bladder cancer, admitted to [facility] with new onset right hemiparesis and 2 episodes of transient language impairment..  Pt  was seen by Speech pathologist on Sunday with recommendations for dys2/thin, overtnight he had episode of vomiting and "gurgling" after intake of water, also with increased weakness, facial droop on right and slurred speech.  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  Repeat swallow evaluation ordered.    Assessment / Plan / Recommendation Clinical Impression  Moderate oroal dysphagia continues with anterior spillage on right of liquids, slow inefficient mastication of solids noted.  Pt was nodding off with food retained in oral cavity- lethargy is large factor.  Suspect pharyngeal delay in swallow response.    Initial swallows x2  of thin resulted in pt coughing and expectorating viscous yellow and green colored secretions.  No further overt coughing episodes noted but recommend strict aspiration precautions.    Rec restart diet of puree/thin with strict precautions.    SLP to proceed with mbs next am given pt admission to occasional  sensation of food lodging in pharynx during intake prior to admission that is likely exacerbated with current medical issue.   Pt and wife agreeable to plan and agree to strict precautions.      Aspiration Risk  Moderate    Diet Recommendation Dysphagia 1 (Puree);Thin liquid (extra gravy and sauces)   Liquid Administration via: Cup;Straw Medication Administration: Whole meds with puree Supervision: Full supervision/cueing for compensatory strategies;Trained caregiver to feed patient;Patient able to self feed Compensations: Slow rate;Small sips/bites;Check for pocketing;Clear throat intermittently;Hard cough after swallow (rest breaks as indicated) Postural Changes and/or Swallow Maneuvers: Seated upright 90 degrees;Upright 30-60 min after meal    Other  Recommendations Recommended Consults: MBS (next date in am ) Oral Care Recommendations: Oral care BID Other Recommendations: Clarify dietary restrictions   Follow Up Recommendations       Frequency and Duration min 2x/week  2 weeks   Pertinent Vitals/Pain Afebrile, decreased CXR negative    SLP Swallow Goals Patient will utilize recommended strategies during swallow to increase swallowing safety with: Minimal assistance Swallow Study Goal #2 - Progress: Progressing toward goal   Swallow Study Prior Functional Status   see clinical impressions    General Date of Onset: 02/20/13 HPI: Kevin Escobar is an 72 y.o. male with a past medical history significant for HTN, TIA, CKD, renal artery stenosis s/p stent placement, AAA repair in 1995, prostate and bladder cancer, admitted to [facility] with new onset right hemiparesis and 2 episodes of transient language impairment..  Pt  was seen by Speech pathologist on Sunday with recommendations for  dys2/thin, overtnight he had episode of vomiting and "gurgling" after intake of water, also with increased weakness, facial droop on right and slurred speech.  MRI completed that showed left basal ganglia  cva.  Repeat swallow evaluation ordered.  Type of Study: Bedside swallow evaluation Diet Prior to this Study: NPO Temperature Spikes Noted: No History of Recent Intubation: No Behavior/Cognition: Alert;Cooperative;Distractible;Pleasant mood;Requires cueing Oral Cavity - Dentition: Adequate natural dentition Self-Feeding Abilities: Needs assist Patient Positioning: Upright in bed Baseline Vocal Quality: Clear Volitional Cough: Congested;Wet (pt has a "smoker's cough" per spouse) Volitional Swallow: Able to elicit    Oral/Motor/Sensory Function Overall Oral Motor/Sensory Function: Impaired Labial ROM: Reduced right Labial Symmetry: Within Functional Limits Labial Strength: Reduced Labial Sensation: Reduced Lingual ROM: Within Functional Limits Lingual Symmetry: Within Functional Limits Lingual Strength: Within Functional Limits Lingual Sensation: Within Functional Limits Facial ROM: Reduced right Facial Symmetry: Right droop Facial Strength: Reduced Facial Sensation: Reduced Velum: Within Functional Limits Mandible: Within Functional Limits   Ice Chips Ice chips: Not tested   Thin Liquid Thin Liquid: Impaired Presentation: Cup;Straw Oral Phase Impairments: Impaired anterior to posterior transit;Reduced lingual movement/coordination Oral Phase Functional Implications: Right anterior spillage;Prolonged oral transit Pharyngeal  Phase Impairments: Suspected delayed Swallow;Wet Vocal Quality;Multiple swallows Other Comments: wet quality cleared with cued cough    Nectar Thick Nectar Thick Liquid: Impaired Presentation: Cup;Spoon Oral Phase Impairments: Impaired anterior to posterior transit;Reduced lingual movement/coordination Oral phase functional implications: Right anterior spillage Pharyngeal Phase Impairments: Suspected delayed Swallow;Multiple swallows   Honey Thick Honey Thick Liquid: Not tested   Puree Puree: Impaired Presentation: Self Fed;Spoon Oral Phase Impairments:  Impaired anterior to posterior transit;Reduced lingual movement/coordination Oral Phase Functional Implications: Prolonged oral transit Pharyngeal Phase Impairments: Multiple swallows;Suspected delayed Swallow   Solid   GO    Solid: Impaired Oral Phase Impairments: Reduced lingual movement/coordination;Impaired anterior to posterior transit;Reduced labial seal;Poor awareness of bolus Oral Phase Functional Implications: Oral residue;Oral holding Pharyngeal Phase Impairments: Suspected delayed Swallow;Multiple swallows Other Comments: pt closing his eyes with food in his mouth, concerning for airway protection, slp used applesauce to aid oral transiting of cracker       Donavan Burnet, MS [facility] SLP [phone]

## 2013-02-22 NOTE — Progress Notes (Signed)
Events from this am noted. Will follow up tomorrow to complete formal evaluation.

## 2013-02-23 ENCOUNTER — Inpatient Hospital Stay (HOSPITAL_COMMUNITY): Payer: Medicare Other

## 2013-02-23 DIAGNOSIS — I6330 Cerebral infarction due to thrombosis of unspecified cerebral artery: Secondary | ICD-10-CM

## 2013-02-23 LAB — CBC
HCT: 40 % (ref 39.0–52.0)
MCH: 29.6 pg (ref 26.0–34.0)
MCHC: 33.5 g/dL (ref 30.0–36.0)
MCV: 88.3 fL (ref 78.0–100.0)
Platelets: 207 10*3/uL (ref 150–400)
RDW: 14 % (ref 11.5–15.5)
WBC: 15.4 10*3/uL — ABNORMAL HIGH (ref 4.0–10.5)

## 2013-02-23 LAB — BASIC METABOLIC PANEL
Calcium: 10.2 mg/dL (ref 8.4–10.5)
GFR calc Af Amer: 22 mL/min — ABNORMAL LOW (ref >90)
GFR calc non Af Amer: 19 mL/min — ABNORMAL LOW (ref >90)
Glucose, Bld: 116 mg/dL — ABNORMAL HIGH (ref 70–99)
Potassium: 4 mEq/L (ref 3.5–5.1)
Sodium: 143 mEq/L (ref 135–145)

## 2013-02-23 MED ORDER — PROMETHAZINE HCL 25 MG/ML IJ SOLN
12.5000 mg | Freq: Once | INTRAMUSCULAR | Status: AC
  Administered 2013-02-23: 12.5 mg via INTRAVENOUS
  Filled 2013-02-23: qty 1

## 2013-02-23 MED ORDER — RESOURCE THICKENUP CLEAR PO POWD
ORAL | Status: DC | PRN
  Filled 2013-02-23: qty 125

## 2013-02-23 MED ORDER — ALBUTEROL SULFATE (5 MG/ML) 0.5% IN NEBU: 5.0000 mg | INHALATION_SOLUTION | Freq: Once | RESPIRATORY_TRACT | Status: DC

## 2013-02-23 NOTE — Evaluation (Signed)
Occupational Therapy Evaluation Patient Details Name: Kevin Escobar MRN: [id number] DOB: [birth year] Today's Date: 02/23/2013 Time: 1027-2536 OT Time Calculation (min): 40 min  OT Assessment / Plan / Recommendation History of present illness Patient is a 72 yo male admitted with Rt-sided weakness, ataxia, and transient speech difficulties.  Patient with h/o recurrent TIA's.   Clinical Impression   PT admitted with Right sided weakness, ataxia, and speech difficulties. PTA Pt was independent in mobility and ADL, very active. Pt currently with functional limitiations due to the deficits listed below (see OT problem list). Pt will benefit from skilled OT to increase their independence and safety with adls and balance and is an excellent candidate for CIR.     OT Assessment  Patient needs continued OT Services    Follow Up Recommendations  CIR       Equipment Recommendations  Other (comment) (to be determined`)    Recommendations for Other Services Rehab consult  Frequency  Min 3X/week    Precautions / Restrictions Precautions Precautions: Fall Restrictions Weight Bearing Restrictions: No   Pertinent Vitals/Pain Pt reported no pain during the session.    ADL  Eating/Feeding: Maximal assistance Where Assessed - Eating/Feeding: Bed level Grooming: Wash/dry face;Moderate assistance Where Assessed - Grooming: Supported sitting Upper Body Bathing: Moderate assistance Where Assessed - Upper Body Bathing: Supported sitting Lower Body Bathing: +1 Total assistance Where Assessed - Lower Body Bathing: Supported sitting Upper Body Dressing: Maximal assistance Where Assessed - Upper Body Dressing: Supported sitting Lower Body Dressing: +1 Total assistance Where Assessed - Lower Body Dressing: Supported sitting Toilet Transfer: +2 Total assistance Toilet Transfer Method: Surveyor, minerals: Materials engineer and Hygiene: +1 Total  assistance Where Assessed - Engineer, mining and Hygiene: Standing Tub/Shower Transfer: +2 Total assistance Tub/Shower Transfer Method: Not assessed Transfers/Ambulation Related to ADLs: not attempted this session ADL Comments: Pt sat EOB for approximately 15 min supported. Pt could hold himself up, but fatigued quickly. Pt able to wash face supported sitting with LUE. Pt with max vc's to keep his head up.    OT Diagnosis: Generalized weakness;Hemiplegia dominant side  OT Problem List: Decreased strength;Decreased range of motion;Decreased activity tolerance;Impaired balance (sitting and/or standing);Decreased coordination;Decreased cognition;Impaired tone;Impaired UE functional use OT Treatment Interventions: Self-care/ADL training;DME and/or AE instruction;Therapeutic activities;Cognitive remediation/compensation;Patient/family education;Balance training   OT Goals(Current goals can be found in the care plan section) Acute Rehab OT Goals Patient Stated Goal: To walk OT Goal Formulation: With patient/family Time For Goal Achievement: 03/09/13 Potential to Achieve Goals: Good ADL Goals Additional ADL Goal #1: Pt will sit EOB maintaining balance fro 10 min prior to ADL Additional ADL Goal #2: Pt will position affected arm prior to mobility with LUE to decrease risk of subluxation  Visit Information  Last OT Received On: 02/23/13 Assistance Needed: +2 History of Present Illness: Patient is a 72 yo male admitted with Rt-sided weakness, ataxia, and transient speech difficulties.  Patient with h/o recurrent TIA's.       Prior Functioning     Home Living Family/patient expects to be discharged to:: Inpatient rehab Living Arrangements: Spouse/significant other Available Help at Discharge: Family;Available 24 hours/day Type of Home: House Home Access: Stairs to enter [employer] of Steps: 4 Entrance Stairs-Rails: Right Home Layout: One level Home Equipment:  Cane - quad;Cane - single point Prior Function Level of Independence: Independent Comments: Pt was driving, independent in all ADL and mobility Communication Communication: No difficulties Dominant Hand: Right  Vision/Perception Vision - History Baseline Vision: Wears glasses only for reading Visual History: Cataracts;Corrective eye surgery Patient Visual Report: No change from baseline Vision - Assessment Eye Alignment: Within Functional Limits Vision Assessment: Vision not tested   Cognition  Cognition Arousal/Alertness: Lethargic Behavior During Therapy: WFL for tasks assessed/performed Overall Cognitive Status: Within Functional Limits for tasks assessed    Extremity/Trunk Assessment Upper Extremity Assessment Upper Extremity Assessment: RUE deficits/detail RUE Deficits / Details: Strength 2+/5 generally in hand and 2-/5 in elbow RUE Coordination: decreased fine motor;decreased gross motor Lower Extremity Assessment Lower Extremity Assessment: Defer to PT evaluation RLE Deficits / Details: Pt's leg was tight and did not want to bend     Mobility Bed Mobility Bed Mobility: Rolling Right;Right Sidelying to Sit;Sitting - Scoot to Edge of Bed;Sit to Supine Rolling Right: 4: Min assist;With rail Right Sidelying to Sit: 3: Mod assist;HOB flat;With rails Sitting - Scoot to Edge of Bed: 3: Mod assist Details for Bed Mobility Assistance: Verbal cues for technique and sequencing.  Assist to move trunk to sitting position.  Encouraged use of RUE to assist.  Patient sat EOB x 15 minutes for sitting balance.  Reports he is "feeling badly", so returned to supine.  Max assuist to get legs back in the bed.           End of Session OT - End of Session Activity Tolerance: Patient limited by fatigue Patient left: in bed;with call bell/phone within reach;with family/visitor present Nurse Communication: Mobility status  GO     Sherryl Manges 02/23/2013, 2:01 PM

## 2013-02-23 NOTE — Progress Notes (Signed)
Late Entry: Around 2130, pt started vomited multiple times. Thick mucus present in mouth. Zofran 4mg  IV given at 2159. Pt began vomiting again at 0103. Tama Gander, NP paged. Phenergan 12.5 mg IV ordered to be given. Pt sleeping and has not vomited since. Will continue to monitor. Suction set up in room. Salvadore Oxford, RN 02/23/13 [phone]

## 2013-02-23 NOTE — Progress Notes (Signed)
[facility] PROGRESS NOTE  Kevin Escobar ZOX:[id number] DOB: 08-23-1940 DOA: 02/20/2013 PCP: No primary provider on file.  Assessment/Plan: #1. Acute CVA Patient with worsening sxs of r facial weakness, dysarthria and worsening RLE weakness. Patient was on aspirin prior to admission. Head CT is negative. MRI c/w CVA. 2-D echo with EF 60-65% with NWMA and no source of emboli. FLP with LDL 101. Carotid Dopplers preliminary result with no significant ICA stenosis. Continue Plavix per neurology recommendations. PT/OT/ST. Risk factor modification. Neurology following and appreciate input and recommendations.  #2 hypertension Permissive hypertension. Norvasc on hold. Continue clonidine. IV Lopressor. Resume norvasc tomm if BP still elevated.  #3 chronic kidney disease Stable. Creatinine currently at 3. Baseline creatinine approximately 2.5-3. Will discontinue Lasix and follow.  #4 peripheral vascular disease status post renal stent and AAA repair Stable. Patient has been changed from aspirin to Plavix. Follow.  #5 depression Stable.  #6 hyperlipidemia Continue Zocor.  #7 Hypokalemia secondary to diureses. Replete.   #8 prophylaxis SCDs for DVT prophylaxis.  Code Status: Full Family Communication: Updated patient. No family at bedside. Disposition Plan: CIR vs SNF when medically stable.   Consultants:  Neurology: Dr Cyril Mourning 02/21/13  Procedures:  MRI pending  CT head 02/20/13  CXR 02/21/13  2-D echo 02/21/2013  Carotid Dopplers 02/21/2013  V/Q scan 02/22/13  Antibiotics:  None  HPI/Subjective: Patient with RLE weakness. Patient with dysarthria and R facial weakness unchanged from yesterday.  Objective: Filed Vitals:   02/22/13 2200 02/23/13 0221 02/23/13 0637 02/23/13 0944  BP: 168/102 159/99 172/89 176/104  Pulse: 67 81 85 79  Temp: 97.7 F (36.5 C) 98.1 F (36.7 C) 97.9 F (36.6 C) 97.6 F (36.4 C)  TempSrc:  Oral Oral Oral  Resp: 20 20 19 20    Height:      Weight:      SpO2: 90% 95% 94% 93%    Intake/Output Summary (Last 24 hours) at 02/23/13 1235 Last data filed at 02/23/13 0816  Gross per 24 hour  Intake    240 ml  Output      0 ml  Net    240 ml   Filed Weights   02/20/13 1604 02/20/13 2054  Weight: 68.04 kg (150 lb) 67.7 kg (149 lb 4 oz)    Exam:   General:  nad  Cardiovascular: rrr  Respiratory: Left basilar crackles. No wheezing  Abdomen: Soft/NT/ND/+BS  Neuro: RLE weakness. RUE weakness. Dysarthria, R facial weakness.  Data Reviewed: Basic Metabolic Panel:  Recent Labs Lab 02/20/13 1631 02/20/13 1654 02/21/13 0829 02/22/13 0540 02/22/13 0800 02/23/13 0555  NA 137 142 139 138  --  143  K 3.2* 3.2* 3.3* 3.1*  --  4.0  CL 102 107 104 102  --  107  CO2 21  --  21 23  --  24  GLUCOSE 122* 118* 87 144*  --  116*  BUN 40* 39* 38* 37*  --  38*  CREATININE 2.96* 2.70* 2.88* 2.71*  --  3.00*  CALCIUM 9.5  --  9.7 10.2  --  10.2  MG  --   --   --   --  2.2  --    Liver Function Tests:  Recent Labs Lab 02/20/13 1631  AST 10  ALT 10  ALKPHOS 62  BILITOT 0.2*  PROT 6.5  ALBUMIN 3.2*   No results found for this basename: LIPASE, AMYLASE,  in the last 168 hours No results found for this basename: AMMONIA,  in  the last 168 hours CBC:  Recent Labs Lab 02/20/13 1631 02/20/13 1654  WBC 7.5  --   NEUTROABS 4.2  --   HGB 12.8* 12.9*  HCT 37.6* 38.0*  MCV 87.4  --   PLT 172  --    Cardiac Enzymes: No results found for this basename: CKTOTAL, CKMB, CKMBINDEX, TROPONINI,  in the last 168 hours BNP (last 3 results) No results found for this basename: PROBNP,  in the last 8760 hours CBG:  Recent Labs Lab 02/22/13 1132 02/22/13 1654 02/22/13 2156 02/23/13 0706 02/23/13 1148  GLUCAP 124* 117* 127* 112* 115*    No results found for this or any previous visit (from the past 240 hour(s)).   Studies: Ct Head Wo Contrast  02/22/2013   *RADIOLOGY REPORT*  Clinical Data: Worsening  right-sided weakness.  CT HEAD WITHOUT CONTRAST  Technique:  Contiguous axial images were obtained from the base of the skull through the vertex without contrast.  Comparison: CT scan 02/20/2013 and MRI 02/21/2013.  Findings: Stable age related cerebral atrophy, ventriculomegaly and periventricular white matter disease.  There are remote lacunar type infarcts and a remote left occipital cortical infarct.  The left basal ganglia lacunar type infarct is again demonstrated.  No complicating features such as hemorrhage.  No findings for acute hemispheric infarction and no mass lesions.  The brainstem and cerebellum are stable.  Stable patchy sinus disease.  IMPRESSION:  1.  Stable age related cerebral atrophy, ventriculomegaly and periventricular white matter disease. 2.  Remote lacunar type basal ganglia infarcts and remote left occipital cortical infarct. 3.  Acute left basal ganglia lacunar type infarct without complicating features. 4.  No acute hemispheric infarction or intracranial hemorrhage.   Original Report Authenticated By: Rudie Meyer, M.D.   Mr Maxine Glenn Head Wo Contrast  02/21/2013   *RADIOLOGY REPORT*  Clinical Data:  72 year old male with right side weakness, altered speech.  Comparison: Head CT without contrast 02/20/2013.  Brain MRI and MRA 06/05/2011.  MRI HEAD WITHOUT CONTRAST  Technique: Multiplanar, multiecho pulse sequences of the brain and surrounding structures were obtained according to standard protocol without intravenous contrast.  Findings: Study is degraded by motion artifact despite repeated imaging attempts.  Cerebral volume has not significantly changed.  10 mm focus of restricted diffusion in the left internal capsule posterior limb (series 3 image 13).  Mild associated T2 and FLAIR hyperintensity.  No mass effect or associated hemorrhage.  No other diffusion restriction. Major intracranial vascular flow voids are stable.  Chronic lacunar infarcts in both medial thalami are new since  2012. Otherwise stable moderately advanced chronic small vessel ischemia in and around the deep gray matter nuclei.  Small chronic left occipital lobe infarct is stable.  Multiple small chronic lacunar infarcts in both cerebellar hemispheres, better demonstrated on the previous study due to motion today.  No midline shift, ventriculomegaly, mass effect, evidence of mass lesion, extra-axial collection or acute intracranial hemorrhage. Cervicomedullary junction and pituitary are within normal limits. C3-C4 disc degeneration. Visualized bone marrow signal is within normal limits.  Interval postoperative changes to the right globe.  Otherwise stable orbit soft tissues.  Interval decreased paranasal sinus mucosal thickening.  Mastoids are clear.  IMPRESSION: 1.  Acute lacunar infarct left posterior limb internal capsule.  No mass effect or hemorrhage. 2.  Underlying advanced chronic small and medium size vessel ischemia, with some progression since 2012. 3.  MRA findings are below.  MRA HEAD WITHOUT CONTRAST  Technique: Angiographic images of the Circle of  [place] were obtained using MRA technique without  intravenous contrast.  Findings: Stable antegrade flow in the posterior circulation with dominant distal right vertebral artery, and the left vertebral functionally terminating PICA.  Normal right PICA.  Stable basilar artery without stenosis.  A somewhat unusual dual PCA supply from both the posterior communicating arteries and P1 segments (inferior and superior PCA divisions supplied separately) is re-identified.  Bilateral PCA branches are stable and within normal limits. No proximal left PCA irregularity to correspond to the acute MRI findings.  Stable antegrade flow in both ICA siphon.  Ophthalmic and posterior communicating artery origins are within normal limits.  Carotid termini are stable.  MCA origins are stable.  The left ACA A1 segment is dominant.  Small left lenticulostriate artery infundibulum arises from  the A1 segment and is more apparent on today's MIP images, but stable.  Bilateral MCA branches are stable and within normal limits. Anterior communicating artery and bilateral ACA branches are stable and within normal limits.  IMPRESSION: Stable and negative intracranial MRA.   Original Report Authenticated By: Erskine Speed, M.D.   Mr Brain Wo Contrast  02/21/2013   *RADIOLOGY REPORT*  Clinical Data:  72 year old male with right side weakness, altered speech.  Comparison: Head CT without contrast 02/20/2013.  Brain MRI and MRA 06/05/2011.  MRI HEAD WITHOUT CONTRAST  Technique: Multiplanar, multiecho pulse sequences of the brain and surrounding structures were obtained according to standard protocol without intravenous contrast.  Findings: Study is degraded by motion artifact despite repeated imaging attempts.  Cerebral volume has not significantly changed.  10 mm focus of restricted diffusion in the left internal capsule posterior limb (series 3 image 13).  Mild associated T2 and FLAIR hyperintensity.  No mass effect or associated hemorrhage.  No other diffusion restriction. Major intracranial vascular flow voids are stable.  Chronic lacunar infarcts in both medial thalami are new since 2012. Otherwise stable moderately advanced chronic small vessel ischemia in and around the deep gray matter nuclei.  Small chronic left occipital lobe infarct is stable.  Multiple small chronic lacunar infarcts in both cerebellar hemispheres, better demonstrated on the previous study due to motion today.  No midline shift, ventriculomegaly, mass effect, evidence of mass lesion, extra-axial collection or acute intracranial hemorrhage. Cervicomedullary junction and pituitary are within normal limits. C3-C4 disc degeneration. Visualized bone marrow signal is within normal limits.  Interval postoperative changes to the right globe.  Otherwise stable orbit soft tissues.  Interval decreased paranasal sinus mucosal thickening.  Mastoids  are clear.  IMPRESSION: 1.  Acute lacunar infarct left posterior limb internal capsule.  No mass effect or hemorrhage. 2.  Underlying advanced chronic small and medium size vessel ischemia, with some progression since 2012. 3.  MRA findings are below.  MRA HEAD WITHOUT CONTRAST  Technique: Angiographic images of the Circle of [place] were obtained using MRA technique without  intravenous contrast.  Findings: Stable antegrade flow in the posterior circulation with dominant distal right vertebral artery, and the left vertebral functionally terminating PICA.  Normal right PICA.  Stable basilar artery without stenosis.  A somewhat unusual dual PCA supply from both the posterior communicating arteries and P1 segments (inferior and superior PCA divisions supplied separately) is re-identified.  Bilateral PCA branches are stable and within normal limits. No proximal left PCA irregularity to correspond to the acute MRI findings.  Stable antegrade flow in both ICA siphon.  Ophthalmic and posterior communicating artery origins are within normal limits.  Carotid termini are stable.  MCA origins are stable.  The left ACA A1 segment is dominant.  Small left lenticulostriate artery infundibulum arises from the A1 segment and is more apparent on today's MIP images, but stable.  Bilateral MCA branches are stable and within normal limits. Anterior communicating artery and bilateral ACA branches are stable and within normal limits.  IMPRESSION: Stable and negative intracranial MRA.   Original Report Authenticated By: Erskine Speed, M.D.   Nm Pulmonary Perfusion  02/22/2013   *RADIOLOGY REPORT*  Clinical Data:  NUCLEAR MEDICINE - PERFUSION LUNG SCAN  Technique:   Perfusion images were obtained in multiple projections after intravenous injection of Tc-35m MAA.  Ventilation scans were not performed.  Radiopharmaceuticals:  3.0 mCi Tc-27m MAA.  Comparison:  Chest x-ray 02/21/2013  Findings: The perfusion lung scan demonstrates a few  patchy perfusion defects.  No segmental or subsegmental defects are suggested to suggest pulmonary emboli.  IMPRESSION: Low probability perfusion lung scan for pulmonary embolism.   Original Report Authenticated By: Rudie Meyer, M.D.   Dg Swallowing Func-speech Pathology  02/23/2013   Chales Abrahams, CCC-SLP     02/23/2013 10:26 AM Objective Swallowing Evaluation: Modified Barium Swallowing Study   Patient Details  Name: Kevin Escobar MRN: [id number] Date of Birth: 1941/06/09  Today's Date: 02/23/2013 Time: 9528-4132 SLP Time Calculation (min): 12 min  Past Medical History:  Past Medical History  Diagnosis Date  . Cancer of bladder 2010  . Cancer of prostate 1994  . Depression   . Chronic kidney disease   . Hypertension   . TIA (transient ischemic attack) 2008  . Peripheral vascular disease   . PVD (peripheral vascular disease) 02/21/2013   Past Surgical History:  Past Surgical History  Procedure Laterality Date  . Abdominal aortic aneurysm repair  1995  . Prostate surgery  1994  . Colon surgery  2010   HPI:  Kevin Escobar is an 72 y.o. male with a past medical history  significant for HTN, TIA, CKD, renal artery stenosis s/p stent  placement, AAA repair in 1995, prostate and bladder cancer,  admitted to [facility] with new onset right hemiparesis and 2 episodes of  transient language impairment..  Pt  was seen by Speech  pathologist on Sunday with recommendations for dys2/thin,  overtnight he had episode of vomiting and "gurgling" after intake  of water, also with increased weakness, facial droop on right and  slurred speech.  MRI completed that showed left basal ganglia  cva.  BSE repeated yesterday with diet of puree/thin initiated,   MBS indicated due to concern for dysphagia even PTA.       Assessment / Plan / Recommendation Clinical Impression  Clinical impression: Limited assessment (tsp of nectar liquid  given only) secondary to pt demonstrating AMS immediately after  swallow of first bolus.  Pt had been  fully alert, eyes open,  after first swallow he closed his eyes, was lethargic, and SOB;  he complained of "feeling woozy".  MBS was ceased at that time,  SLP phoned floor to inform nurse while xray techs transported pt  back to room.   Pt demonstrated mild delay in oral transiting and  pharyngeal swallow initiation at approx 1 second with nectar to  vallecular space.  Also mild residuals present at tongue base  without pt awareness.  Testing was dc'd at that time due to  medical concerns.   Pt was observed by SLP to be overtly coughing during breakfast  intake today most notably with thin  liquids- suspect aspiration  due to overt coughing, swallow delay and residuals noted on MBS.   Rec pt diet be modified to mitigate aspiration risk puree/nectar.   Pt would benefit from repeat MBS within the next few days.     Treatment Recommendation  Therapy as outlined in treatment plan below    Diet Recommendation Dysphagia 1 (Puree);Nectar-thick liquid;Ice  chips PRN after oral care   Liquid Administration via: Cup;No straw Medication Administration: Whole meds with puree Supervision: Full supervision/cueing for compensatory  strategies;Staff feed patient;Trained caregiver to feed patient Compensations: Slow rate;Small sips/bites;Multiple dry swallows  after each bite/sip;Hard cough after swallow;Clear throat  intermittently, check for oral pocketing on right Postural Changes and/or Swallow Maneuvers: Seated upright 90  degrees    Other  Recommendations Oral Care Recommendations: Oral care BID Other Recommendations: Clarify dietary restrictions   Follow Up Recommendations       Frequency and Duration min 2x/week  1 week   Pertinent Vitals/Pain Afebrile, decreased, congested cough    SLP Swallow Goals Patient will utilize recommended strategies during swallow to  increase swallowing safety with: Minimal assistance Swallow Study Goal #2 - Progress: Progressing toward goal   General Date of Onset: 02/20/13 HPI: Kevin Escobar  is an 72 y.o. male with a past medical  history significant for HTN, TIA, CKD, renal artery stenosis s/p  stent placement, AAA repair in 1995, prostate and bladder cancer,  admitted to [facility] with new onset right hemiparesis and 2 episodes of  transient language impairment..  Pt  was seen by Speech  pathologist on Sunday with recommendations for dys2/thin,  overtnight he had episode of vomiting and "gurgling" after intake  of water, also with increased weakness, facial droop on right and  slurred speech.  MRI completed that showed left basal ganglia  cva.  BSE repeated yesterday with diet of puree/thin initiated,   MBS indicated due to concern for dysphagia even PTA.   Type of Study: Modified Barium Swallowing Study Reason for Referral: Objectively evaluate swallowing function Diet Prior to this Study: Dysphagia 1 (puree);Thin liquids Respiratory Status: Room air History of Recent Intubation: No Behavior/Cognition: Alert;Cooperative;Distractible;Pleasant  mood;Requires cueing Oral Cavity - Dentition: Adequate natural dentition Oral Motor / Sensory Function: Impaired - see Bedside swallow  eval Self-Feeding Abilities: Needs assist Patient Positioning: Upright in chair Baseline Vocal Quality: Clear Volitional Cough: Congested;Wet (pt has a "smoker's cough" per  spouse) Volitional Swallow: Able to elicit Anatomy: Within functional limits Pharyngeal Secretions: Not observed secondary MBS    Reason for Referral Objectively evaluate swallowing function   Oral Phase Oral Preparation/Oral Phase Oral Phase: Impaired Oral - Nectar Oral - Nectar Teaspoon: Reduced posterior propulsion;Weak lingual  manipulation   Pharyngeal Phase Pharyngeal Phase Pharyngeal Phase: Impaired Pharyngeal - Nectar Pharyngeal - Nectar Teaspoon: Delayed swallow  initiation;Premature spillage to valleculae;Pharyngeal residue -  valleculae  Cervical Esophageal Phase    GO    Cervical Esophageal Phase Cervical Esophageal Phase: WFL (for barium provided)         Donavan Burnet, MS [facility] SLP [phone]     Scheduled Meds: . cloNIDine  0.1 mg Oral Q1200  . clopidogrel  75 mg Oral Q breakfast  . enoxaparin (LOVENOX) injection  30 mg Subcutaneous Q24H  . metoprolol  5 mg Intravenous Q8H  . simvastatin  40 mg Oral q1800  . sodium chloride  3 mL Intravenous Q12H   Continuous Infusions:   Principal Problem:   CVA (cerebral vascular accident) Active Problems:   Chronic  kidney disease   Hypertension   Peripheral vascular disease   Depression   PVD (peripheral vascular disease)   Hypokalemia    Time spent: > 35 mins    [facility]  [facility] Pager [phone]. If 7PM-7AM, please contact night-coverage at [email or web address], password [facility] 02/23/2013, 12:35 PM  LOS: 3 days

## 2013-02-23 NOTE — Significant Event (Signed)
Rapid Response Event Note  Overview: Time Called: 2238 Arrival Time: 2245 Event Type: Respiratory  Initial Focused Assessment: Called by bedside RN regarding patient's complaint of shortness of breath. Patient states he is having a difficult time getting a full breath and finishing sentences. Upon arrival to room patient HR 97, 98% on [place] 2L, RR 22, BP 165/107. Patient has nonproductive cough. Patient had recent swallow study earlier in day and had coughing episode after drinking coffee in the morning.   Interventions: NP on call notified, portable chest xray ordered per protocol. Will continue to monitor, advised bedside RN to call if further assistance needed. Orders received for albuterol   Event Summary: Name of Physician Notified: Junious Silk, NP at 2240    at    Outcome: Stayed in room and stabalized     [place], [place]

## 2013-02-23 NOTE — Progress Notes (Signed)
Stroke Team Progress Note  HISTORY Kevin Escobar is a 72 y.o. male with a past medical history significant for HTN, TIA, CKD, renal artery stenosis s/p stent placement, AAA repair in 1995, prostate and bladder cancer, admitted to [facility] with new onset right hemiparesis and 2 episodes of transient language impairment.  His wife tells me that the day before admission 02/19/2013 he was not walking good and fell because he was not able to use his right leg properly. Then he awoke 02/20/2013 and started having gradual progressing generalized weakness. At around 1:00 he had another episode of difficulty speaking and more localized weakness involving the right hemibody and he agreed to come to the ED on 02/20/2013. A CT brain upon arrival to ED showed no acute intracranial abnormality. On aspirin 325 mg daily and simvastatin 40 mg daily. Neuro was consulted 02/21/2013. Patient was not a TPA candidate secondary to delay in arrival.  SUBJECTIVE The patient's wife is present this morning. The nurse reports that the patient had a barium swallow this morning and was more lethargic when he returned from the study. He also has some mild shortness of breath.  OBJECTIVE Most recent Vital Signs: Filed Vitals:   02/22/13 2200 02/23/13 0221 02/23/13 0637 02/23/13 0944  BP: 168/102 159/99 172/89 176/104  Pulse: 67 81 85 79  Temp: 97.7 F (36.5 C) 98.1 F (36.7 C) 97.9 F (36.6 C) 97.6 F (36.4 C)  TempSrc:  Oral Oral Oral  Resp: 20 20 19 20   Height:      Weight:      SpO2: 90% 95% 94% 93%   CBG (last 3)   Recent Labs  02/22/13 1654 02/22/13 2156 02/23/13 0706  GLUCAP 117* 127* 112*    IV Fluid Intake:     MEDICATIONS  . cloNIDine  0.1 mg Oral Q1200  . clopidogrel  75 mg Oral Q breakfast  . enoxaparin (LOVENOX) injection  30 mg Subcutaneous Q24H  . metoprolol  5 mg Intravenous Q8H  . simvastatin  40 mg Oral q1800  . sodium chloride  3 mL Intravenous Q12H   PRN:  sodium chloride, acetaminophen,  acetaminophen, hydrALAZINE, LORazepam, ondansetron, RESOURCE THICKENUP CLEAR, senna-docusate, sodium chloride, traZODone  Diet:  Dysphagia I with nectar thick liquids Activity:  OOB with assistance,  Up with assistance DVT Prophylaxis:  SCDs   CLINICALLY SIGNIFICANT STUDIES Basic Metabolic Panel:   Recent Labs Lab 02/22/13 0540 02/22/13 0800 02/23/13 0555  NA 138  --  143  K 3.1*  --  4.0  CL 102  --  107  CO2 23  --  24  GLUCOSE 144*  --  116*  BUN 37*  --  38*  CREATININE 2.71*  --  3.00*  CALCIUM 10.2  --  10.2  MG  --  2.2  --    Liver Function Tests:   Recent Labs Lab 02/20/13 1631  AST 10  ALT 10  ALKPHOS 62  BILITOT 0.2*  PROT 6.5  ALBUMIN 3.2*   CBC:   Recent Labs Lab 02/20/13 1631 02/20/13 1654  WBC 7.5  --   NEUTROABS 4.2  --   HGB 12.8* 12.9*  HCT 37.6* 38.0*  MCV 87.4  --   PLT 172  --    Coagulation:   Recent Labs Lab 02/20/13 1631  LABPROT 12.6  INR 0.96   Cardiac Enzymes: No results found for this basename: CKTOTAL, CKMB, CKMBINDEX, TROPONINI,  in the last 168 hours Urinalysis:   Recent Labs Lab  02/20/13 1804  COLORURINE YELLOW  LABSPEC 1.010  PHURINE 6.0  GLUCOSEU NEGATIVE  HGBUR NEGATIVE  BILIRUBINUR NEGATIVE  KETONESUR NEGATIVE  PROTEINUR 100*  UROBILINOGEN 0.2  NITRITE NEGATIVE  LEUKOCYTESUR NEGATIVE   Lipid Panel    Component Value Date/Time   CHOL 176 02/22/2013 0540   TRIG 218* 02/22/2013 0540   HDL 31* 02/22/2013 0540   CHOLHDL 5.7 02/22/2013 0540   VLDL 44* 02/22/2013 0540   LDLCALC 101* 02/22/2013 0540   HgbA1C  Lab Results  Component Value Date   HGBA1C 5.6 02/21/2013    Urine Drug Screen:     Component Value Date/Time   LABOPIA NONE DETECTED 02/20/2013 1803   COCAINSCRNUR NONE DETECTED 02/20/2013 1803   LABBENZ NONE DETECTED 02/20/2013 1803   AMPHETMU NONE DETECTED 02/20/2013 1803   THCU NONE DETECTED 02/20/2013 1803   LABBARB NONE DETECTED 02/20/2013 1803    Alcohol Level: No results found for this  basename: ETH,  in the last 168 hours  CT of the brain   02/20/2013    No acute intracranial abnormalities.  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  Chronic microvascular ischemic change  02/22/2013    1.  Stable age related cerebral atrophy, ventriculomegaly and periventricular white matter disease. 2.  Remote lacunar type basal ganglia infarcts and remote left occipital cortical infarct. 3.  Acute left basal ganglia lacunar type infarct without complicating features. 4.  No acute hemispheric infarction or intracranial hemorrhage.     MRI of the brain 02/21/2013   1.  Acute lacunar infarct left posterior limb internal capsule.  No mass effect or hemorrhage. 2.  Underlying advanced chronic small and medium size vessel ischemia, with some progression since 2012.   MRA of the brain  02/21/2013  Stable and negative intracranial MRA.     2D Echocardiogram  EF 60-65% with no source of embolus.   Carotid Doppler  There is 0-39% ICA stenosis. Vertebral artery flow is antegrade.  CXR  02/21/2013   No active cardiopulmonary abnormalities.    EKG  sinus tachycardia.   Therapy Recommendations CIR  Physical Exam   Frail elderly male not in distress.Awake alert. Afebrile. Head is nontraumatic. Neck is supple without bruit. Hearing is normal. Cardiac exam no murmur or gallop. Lungs are clear to auscultation. Distal pulses are well felt. Neurological Exam ;  Awake alert oriented x 3 normal speech and language. Mild right lower face asymmetry. Tongue midline. No drift. Mild diminished fine finger movements on right. Orbits left  Over right upper extremity. RUE 2/5 and RLE 1/5 weakness.Severe right grip weak..Decrease right hemibody sensation . Impaired right sided coordination.   ASSESSMENT Kevin Escobar is a 72 y.o. male presenting with right hemiparesis and 2 episodes of transient language impairment . Imaging confirms a left PLIC infarct (this is a lacunar infarct). Infarct felt to be thrombotic  secondary to small vessel disease.  On aspirin 325 mg orally every day prior to admission. Now on clopidogrel 75 mg orally every day for secondary stroke prevention. Patient with resultant right hemiparesis and aphasia that has worsened during hospitalization. (The frequency of progression of neurological deficits after onset is most common in patients with lacunar infarcts (37%). The only treatment shown to be effective in patients with progressing lacunar infarcts is augmentation of cerebral blood flow, though this has not been studied in a controlled trial.)  Hypertension Hyperlipidemia, LDL 101, on zocor 10 PTA, now on zocor 40, goal LDL < 100 (< 70 for diabetics) Hx  TIA PVD - s/p renal stent and AAA repair CKD Leukocytosis - CBC pending  Hospital day # 3  TREATMENT/PLAN  Continue clopidogrel 75 mg orally every day for secondary stroke prevention.  Add lovenox for VTE prophylaxis  Dr. Hermelinda Medicus has seen the patient and agrees with inpatient rehabilitation admission.  Per Dr. Pearlean Brownie the patient is to remain on bedrest today although he can have the head of his bed elevated.  Barium swallow performed this morning. The patient's diet was advanced.  NS @ 75h  Hassel Neth [facility] Pager [phone] 02/23/2013, 10:57 AM  I have personally obtained a history, examined the patient, evaluated imaging results, and formulated the assessment and plan of care. I agree with the above. Delia Heady, MD

## 2013-02-23 NOTE — Progress Notes (Signed)
PT Cancellation Note  Patient Details Name: Kevin Escobar MRN: [id number] DOB: Sep 08, 1940   Cancelled Treatment:    Reason Eval/Treat Not Completed: Patient not medically ready;Other (comment) (on bedrest as of this afternoon) .  Will see as able 7/23. 02/23/2013  Lehigh [name], PT [phone]  (pager)   Jaystin Mcgarvey, Eliseo Gum 02/23/2013, 4:43 PM

## 2013-02-23 NOTE — Procedures (Addendum)
Objective Swallowing Evaluation: Modified Barium Swallowing Study  Patient Details  Name: Kevin Escobar MRN: [id number] Date of Birth: 1941-07-18  Today's Date: 02/23/2013 Time: 4098-1191 SLP Time Calculation (min): 12 min  Past Medical History:  Past Medical History  Diagnosis Date  . Cancer of bladder 2010  . Cancer of prostate 1994  . Depression   . Chronic kidney disease   . Hypertension   . TIA (transient ischemic attack) 2008  . Peripheral vascular disease   . PVD (peripheral vascular disease) 02/21/2013   Past Surgical History:  Past Surgical History  Procedure Laterality Date  . Abdominal aortic aneurysm repair  1995  . Prostate surgery  1994  . Colon surgery  2010   HPI:  Kevin Escobar is an 72 y.o. male with a past medical history significant for HTN, TIA, CKD, renal artery stenosis s/p stent placement, AAA repair in 1995, prostate and bladder cancer, admitted to [facility] with new onset right hemiparesis and 2 episodes of transient language impairment..  Pt  was seen by Speech pathologist on Sunday with recommendations for dys2/thin, overtnight he had episode of vomiting and "gurgling" after intake of water, also with increased weakness, facial droop on right and slurred speech.  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  BSE repeated yesterday with diet of puree/thin initiated,  MBS indicated due to concern for dysphagia even PTA.       Assessment / Plan / Recommendation Clinical Impression  Clinical impression: Limited assessment (tsp of nectar liquid given only) secondary to pt demonstrating AMS immediately after swallow of first bolus.  Pt had been fully alert, eyes open, after first swallow he closed his eyes, was lethargic, and SOB; he complained of "feeling woozy".  MBS was ceased at that time, SLP phoned floor to inform nurse while xray techs transported pt back to room.   Pt demonstrated mild delay in oral transiting and pharyngeal swallow initiation at  approx 1 second with nectar to vallecular space.  Also mild residuals present at tongue base without pt awareness.  Testing was dc'd at that time due to medical concerns.   Pt was observed by SLP to be overtly coughing during breakfast intake today most notably with thin liquids- suspect aspiration due to overt coughing, swallow delay and residuals noted on MBS.   Rec pt diet be modified to mitigate aspiration risk puree/nectar.  Pt would benefit from repeat MBS within the next few days.     Treatment Recommendation  Therapy as outlined in treatment plan below    Diet Recommendation Dysphagia 1 (Puree);Nectar-thick liquid;Ice chips PRN after oral care   Liquid Administration via: Cup;No straw Medication Administration: Whole meds with puree Supervision: Full supervision/cueing for compensatory strategies;Staff feed patient;Trained caregiver to feed patient Compensations: Slow rate;Small sips/bites;Multiple dry swallows after each bite/sip;Hard cough after swallow;Clear throat intermittently, check for oral pocketing on right Postural Changes and/or Swallow Maneuvers: Seated upright 90 degrees    Other  Recommendations Oral Care Recommendations: Oral care BID Other Recommendations: Clarify dietary restrictions   Follow Up Recommendations       Frequency and Duration min 2x/week  1 week   Pertinent Vitals/Pain Afebrile, decreased, congested cough    SLP Swallow Goals Patient will utilize recommended strategies during swallow to increase swallowing safety with: Minimal assistance Swallow Study Goal #2 - Progress: Progressing toward goal   General Date of Onset: 02/20/13 HPI: Kevin Escobar is an 72 y.o. male with a past medical history significant for HTN,  TIA, CKD, renal artery stenosis s/p stent placement, AAA repair in 1995, prostate and bladder cancer, admitted to [facility] with new onset right hemiparesis and 2 episodes of transient language impairment..  Pt  was seen by Speech  pathologist on Sunday with recommendations for dys2/thin, overtnight he had episode of vomiting and "gurgling" after intake of water, also with increased weakness, facial droop on right and slurred speech.  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  BSE repeated yesterday with diet of puree/thin initiated,  MBS indicated due to concern for dysphagia even PTA.   Type of Study: Modified Barium Swallowing Study Reason for Referral: Objectively evaluate swallowing function Diet Prior to this Study: Dysphagia 1 (puree);Thin liquids Respiratory Status: Room air History of Recent Intubation: No Behavior/Cognition: Alert;Cooperative;Distractible;Pleasant mood;Requires cueing Oral Cavity - Dentition: Adequate natural dentition Oral Motor / Sensory Function: Impaired - see Bedside swallow eval Self-Feeding Abilities: Needs assist Patient Positioning: Upright in chair Baseline Vocal Quality: Clear Volitional Cough: Congested;Wet (pt has a "smoker's cough" per spouse) Volitional Swallow: Able to elicit Anatomy: Within functional limits Pharyngeal Secretions: Not observed secondary MBS    Reason for Referral Objectively evaluate swallowing function   Oral Phase Oral Preparation/Oral Phase Oral Phase: Impaired Oral - Nectar Oral - Nectar Teaspoon: Reduced posterior propulsion;Weak lingual manipulation   Pharyngeal Phase Pharyngeal Phase Pharyngeal Phase: Impaired Pharyngeal - Nectar Pharyngeal - Nectar Teaspoon: Delayed swallow initiation;Premature spillage to valleculae;Pharyngeal residue - valleculae  Cervical Esophageal Phase    GO    Cervical Esophageal Phase Cervical Esophageal Phase: WFL (for barium provided)        Donavan Burnet, MS [facility] SLP [phone]

## 2013-02-23 NOTE — Evaluation (Signed)
I agree with the following treatment note after reviewing documentation.   Johnston, Kraig Genis Brynn   OTR/L Pager: [phone] Office: [phone] .   

## 2013-02-23 NOTE — Progress Notes (Addendum)
Speech Language Pathology Dysphagia Treatment Patient Details Name: Kevin Escobar MRN: [id number] DOB: 03/18/1941 Today's Date: 02/23/2013 Time: 0950-1001 SLP Time Calculation (min): 11 min  Assessment / Plan / Recommendation Clinical Impression  Treatment session focused on educating spouse and pt to findings of limited MBS, clinical reasoning for diet modifications and clinical observation of po tolerance.  Pt's spouse reiterates that pt has a smoker's cough - informed her of swallow delay and stasis on MBS indicative of sensorimotor deficits with increased concern for aspiration.    Observed pt consuming thin drinks with meal this am *briefly* with pt overtly coughing during intake.  Spouse acknowledges pt has had less intake recently prior to admission.  Observed pt consuming ice chips without overt s/s of aspiration.   Mildly wet voice noted after swallow that may indicate laryngeal penetration, voice cleared with cued throat clear/cough.  Rec allow pt small ice chips for comfort with intermittent throat clearing after consumption.    Pt's spouse stated "He just keeps going down" regarding diet modifiers and appeared frustrated.  Encouraged pt and spouse to hopeful for temporary diet changes and advancement likely with continued recovery from CVA.   Modified diet to puree/nectar/ice and will follow for readiness to reattempt MBS.      SLE deferred secondary to event in xray and increased dysarthria/lethargy today.   Noted pt vomited again early this am.      Diet Recommendation  Initiate / Change Diet: Dysphagia 1 (puree);Nectar-thick liquid    SLP Plan Continue with current plan of care   Pertinent Vitals/Pain Afebrile, congested cough   Swallowing Goals  SLP Swallowing Goals Patient will utilize recommended strategies during swallow to increase swallowing safety with: Minimal assistance Swallow Study Goal #2 - Progress: Progressing toward goal  General Temperature Spikes Noted:  No Respiratory Status: Room air Behavior/Cognition: Alert;Cooperative;Agitated;Impulsive;Distractible (sleepy but did participate) Oral Cavity - Dentition: Adequate natural dentition Patient Positioning: Upright in bed  Oral Cavity - Oral Hygiene Does patient have any of the following "at risk" factors?: Nutritional status - inadequate;Nutritional status - dependent feeder;Diet - patient on thickened liquids;Saliva - thick, dry mouth Brush patient's teeth BID with toothbrush (using toothpaste with fluoride): Yes   Dysphagia Treatment Treatment focused on: Patient/family/caregiver education Family/Caregiver Educated: spouse Treatment Methods/Modalities: Skilled observation Patient observed directly with PO's: Yes Type of PO's observed: Nectar-thick liquids;Ice chips Feeding: Needs assist Liquids provided via: Cup;Teaspoon Oral Phase Signs & Symptoms: Prolonged bolus formation Pharyngeal Phase Signs & Symptoms: Suspected delayed swallow initiation;Wet vocal quality (wet vocal quality clears with cued cough) Type of cueing: Tactile Amount of cueing: Moderate   GO     Donavan Burnet, MS [facility] SLP [phone]

## 2013-02-24 ENCOUNTER — Inpatient Hospital Stay (HOSPITAL_COMMUNITY): Payer: Medicare Other

## 2013-02-24 LAB — CBC WITH DIFFERENTIAL/PLATELET
Basophils Relative: 1 % (ref 0–1)
Eosinophils Relative: 2 % (ref 0–5)
HCT: 41.8 % (ref 39.0–52.0)
Hemoglobin: 14.5 g/dL (ref 13.0–17.0)
MCH: 30.6 pg (ref 26.0–34.0)
MCHC: 34.7 g/dL (ref 30.0–36.0)
MCV: 88.2 fL (ref 78.0–100.0)
Monocytes Absolute: 1.3 10*3/uL — ABNORMAL HIGH (ref 0.1–1.0)
Monocytes Relative: 11 % (ref 3–12)
Neutro Abs: 8.7 10*3/uL — ABNORMAL HIGH (ref 1.7–7.7)
RDW: 13.9 % (ref 11.5–15.5)

## 2013-02-24 LAB — GLUCOSE, CAPILLARY
Glucose-Capillary: 107 mg/dL — ABNORMAL HIGH (ref 70–99)
Glucose-Capillary: 100 mg/dL — ABNORMAL HIGH (ref 70–99)

## 2013-02-24 LAB — BASIC METABOLIC PANEL
BUN: 40 mg/dL — ABNORMAL HIGH (ref 6–23)
CO2: 22 mEq/L (ref 19–32)
Chloride: 107 mEq/L (ref 96–112)
Creatinine, Ser: 2.9 mg/dL — ABNORMAL HIGH (ref 0.50–1.35)
GFR calc Af Amer: 23 mL/min — ABNORMAL LOW (ref >90)
Glucose, Bld: 98 mg/dL (ref 70–99)
Potassium: 3.8 mEq/L (ref 3.5–5.1)

## 2013-02-24 LAB — TROPONIN I: Troponin I: <0.3 ng/mL (ref <0.30)

## 2013-02-24 MED ORDER — ATORVASTATIN CALCIUM 20 MG PO TABS
20.0000 mg | ORAL_TABLET | Freq: Every day | ORAL | Status: DC
  Administered 2013-02-24 – 2013-02-25 (×2): 20 mg via ORAL
  Filled 2013-02-24 (×3): qty 1

## 2013-02-24 MED ORDER — AMLODIPINE BESYLATE 10 MG PO TABS: 10.0000 mg | ORAL_TABLET | ORAL | Status: DC

## 2013-02-24 MED ORDER — AMLODIPINE BESYLATE 10 MG PO TABS
10.0000 mg | ORAL_TABLET | Freq: Every day | ORAL | Status: DC
  Administered 2013-02-24 – 2013-02-26 (×3): 10 mg via ORAL
  Filled 2013-02-24 (×4): qty 1

## 2013-02-24 MED ORDER — LORAZEPAM 2 MG/ML IJ SOLN
0.5000 mg | Freq: Once | INTRAMUSCULAR | Status: AC
  Administered 2013-02-24: 0.5 mg via INTRAVENOUS
  Filled 2013-02-24: qty 1

## 2013-02-24 NOTE — Progress Notes (Signed)
Physical Therapy Treatment Patient Details Name: Kevin Escobar MRN: [id number] DOB: [birth year] Today's Date: 02/24/2013 Time: 4098-1191 PT Time Calculation (min): 34 min  PT Assessment / Plan / Recommendation  History of Present Illness Patient is a 72 yo male admitted with Rt-sided weakness, ataxia, and transient speech difficulties.  Patient with h/o recurrent TIA's. Neurological worsening 7/21.       PT Comments   Presents to PT today with hypotonicity of trunk, RLE/RUE impacting functional independence and mobility. Acute PT recommended to maximize strength and mobility for safe d/c to CIR. Daughter attempting to give patient ice chips during our session today. Educated her on his risk for aspiration and use of mouth swab and suction to wet his mouth if that is what he wanted.   Follow Up Recommendations  CIR     Does the patient have the potential to tolerate intense rehabilitation     Barriers to Discharge        Equipment Recommendations  Rolling walker with 5" wheels;Wheelchair (measurements PT);Wheelchair cushion (measurements PT)    Recommendations for Other Services    Frequency Min 4X/week   Progress towards PT Goals Progress towards PT goals: Progressing toward goals  Plan Current plan remains appropriate    Precautions / Restrictions Precautions Precautions: Fall Restrictions Weight Bearing Restrictions: No   Pertinent Vitals/Pain Denies pain, says he feels uncomfortable everywhere, once in the chair he said he felt dizzy, vitals check and WNL, BP slightly elevated (153/87), RN aware that patient feels bad and said this is how he has acted since the extension of his stroke, Difficult getting him to keep his eyes open during our session, question visual involvement??    Mobility  Bed Mobility Bed Mobility: Supine to Sit Supine to Sit: 4: Min assist;HOB elevated;With rails Sitting - Scoot to Edge of Bed: 4: Min assist (use of pad to assist) Details for Bed  Mobility Assistance: pt sat up in the direction of his stronger side (right) using rail, needed assist to bring RLE off the bed and scoot hips EOB Transfers Transfers: Sit to Stand;Stand to Sit;Stand Pivot Transfers Sit to Stand: 1: +2 Total assist;From bed Sit to Stand: Patient Percentage: 40% Stand to Sit: 1: +2 Total assist;To chair/3-in-1 Stand to Sit: Patient Percentage: 40% Stand Pivot Transfers: 1: +2 Total assist Stand Pivot Transfers: Patient Percentage: 40% Details for Transfer Assistance: facilitation for midline and anterior translation of trunk over BOS, downward facilitation through right knee to extend through his leg as well as block needed to stabilize in standing; to pivot to chair patient needing max faciliation to weight shift and attempt to rotate hips to chair  Ambulation/Gait Ambulation/Gait Assistance: Not tested (comment)      PT Goals (current goals can now be found in the care plan section)    Visit Information  Last PT Received On: 02/24/13 Assistance Needed: +2 (for transfers) History of Present Illness: Patient is a 72 yo male admitted with Rt-sided weakness, ataxia, and transient speech difficulties.  Patient with h/o recurrent TIA's.    Subjective Data      Cognition  Cognition Arousal/Alertness: Awake/alert Behavior During Therapy: WFL for tasks assessed/performed Overall Cognitive Status: Impaired/Different from baseline Area of Impairment: Attention Current Attention Level: Sustained General Comments: appears very uncomfortable throughout session, cues for attention to task, difficult getting a clear picture of what is bothering him, doesn't want to hold his eyes open or fixate on any one item, says he feels better with eyes closed  Balance  Static Sitting Balance Static Sitting - Balance Support: Right upper extremity supported Static Sitting - Comment/# of Minutes: sat EOB 10 minutes, facilitation for midline and extension through trunk,  tends to push/pull with LLE and overcorrects to the right with hypotonic trunk making it difficult to correct; on several occasions he required mod to max A to correct fall to the right (did attempt to reach with his LLE accros his body to stop himself from falling) Dynamic Sitting Balance Dynamic Sitting - Balance Support: Left upper extremity supported Dynamic Sitting - Level of Assistance: 4: Min assist Dynamic Sitting - Comments: reaching within his BOS with his LUE, loses balance very quickly requiring assist to correct  End of Session PT - End of Session Equipment Utilized During Treatment: Gait belt Activity Tolerance: Patient limited by fatigue Patient left: in chair;with family/visitor present Nurse Communication: Mobility status   GP     [facility] HELEN 02/24/2013, 12:20 PM

## 2013-02-24 NOTE — Progress Notes (Signed)
Event: Notified by RN that pt reports he feels SOB. States he is having difficulty breathing. RN reports pt RR 20-22, 02 sats 98% on 2L Lena and 90-92% on R/A w/ coarse BBS. RR RN has been paged and is currently at bedside. PCXR ordered. NP to bedside. Subjective: Pt reports he feels like he can not catch his breath. States his chest hurts but only when he takes in a deep breath. Denies dizziness, nausea or other associated c/o's. Pt admits he still smokes and is aware he has emphysema. Denies being on daily inhalers or other medications for his breathing. Is not oxygen dependent at home. Requesting something to help him sleep.  Objective: Kevin Escobar is a 72 y.o. male with past medical history of recurrent TIA, CKD, renal artery stenosis s/p stent placement, AAA repair in 1995 and Hypertension who was admitted for CVA on 02/20/13 after presenting to ED with the c/o difficulty speaking and imbalance that started the previous evening. Neurology is following. At bedside pt noted to be somewhat restless but in NAD. He is awake, alert and oriented x 3. Skin w/d. BBS diminished but otherwise CTA. Somewhat hypertensive (165/107), remaining VSS. 12-lead EKG obtained and reveals NSR w/o acute changes. PCXR findings c/w chronic emphysematous and bronchitic changes but no acute pulmonary process.  Assessment/Plan: 1. SOB: (Subjective) No overt resp distress. May have anxiety component as pt somewhat restless and c/o need for sleep. PE considered, though pt is not hypoxic or tachycardic.  ACS less likely given findings however will obtain Troponin x 1 as pt w/ multiple risks factors. Will give Ativan as ordered and reassess for improvement.  Reassessment: Spoke with RN approx 1 hour after pt rec'd IV Ativan. Reports pt states feeling much better and no longer feels SOB. He reports he has been resting well. Will continue to monitor closely.   Leanne Chang, NP-C [facility] Pager [phone]

## 2013-02-24 NOTE — Progress Notes (Signed)
Stroke Team Progress Note  HISTORY Kevin Escobar is a 72 y.o. male with a past medical history significant for HTN, TIA, CKD, renal artery stenosis s/p stent placement, AAA repair in 1995, prostate and bladder cancer, admitted to [facility] with new onset right hemiparesis and 2 episodes of transient language impairment.  His wife tells me that the day before admission 02/19/2013 he was not walking good and fell because he was not able to use his right leg properly. Then he awoke 02/20/2013 and started having gradual progressing generalized weakness. At around 1:00 he had another episode of difficulty speaking and more localized weakness involving the right hemibody and he agreed to come to the ED on 02/20/2013. A CT brain upon arrival to ED showed no acute intracranial abnormality. On aspirin 325 mg daily and simvastatin 40 mg daily. Neuro was consulted 02/21/2013. Patient was not a TPA candidate secondary to delay in arrival.  SUBJECTIVE Family at bedside along with RN and tech. Daughter presented at the end of visit, multiple questions anwered. Dr. Pearlean Brownie discussed diagnosis, prognosis,  treatment options and plan of care.  OBJECTIVE Most recent Vital Signs: Filed Vitals:   02/23/13 2252 02/24/13 0131 02/24/13 0515 02/24/13 1057  BP: 165/107 183/88 152/98 164/81  Pulse: 95 91 94 88  Temp: 97.2 F (36.2 C) 97.6 F (36.4 C) 97.6 F (36.4 C) 96.1 F (35.6 C)  TempSrc: Oral Oral Oral Oral  Resp: 20 20 20 20   Height:      Weight:      SpO2: 98% 97% 96% 95%   CBG (last 3)   Recent Labs  02/23/13 1645 02/23/13 2141 02/24/13 0630  GLUCAP 94 98 107*    IV Fluid Intake:     MEDICATIONS  . amLODipine  10 mg Oral Daily  . atorvastatin  20 mg Oral q1800  . cloNIDine  0.1 mg Oral Q1200  . clopidogrel  75 mg Oral Q breakfast  . enoxaparin (LOVENOX) injection  30 mg Subcutaneous Q24H  . metoprolol  5 mg Intravenous Q8H  . sodium chloride  3 mL Intravenous Q12H   PRN:  acetaminophen,  acetaminophen, hydrALAZINE, LORazepam, ondansetron, RESOURCE THICKENUP CLEAR, senna-docusate, sodium chloride, traZODone  Diet:  Dysphagia I with nectar thick liquids Activity:  OOB with assistance,  Up with assistance DVT Prophylaxis:  Lovenox 30 mg sq daily   CLINICALLY SIGNIFICANT STUDIES Basic Metabolic Panel:   Recent Labs Lab 02/22/13 0540 02/22/13 0800 02/23/13 0555 02/24/13 0525  NA 138  --  143 144  K 3.1*  --  4.0 3.8  CL 102  --  107 107  CO2 23  --  24 22  GLUCOSE 144*  --  116* 98  BUN 37*  --  38* 40*  CREATININE 2.71*  --  3.00* 2.90*  CALCIUM 10.2  --  10.2 10.1  MG  --  2.2  --   --    Liver Function Tests:   Recent Labs Lab 02/20/13 1631  AST 10  ALT 10  ALKPHOS 62  BILITOT 0.2*  PROT 6.5  ALBUMIN 3.2*   CBC:   Recent Labs Lab 02/20/13 1631  02/23/13 1410 02/24/13 0525  WBC 7.5  --  15.4* 11.9*  NEUTROABS 4.2  --   --  8.7*  HGB 12.8*  < > 13.4 14.5  HCT 37.6*  < > 40.0 41.8  MCV 87.4  --  88.3 88.2  PLT 172  --  207 197  < > = values  in this interval not displayed. Coagulation:   Recent Labs Lab 02/20/13 1631  LABPROT 12.6  INR 0.96   Cardiac Enzymes:   Recent Labs Lab 02/24/13 0025  TROPONINI <0.30   Urinalysis:   Recent Labs Lab 02/20/13 1804  COLORURINE YELLOW  LABSPEC 1.010  PHURINE 6.0  GLUCOSEU NEGATIVE  HGBUR NEGATIVE  BILIRUBINUR NEGATIVE  KETONESUR NEGATIVE  PROTEINUR 100*  UROBILINOGEN 0.2  NITRITE NEGATIVE  LEUKOCYTESUR NEGATIVE   Lipid Panel    Component Value Date/Time   CHOL 176 02/22/2013 0540   TRIG 218* 02/22/2013 0540   HDL 31* 02/22/2013 0540   CHOLHDL 5.7 02/22/2013 0540   VLDL 44* 02/22/2013 0540   LDLCALC 101* 02/22/2013 0540   HgbA1C  Lab Results  Component Value Date   HGBA1C 5.6 02/21/2013    Urine Drug Screen:     Component Value Date/Time   LABOPIA NONE DETECTED 02/20/2013 1803   COCAINSCRNUR NONE DETECTED 02/20/2013 1803   LABBENZ NONE DETECTED 02/20/2013 1803   AMPHETMU NONE  DETECTED 02/20/2013 1803   THCU NONE DETECTED 02/20/2013 1803   LABBARB NONE DETECTED 02/20/2013 1803    Alcohol Level: No results found for this basename: ETH,  in the last 168 hours  CT of the brain   02/22/2013    1.  Stable age related cerebral atrophy, ventriculomegaly and periventricular white matter disease. 2.  Remote lacunar type basal ganglia infarcts and remote left occipital cortical infarct. 3.  Acute left basal ganglia lacunar type infarct without complicating features. 4.  No acute hemispheric infarction or intracranial hemorrhage.   02/20/2013    No acute intracranial abnormalities.  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  Chronic microvascular ischemic change  MRI of the brain 02/21/2013   1.  Acute lacunar infarct left posterior limb internal capsule.  No mass effect or hemorrhage. 2.  Underlying advanced chronic small and medium size vessel ischemia, with some progression since 2012.   MRA of the brain  02/21/2013  Stable and negative intracranial MRA.     2D Echocardiogram  EF 60-65% with no source of embolus.   Carotid Doppler  There is 0-39% ICA stenosis. Vertebral artery flow is antegrade.  CXR  02/21/2013   No active cardiopulmonary abnormalities.    EKG  sinus tachycardia.   Therapy Recommendations CIR  Physical Exam   Frail elderly male not in distress.Awake alert. Afebrile. Head is nontraumatic. Neck is supple without bruit. Hearing is normal. Cardiac exam no murmur or gallop. Lungs are clear to auscultation. Distal pulses are well felt. Neurological Exam ;  Awake alert oriented x 3 normal speech and language. Mild right lower face asymmetry. Tongue midline. No drift. Mild diminished fine finger movements on right. Orbits left  Over right upper extremity. RUE 2/5 and RLE 1/5 weakness.Severe right grip weak..Decrease right hemibody sensation . Impaired right sided coordination.   ASSESSMENT Mr. Kevin Escobar is a 72 y.o. male presenting with right hemiparesis and 2  episodes of transient language impairment . Imaging confirms a left PLIC infarct (this is a lacunar infarct). Infarct felt to be thrombotic secondary to small vessel disease.  On aspirin 325 mg orally every day prior to admission. Now on clopidogrel 75 mg orally every day for secondary stroke prevention.  Patient with resultant right hemiparesis and aphasia that has worsened during hospitalization. The frequency of progression of neurological deficits after onset is most common in patients with lacunar infarcts (37%).  Today, the patient has stabilized without progressive worsening. Ok to  be OOB and continue therapy. Workup is completed.  Hypertension Hyperlipidemia, LDL 101, on zocor 10 PTA, now on zocor 40, goal LDL < 100 (< 70 for diabetics) Hx TIA PVD - s/p renal stent and AAA repair CKD Depression  Leukocytosis - improving 15.4->11.9 ?anxiety during the night, resolved with ativan  Hospital day # 4  TREATMENT/PLAN  Continue clopidogrel 75 mg orally every day for secondary stroke prevention.  OOB. continue therapy  D/c telemetry - not a source of the stroke  Agree with inpatient rehabilitation at discharge No further stroke workup indicated. Patient has a 10-15% risk of having another stroke over the next year, the highest risk is within 2 weeks of the most recent stroke/TIA (risk of having a stroke following a stroke or TIA is the same). Ongoing risk factor control by Primary Care Physician Stroke Service will sign off. Please call should any needs arise. Follow up with Dr. Pearlean Brownie, Stroke Clinic, in 2 months.  Dr. Pearlean Brownie discussed diagnosis, prognosis,  treatment options and plan of care with  Patient and his daughter and rest of family at the bedside.   Annie Main, MSN, RN, ANVP-BC, ANP-BC, [facility] Pager: [phone] 02/24/2013 11:02 AM  I have personally obtained a history, examined the patient, evaluated imaging results, and formulated the  assessment and plan of care. I agree with the above.  Delia Heady, MD

## 2013-02-24 NOTE — Progress Notes (Signed)
Rehab admissions - Evaluated for possible admission.  Appropriate for acute inpatient rehab admission.  Noted patient now on Dys 1 with nectar thick liquids.  I will follow up in am for possible admit to acute inpatient rehab.  Call me for questions.  #[phone]

## 2013-02-24 NOTE — Progress Notes (Addendum)
PHARMACIST - PHYSICIAN COMMUNICATION DR:   Truett Mainland.  CONCERNING:  Amlodipine reordered from Medication Reconciliation; pt also receiving Zocor 40mg . Home dose of Amlodipine 10mg  is described as:  Takes 1 tablet every day at noon and a 2nd tablet 2-3 times week (random days) in the evening.  The max dose of Zocor with Amlodipine is 20mg .  DESCRIPTION: Amlodipine 10mg  daily has been entered.  Please adjust dosing as needed.  Lipitor has been substituted for Zocor per [employer] guidelines.  Please consider sending pt home on Lipitor.  Marisue Humble, PharmD Clinical Pharmacist [facility]

## 2013-02-24 NOTE — Progress Notes (Signed)
Pt reported difficulty breathing. Pt having labored respirations at a rate of 20. Oxygen saturation 98% on room air. 2L O2 nasal cannula was placed on pt (O2 remained at 98-100%).  Md and Rapid Response notified.  Orders given for Chest X-ray, EKG, and Troponin. Orders were completed. Pt given Ativan for anxiety. Shortness of breath began to resolve. Pt stable. Rn will continue to monitor and make Md aware of any change.

## 2013-02-24 NOTE — Progress Notes (Signed)
[facility] PROGRESS NOTE  MALACHY COLEMAN ZOX:[id number] DOB: 25-Feb-1941 DOA: 02/20/2013 PCP: No primary provider on file.  Assessment/Plan: #1. Acute CVA Patient with worsening sxs of r facial weakness, dysarthria and worsening RLE weakness. Patient was on aspirin prior to admission. Head CT is negative. MRI c/w CVA. 2-D echo with EF 60-65% with NWMA and no source of emboli. FLP with LDL 101. Carotid Dopplers preliminary result with no significant ICA stenosis. Continue Plavix per neurology recommendations. PT/OT/ST. Risk factor modification. Neurology following and appreciate input and recommendations.  #2 hypertension Permissive hypertension.  Continue clonidine. IV Lopressor. Resume norvasc   #3 chronic kidney disease Stable. Creatinine currently at 3. Baseline creatinine approximately 2.5-3. Will discontinue Lasix and follow.  #4 peripheral vascular disease status post renal stent and AAA repair Stable. Patient has been changed from aspirin to Plavix. Follow.  #5 depression Stable.  #6 hyperlipidemia Continue Zocor.  #7 Hypokalemia secondary to diureses. Replete.   #8 prophylaxis SCDs for DVT prophylaxis.  #9SOB -resolved with ativan   Code Status: Full Family Communication: Updated patient Disposition Plan: CIR when bed available   Consultants:  Neurology: Dr Cyril Mourning 02/21/13  Procedures:  MRI pending  CT head 02/20/13  CXR 02/21/13  2-D echo 02/21/2013  Carotid Dopplers 02/21/2013  V/Q scan 02/22/13  Antibiotics:  None  HPI/Subjective: says family will be here soon No voiced complaints  Objective: Filed Vitals:   02/23/13 2136 02/23/13 2252 02/24/13 0131 02/24/13 0515  BP: 171/109 165/107 183/88 152/98  Pulse: 90 95 91 94  Temp: 98.1 F (36.7 C) 97.2 F (36.2 C) 97.6 F (36.4 C) 97.6 F (36.4 C)  TempSrc: Oral Oral Oral Oral  Resp: 20 20 20 20   Height:      Weight:      SpO2: 98% 98% 97% 96%    Intake/Output Summary (Last 24  hours) at 02/24/13 1036 Last data filed at 02/24/13 0515  Gross per 24 hour  Intake    240 ml  Output    600 ml  Net   -360 ml   Filed Weights   02/20/13 1604 02/20/13 2054  Weight: 68.04 kg (150 lb) 67.7 kg (149 lb 4 oz)    Exam:   General:  nad- slurred sppech  Cardiovascular: rrr  Respiratory: Left basilar crackles. No wheezing  Abdomen: Soft/NT/ND/+BS  Neuro: RLE weakness. RUE weakness. Dysarthria, R facial weakness.  Data Reviewed: Basic Metabolic Panel:  Recent Labs Lab 02/20/13 1631 02/20/13 1654 02/21/13 0829 02/22/13 0540 02/22/13 0800 02/23/13 0555 02/24/13 0525  NA 137 142 139 138  --  143 144  K 3.2* 3.2* 3.3* 3.1*  --  4.0 3.8  CL 102 107 104 102  --  107 107  CO2 21  --  21 23  --  24 22  GLUCOSE 122* 118* 87 144*  --  116* 98  BUN 40* 39* 38* 37*  --  38* 40*  CREATININE 2.96* 2.70* 2.88* 2.71*  --  3.00* 2.90*  CALCIUM 9.5  --  9.7 10.2  --  10.2 10.1  MG  --   --   --   --  2.2  --   --    Liver Function Tests:  Recent Labs Lab 02/20/13 1631  AST 10  ALT 10  ALKPHOS 62  BILITOT 0.2*  PROT 6.5  ALBUMIN 3.2*   No results found for this basename: LIPASE, AMYLASE,  in the last 168 hours No results found for this basename: AMMONIA,  in the last 168 hours CBC:  Recent Labs Lab 02/20/13 1631 02/20/13 1654 02/23/13 1410 02/24/13 0525  WBC 7.5  --  15.4* 11.9*  NEUTROABS 4.2  --   --  8.7*  HGB 12.8* 12.9* 13.4 14.5  HCT 37.6* 38.0* 40.0 41.8  MCV 87.4  --  88.3 88.2  PLT 172  --  207 197   Cardiac Enzymes:  Recent Labs Lab 02/24/13 0025  TROPONINI <0.30   BNP (last 3 results) No results found for this basename: PROBNP,  in the last 8760 hours CBG:  Recent Labs Lab 02/23/13 0706 02/23/13 1148 02/23/13 1645 02/23/13 2141 02/24/13 0630  GLUCAP 112* 115* 94 98 107*    No results found for this or any previous visit (from the past 240 hour(s)).   Studies: Nm Pulmonary Perfusion  2013-03-13   *RADIOLOGY REPORT*   Clinical Data:  NUCLEAR MEDICINE - PERFUSION LUNG SCAN  Technique:   Perfusion images were obtained in multiple projections after intravenous injection of Tc-69m MAA.  Ventilation scans were not performed.  Radiopharmaceuticals:  3.0 mCi Tc-34m MAA.  Comparison:  Chest x-ray 02/21/2013  Findings: The perfusion lung scan demonstrates a few patchy perfusion defects.  No segmental or subsegmental defects are suggested to suggest pulmonary emboli.  IMPRESSION: Low probability perfusion lung scan for pulmonary embolism.   Original Report Authenticated By: Rudie Meyer, M.D.   Dg Chest Port 1 View  02/23/2013   *RADIOLOGY REPORT*  Clinical Data: Congestion and shortness of breath.  Possible aspiration.  PORTABLE CHEST - 1 VIEW  Comparison: 02/21/2013  Findings: Hyperinflation suggesting emphysema.  Central peribronchial thickening and interstitial changes likely due to chronic bronchitis.  Linear fibrosis or atelectasis in the right lung base.  Stable appearance since previous study.  No developing airspace disease or consolidation.  No blunting of costophrenic angles.  No pneumothorax.  Tortuous aorta.  IMPRESSION: Stable chronic emphysematous and bronchitic changes in the chest. No evidence of active pulmonary disease.   Original Report Authenticated By: Burman Nieves, M.D.   Dg Swallowing Func-speech Pathology  02/23/2013   Chales Abrahams, CCC-SLP     02/23/2013 10:26 AM Objective Swallowing Evaluation: Modified Barium Swallowing Study   Patient Details  Name: RONDELL PARDON MRN: [id number] Date of Birth: [birth year]  Today's Date: 02/23/2013 Time: 4098-1191 SLP Time Calculation (min): 12 min  Past Medical History:  Past Medical History  Diagnosis Date  . Cancer of bladder 2010  . Cancer of prostate 1994  . Depression   . Chronic kidney disease   . Hypertension   . TIA (transient ischemic attack) 2008  . Peripheral vascular disease   . PVD (peripheral vascular disease) 02/21/2013   Past Surgical History:  Past  Surgical History  Procedure Laterality Date  . Abdominal aortic aneurysm repair  1995  . Prostate surgery  1994  . Colon surgery  2010   HPI:  ISHMAEL BERKOVICH is an 72 y.o. male with a past medical history  significant for HTN, TIA, CKD, renal artery stenosis s/p stent  placement, AAA repair in 1995, prostate and bladder cancer,  admitted to [facility] with new onset right hemiparesis and 2 episodes of  transient language impairment..  Pt  was seen by Speech  pathologist on Sunday with recommendations for dys2/thin,  overtnight he had episode of vomiting and "gurgling" after intake  of water, also with increased weakness, facial droop on right and  slurred speech.  MRI completed that showed left basal ganglia  cva.  BSE repeated yesterday with diet of puree/thin initiated,   MBS indicated due to concern for dysphagia even PTA.       Assessment / Plan / Recommendation Clinical Impression  Clinical impression: Limited assessment (tsp of nectar liquid  given only) secondary to pt demonstrating AMS immediately after  swallow of first bolus.  Pt had been fully alert, eyes open,  after first swallow he closed his eyes, was lethargic, and SOB;  he complained of "feeling woozy".  MBS was ceased at that time,  SLP phoned floor to inform nurse while xray techs transported pt  back to room.   Pt demonstrated mild delay in oral transiting and  pharyngeal swallow initiation at approx 1 second with nectar to  vallecular space.  Also mild residuals present at tongue base  without pt awareness.  Testing was dc'd at that time due to  medical concerns.   Pt was observed by SLP to be overtly coughing during breakfast  intake today most notably with thin liquids- suspect aspiration  due to overt coughing, swallow delay and residuals noted on MBS.   Rec pt diet be modified to mitigate aspiration risk puree/nectar.   Pt would benefit from repeat MBS within the next few days.     Treatment Recommendation  Therapy as outlined in treatment plan  below    Diet Recommendation Dysphagia 1 (Puree);Nectar-thick liquid;Ice  chips PRN after oral care   Liquid Administration via: Cup;No straw Medication Administration: Whole meds with puree Supervision: Full supervision/cueing for compensatory  strategies;Staff feed patient;Trained caregiver to feed patient Compensations: Slow rate;Small sips/bites;Multiple dry swallows  after each bite/sip;Hard cough after swallow;Clear throat  intermittently, check for oral pocketing on right Postural Changes and/or Swallow Maneuvers: Seated upright 90  degrees    Other  Recommendations Oral Care Recommendations: Oral care BID Other Recommendations: Clarify dietary restrictions   Follow Up Recommendations       Frequency and Duration min 2x/week  1 week   Pertinent Vitals/Pain Afebrile, decreased, congested cough    SLP Swallow Goals Patient will utilize recommended strategies during swallow to  increase swallowing safety with: Minimal assistance Swallow Study Goal #2 - Progress: Progressing toward goal   General Date of Onset: 02/20/13 HPI: JARMAN LITTON is an 72 y.o. male with a past medical  history significant for HTN, TIA, CKD, renal artery stenosis s/p  stent placement, AAA repair in 1995, prostate and bladder cancer,  admitted to [facility] with new onset right hemiparesis and 2 episodes of  transient language impairment..  Pt  was seen by Speech  pathologist on Sunday with recommendations for dys2/thin,  overtnight he had episode of vomiting and "gurgling" after intake  of water, also with increased weakness, facial droop on right and  slurred speech.  MRI completed that showed left basal ganglia  cva.  BSE repeated yesterday with diet of puree/thin initiated,   MBS indicated due to concern for dysphagia even PTA.   Type of Study: Modified Barium Swallowing Study Reason for Referral: Objectively evaluate swallowing function Diet Prior to this Study: Dysphagia 1 (puree);Thin liquids Respiratory Status: Room air History of  Recent Intubation: No Behavior/Cognition: Alert;Cooperative;Distractible;Pleasant  mood;Requires cueing Oral Cavity - Dentition: Adequate natural dentition Oral Motor / Sensory Function: Impaired - see Bedside swallow  eval Self-Feeding Abilities: Needs assist Patient Positioning: Upright in chair Baseline Vocal Quality: Clear Volitional Cough: Congested;Wet (pt has a "smoker's cough" per  spouse) Volitional Swallow: Able to elicit Anatomy: Within functional limits Pharyngeal Secretions: Not  observed secondary MBS    Reason for Referral Objectively evaluate swallowing function   Oral Phase Oral Preparation/Oral Phase Oral Phase: Impaired Oral - Nectar Oral - Nectar Teaspoon: Reduced posterior propulsion;Weak lingual  manipulation   Pharyngeal Phase Pharyngeal Phase Pharyngeal Phase: Impaired Pharyngeal - Nectar Pharyngeal - Nectar Teaspoon: Delayed swallow  initiation;Premature spillage to valleculae;Pharyngeal residue -  valleculae  Cervical Esophageal Phase    GO    Cervical Esophageal Phase Cervical Esophageal Phase: WFL (for barium provided)        Donavan Burnet, MS [facility] SLP [phone]     Scheduled Meds: . cloNIDine  0.1 mg Oral Q1200  . clopidogrel  75 mg Oral Q breakfast  . enoxaparin (LOVENOX) injection  30 mg Subcutaneous Q24H  . metoprolol  5 mg Intravenous Q8H  . simvastatin  40 mg Oral q1800  . sodium chloride  3 mL Intravenous Q12H   Continuous Infusions:   Principal Problem:   CVA (cerebral vascular accident) Active Problems:   Chronic kidney disease   Hypertension   Peripheral vascular disease   Depression   PVD (peripheral vascular disease)   Hypokalemia    Time spent:  35 mins    VANN, JESSICA  [facility] Pager [phone] If 7PM-7AM, please contact night-coverage at [email or web address], password [facility] 02/24/2013, 10:36 AM  LOS: 4 days

## 2013-02-24 NOTE — Progress Notes (Signed)
SLP Cancellation Note  Patient Details Name: Kevin Escobar MRN: [id number] DOB: [birth year]   Cancelled treatment:   Attempted to see pt for speech/language evaluation and diet tolerance assessment, however pt was too lethargic to participate in therapy. RN reports that he had recently received Ativan due to family c/o agitation, and that he would likely not be able to participate today. Will continue to follow.  Maxcine Ham 02/24/2013, 3:48 PM  Maxcine Ham, M.A. CCC-SLP

## 2013-02-24 NOTE — Progress Notes (Signed)
PT Cancellation Note  Patient Details Name: Kevin Escobar MRN: [id number] DOB: March 18, 1941   Cancelled Treatment:    Reason Eval/Treat Not Completed: Patient not medically ready. Patient currently on bed rest. Please update activity orders for [place] to proceed with OOB/mobility.    [facility] HELEN 02/24/2013, 7:34 AM Pager: [phone]

## 2013-02-25 ENCOUNTER — Inpatient Hospital Stay (HOSPITAL_COMMUNITY): Payer: Medicare Other

## 2013-02-25 LAB — GLUCOSE, CAPILLARY
Glucose-Capillary: 132 mg/dL — ABNORMAL HIGH (ref 70–99)
Glucose-Capillary: 121 mg/dL — ABNORMAL HIGH (ref 70–99)

## 2013-02-25 MED ORDER — CLONIDINE HCL 0.1 MG PO TABS
0.1000 mg | ORAL_TABLET | Freq: Two times a day (BID) | ORAL | Status: DC
  Administered 2013-02-25 – 2013-02-26 (×2): 0.1 mg via ORAL
  Filled 2013-02-25 (×4): qty 1

## 2013-02-25 MED ORDER — METOPROLOL TARTRATE 25 MG PO TABS
25.0000 mg | ORAL_TABLET | Freq: Two times a day (BID) | ORAL | Status: DC
  Administered 2013-02-25 – 2013-02-26 (×3): 25 mg via ORAL
  Filled 2013-02-25 (×5): qty 1

## 2013-02-25 NOTE — Progress Notes (Addendum)
[facility] PROGRESS NOTE  Kevin Escobar AVW:[id number] DOB: Apr 02, 1941 DOA: 02/20/2013 PCP: No primary provider on file.  Assessment/Plan: #1. Acute CVA Patient with worsening sxs of r facial weakness, dysarthria and worsening RLE weakness. Patient was on aspirin prior to admission. Head CT is negative. MRI c/w CVA. 2-D echo with EF 60-65% with NWMA and no source of emboli. FLP with LDL 101. Carotid Dopplers preliminary result with no significant ICA stenosis. Continue Plavix per neurology recommendations. PT/OT/ST. Risk factor modification. Neurology following and appreciate input and recommendations.  #2 hypertension  Continue clonidine. Add PO lopressor, on norvasc   #3 chronic kidney disease Stable. Creatinine currently at 3. Baseline creatinine approximately 2.5-3. Will discontinue Lasix and follow.  #4 peripheral vascular disease status post renal stent and AAA repair Stable. Patient has been changed from aspirin to Plavix. Follow.  #5 depression Stable.  #6 hyperlipidemia Continue Zocor.  #7 Hypokalemia secondary to diureses. Replete.   #8 prophylaxis SCDs for DVT prophylaxis.  #9SOB -resolved with ativan   Code Status: Full Family Communication: Updated patient Disposition Plan: CIR when bed available   Consultants:  Neurology: Dr Cyril Mourning 02/21/13  Procedures:  MRI pending  CT head 02/20/13  CXR 02/21/13  2-D echo 02/21/2013  Carotid Dopplers 02/21/2013  V/Q scan 02/22/13  Antibiotics:  None  HPI/Subjective: patient frustrated and wanting to go home No SOB, no CP Family giving patient anything he wants (thin liquids/ice)  Objective: Filed Vitals:   02/25/13 0117 02/25/13 0612 02/25/13 1003 02/25/13 1033  BP: 143/81 178/89 163/93 175/86  Pulse: 91 105 108 109  Temp: 98.1 F (36.7 C) 98.1 F (36.7 C)  98.6 F (37 C)  TempSrc: Oral Oral  Oral  Resp: 16 18 18 18   Height:      Weight:      SpO2: 96% 94% 96% 95%    Intake/Output  Summary (Last 24 hours) at 02/25/13 1253 Last data filed at 02/25/13 0700  Gross per 24 hour  Intake    240 ml  Output    700 ml  Net   -460 ml   Filed Weights   02/20/13 1604 02/20/13 2054  Weight: 68.04 kg (150 lb) 67.7 kg (149 lb 4 oz)    Exam:   General:  nad- slurred speech  Cardiovascular: rrr  Respiratory: Left basilar crackles. No wheezing  Abdomen: Soft/NT/ND/+BS  Neuro: RLE weakness. RUE weakness. Dysarthria, R facial weakness.  Data Reviewed: Basic Metabolic Panel:  Recent Labs Lab 02/20/13 1631 02/20/13 1654 02/21/13 0829 02/22/13 0540 02/22/13 0800 02/23/13 0555 02/24/13 0525  NA 137 142 139 138  --  143 144  K 3.2* 3.2* 3.3* 3.1*  --  4.0 3.8  CL 102 107 104 102  --  107 107  CO2 21  --  21 23  --  24 22  GLUCOSE 122* 118* 87 144*  --  116* 98  BUN 40* 39* 38* 37*  --  38* 40*  CREATININE 2.96* 2.70* 2.88* 2.71*  --  3.00* 2.90*  CALCIUM 9.5  --  9.7 10.2  --  10.2 10.1  MG  --   --   --   --  2.2  --   --    Liver Function Tests:  Recent Labs Lab 02/20/13 1631  AST 10  ALT 10  ALKPHOS 62  BILITOT 0.2*  PROT 6.5  ALBUMIN 3.2*   No results found for this basename: LIPASE, AMYLASE,  in the last 168 hours No results  found for this basename: AMMONIA,  in the last 168 hours CBC:  Recent Labs Lab 02/20/13 1631 02/20/13 1654 02/23/13 1410 02/24/13 0525  WBC 7.5  --  15.4* 11.9*  NEUTROABS 4.2  --   --  8.7*  HGB 12.8* 12.9* 13.4 14.5  HCT 37.6* 38.0* 40.0 41.8  MCV 87.4  --  88.3 88.2  PLT 172  --  207 197   Cardiac Enzymes:  Recent Labs Lab 02/24/13 0025  TROPONINI <0.30   BNP (last 3 results) No results found for this basename: PROBNP,  in the last 8760 hours CBG:  Recent Labs Lab 02/24/13 1203 02/24/13 1620 02/24/13 2128 02/25/13 0710 02/25/13 1146  GLUCAP 116* 117* 100* 100* 132*    No results found for this or any previous visit (from the past 240 hour(s)).   Studies: Dg Chest Port 1 View  02/23/2013    *RADIOLOGY REPORT*  Clinical Data: Congestion and shortness of breath.  Possible aspiration.  PORTABLE CHEST - 1 VIEW  Comparison: 02/21/2013  Findings: Hyperinflation suggesting emphysema.  Central peribronchial thickening and interstitial changes likely due to chronic bronchitis.  Linear fibrosis or atelectasis in the right lung base.  Stable appearance since previous study.  No developing airspace disease or consolidation.  No blunting of costophrenic angles.  No pneumothorax.  Tortuous aorta.  IMPRESSION: Stable chronic emphysematous and bronchitic changes in the chest. No evidence of active pulmonary disease.   Original Report Authenticated By: Burman Nieves, M.D.   Dg Swallowing Func-speech Pathology  02/25/2013   Kevin Escobar, CCC-SLP     02/25/2013 10:50 AM Objective Swallowing Evaluation: Modified Barium Swallowing Study   Patient Details  Name: Kevin Escobar MRN: [id number] Date of Birth: [birth year]  Today's Date: 02/25/2013 Time: 0910-0931 SLP Time Calculation (min): 21 min  Past Medical History:  Past Medical History  Diagnosis Date  . Cancer of bladder 2010  . Cancer of prostate 1994  . Depression   . Chronic kidney disease   . Hypertension   . TIA (transient ischemic attack) 2008  . Peripheral vascular disease   . PVD (peripheral vascular disease) 02/21/2013   Past Surgical History:  Past Surgical History  Procedure Laterality Date  . Abdominal aortic aneurysm repair  1995  . Prostate surgery  1994  . Colon surgery  2010   HPI:  Kevin Escobar is an 72 y.o. male with a past medical history  significant for HTN, TIA, CKD, renal artery stenosis s/p stent  placement, AAA repair in 1995, prostate and bladder cancer,  admitted to [facility] with new onset right hemiparesis and 2 episodes of  transient language impairment..  Pt  was seen by Speech  pathologist on Sunday with recommendations for dys2/thin,  overtnight he had episode of vomiting and "gurgling" after intake  of water, also with increased  weakness, facial droop on right and  slurred speech.  MRI completed that showed left basal ganglia  cva.  BSE repeated yesterday with diet of puree/thin initiated,   MBS indicated to determine readiness for dietary advancement and  allow instrumental evaluation of swallow function.       Assessment / Plan / Recommendation Clinical Impression  Dysphagia Diagnosis: Moderate oral phase dysphagia;Moderate  pharyngeal phase dysphagia Clinical impression: Pt presents with moderate sensorimotor  oropharyngeal dysphagia from his cva that is exacerbated by  fatigue factor.  Weakness with oral manipulation results in  delayed transiting, decreased oral propulsion and stasis of  liquids right oral sulci *mixed with  secretions without  awareness.  Cues to "swallow fast and hard" were helpful to  expedite swallow but were fatiguing to pt as well.  Pharyngeal  deficits characterized by inconsisent delay in swallow initiation  and weakness resulting in AUDIBLE aspiration of thin liquids x2  during testing which he was unable to fully clear.  Chin tuck  posture, head turn right, and limited amount to tsp did not  prevent aspiration.  Pt did not aspirate or penetrate with nectar  but stasis present across consistencies due to weakness.  Cued  dry swallows helpful to decrease stasis but did not fully  eliminate them, but contribute to fatigue.    Suspect pt is having some aspiration of secretions based on MBS  results and decreased oral control.   Pt's level of dysphagia and  fatigue may impair his ability to consume adequate nutrition  without aspiration.  He is not appropriate for a dietary  advancement at this time.    Rec continue current diet with very strict precautions to  mitigate aspiration.  SLP used teach back with video to assure pt  understood testing results and recommendations as pt's following  of compensation strategies during po has been limited.  SLP found  cup of thin water on tray table in room today, water  protocol   initiation on CIR may impove pt QOL and compliance.       Treatment Recommendation    CIR   Diet Recommendation Dysphagia 1 (Puree);Nectar-thick liquid  (single ice chips)   Liquid Administration via: Cup;Straw Medication Administration: Crushed with puree (crush if large and  not contraindicated) Supervision: Full supervision/cueing for compensatory  strategies;Patient able to self feed Compensations: Slow rate;Small sips/bites;Check for  pocketing;Follow solids with liquid;Effortful swallow;Multiple  dry swallows after each bite/sip (frequent rest breaks) Postural Changes and/or Swallow Maneuvers: Seated upright 90  degrees;Upright 30-60 min after meal    Other  Recommendations Oral Care Recommendations: Oral care BID Other Recommendations: Clarify dietary restrictions;Have oral  suction available   Follow Up Recommendations  Inpatient Rehab    Frequency and Duration min 2x/week  1 week   Pertinent Vitals/Pain Afebrile, congested cough    SLP Swallow Goals Patient will utilize recommended strategies during swallow to  increase swallowing safety with: Moderate assistance Swallow Study Goal #2 - Progress: Progressing toward goal   General HPI: JENTRY WARNELL is an 72 y.o. male with a past  medical history significant for HTN, TIA, CKD, renal artery  stenosis s/p stent placement, AAA repair in 1995, prostate and  bladder cancer, admitted to [facility] with new onset right hemiparesis  and 2 episodes of transient language impairment..  Pt  was seen  by Speech pathologist on Sunday with recommendations for  dys2/thin, overtnight he had episode of vomiting and "gurgling"  after intake of water, also with increased weakness, facial droop  on right and slurred speech.  MRI completed that showed left  basal ganglia cva.  BSE repeated yesterday with diet of  puree/thin initiated,  MBS indicated due to concern for dysphagia  even PTA.   Type of Study: Modified Barium Swallowing Study Reason for Referral: Objectively  evaluate swallowing function Previous Swallow Assessment: BSE and MBS, MBS ceased in middle of  testing due to concern for medical change  Diet Prior to this Study: Dysphagia 1 (puree);Nectar-thick  liquids (single ice chips) Temperature Spikes Noted: No Respiratory Status: Room air History of Recent Intubation: No Behavior/Cognition: Alert;Cooperative;Pleasant  mood;Impulsive;Requires cueing;Decreased sustained attention Oral Cavity -  Dentition: Adequate natural dentition Oral Motor / Sensory Function: Impaired - see Bedside swallow  eval Self-Feeding Abilities: Needs assist Patient Positioning: Upright in chair Baseline Vocal Quality: Breathy;Low vocal intensity Volitional Cough: Congested Volitional Swallow:  (did not elicit consistently) Anatomy: Within functional limits Pharyngeal Secretions: Standing secretions in (comment)  (oropharynx, removed with swallowing and oral suction)    Reason for Referral Objectively evaluate swallowing function   Oral Phase Oral Preparation/Oral Phase Oral Phase: Impaired Oral - Nectar Oral - Nectar Teaspoon: Reduced posterior  propulsion;Lingual/palatal residue;Delayed oral transit;Weak  lingual manipulation Oral - Nectar Cup: Reduced posterior propulsion;Lingual/palatal  residue;Lingual pumping;Weak lingual manipulation;Delayed oral  transit;Right anterior bolus loss Oral - Nectar Straw: Reduced posterior propulsion;Weak lingual  manipulation;Lingual/palatal residue;Delayed oral transit;Right  anterior bolus loss Oral - Thin Oral - Thin Teaspoon: Piecemeal swallowing;Lingual/palatal  residue;Reduced posterior propulsion;Delayed oral transit;Weak  lingual manipulation;Right anterior bolus loss;Holding of bolus Oral - Thin Cup: Right anterior bolus loss;Reduced posterior  propulsion;Holding of bolus Oral - Thin Straw: Right anterior bolus loss;Holding of  bolus;Lingual/palatal residue;Weak lingual manipulation;Delayed  oral transit Oral - Solids Oral - Puree: Piecemeal  swallowing;Reduced posterior  propulsion;Lingual/palatal residue;Weak lingual  manipulation;Holding of bolus Oral - Regular: Impaired mastication;Reduced posterior  propulsion;Lingual/palatal residue;Weak lingual  manipulation;Delayed oral transit Oral Phase - Comment Oral Phase - Comment: inconsistent delay in oral transfer, pt  required verbal cues to transit quickly, anterior loss right side  without awareness   Pharyngeal Phase Pharyngeal Phase Pharyngeal Phase: Impaired Pharyngeal - Nectar Pharyngeal - Nectar Teaspoon: Reduced tongue base  retraction;Reduced pharyngeal peristalsis;Delayed swallow  initiation;Premature spillage to valleculae;Premature spillage to  pyriform sinuses Pharyngeal - Nectar Cup: Delayed swallow initiation;Premature  spillage to valleculae Pharyngeal - Nectar Straw: Delayed swallow initiation;Premature  spillage to pyriform sinuses;Premature spillage to valleculae Pharyngeal - Thin Pharyngeal - Thin Teaspoon: Delayed swallow initiation;Premature  spillage to pyriform sinuses;Penetration/Aspiration during  swallow Penetration/Aspiration details (thin teaspoon): Material enters  airway, passes BELOW cords and not ejected out despite cough  attempt by patient;Material enters airway, CONTACTS cords then  ejected out Pharyngeal - Thin Cup: Premature spillage to valleculae;Delayed  swallow initiation;Reduced pharyngeal peristalsis;Premature  spillage to pyriform sinuses;Reduced laryngeal elevation;Reduced  airway/laryngeal closure;Reduced tongue base  retraction;Penetration/Aspiration during swallow Penetration/Aspiration details (thin cup): Material enters  airway, passes BELOW cords and not ejected out despite cough  attempt by patient Pharyngeal - Thin Straw: Premature spillage to  valleculae;Premature spillage to pyriform sinuses;Compensatory  strategies attempted (Comment);Penetration/Aspiration during  swallow;Delayed swallow initiation Penetration/Aspiration details (thin straw):  Material enters  airway, passes BELOW cords and not ejected out despite cough  attempt by patient Pharyngeal - Solids Pharyngeal - Puree: Premature spillage to valleculae;Delayed  swallow initiation;Reduced pharyngeal peristalsis;Reduced  laryngeal elevation;Reduced airway/laryngeal closure;Reduced  tongue base retraction;Pharyngeal residue - valleculae;Pharyngeal  residue - pyriform sinuses Pharyngeal - Regular: Premature spillage to valleculae;Delayed  swallow initiation;Reduced tongue base retraction;Reduced  airway/laryngeal closure;Reduced laryngeal elevation;Reduced  pharyngeal peristalsis;Reduced epiglottic inversion;Reduced  anterior laryngeal mobility;Pharyngeal residue - valleculae Pharyngeal Phase - Comment Pharyngeal Comment: head turn right, chin tuck did not  consistently help pt, fatigue factor high as pt becomes quickly  short of breath with intake  Cervical Esophageal Phase    GO    Cervical Esophageal Phase Cervical Esophageal Phase: Impaired Cervical Esophageal Phase - Comment Cervical Esophageal Comment: pt appears with pyriform sinus  stasis of liquids that mixed with secretions, cued dry swallows  helped to decrease stasis but are fatiguing for pt        Donavan Burnet, MS [facility]  SLP [phone]     Scheduled Meds: . amLODipine  10 mg Oral Daily  . atorvastatin  20 mg Oral q1800  . cloNIDine  0.1 mg Oral Q1200  . clopidogrel  75 mg Oral Q breakfast  . enoxaparin (LOVENOX) injection  30 mg Subcutaneous Q24H  . metoprolol  5 mg Intravenous Q8H  . sodium chloride  3 mL Intravenous Q12H   Continuous Infusions:   Principal Problem:   CVA (cerebral vascular accident) Active Problems:   Chronic kidney disease   Hypertension   Peripheral vascular disease   Depression   PVD (peripheral vascular disease)   Hypokalemia    Time spent:  35 mins    Harless Molinari  [facility] Pager [phone] If 7PM-7AM, please contact night-coverage at [email or web address], password [facility] 02/25/2013,  12:53 PM  LOS: 5 days

## 2013-02-25 NOTE — Progress Notes (Signed)
Occupational Therapy Treatment Patient Details Name: Kevin Escobar MRN: [id number] DOB: 12/05/1940 Today's Date: 02/25/2013 Time: 4098-1191 OT Time Calculation (min): 34 min  OT Assessment / Plan / Recommendation  History of present illness Patient is a 72 yo male admitted with Rt-sided weakness, ataxia, and transient speech difficulties.  Patient with h/o recurrent TIA's.   Clinical Impression Pt progressing towards goals. Pt more alert and able to respond to questions and interact with therapists. Pt still with expressive aphasia. Pt worked on sitting balance and attention to right side. Pt needing verbal and tactile cues to activate core muscles for sitting. Pt able to wash BUE arms and hands with mod assist and wash face using LUE min guard. Pt continues to benefit from skilled OT in the acute setting before going to CIR.      Follow Up Recommendations  CIR             Frequency Min 3X/week   Progress towards OT Goals Progress towards OT goals: Progressing toward goals  Plan Discharge plan remains appropriate    Precautions / Restrictions Precautions Precautions: Fall Restrictions Weight Bearing Restrictions: No   Pertinent Vitals/Pain Pt reported no pain during the session. Pt sitting comfortably in chair with chair alarm at end of session.    ADL  Grooming: Wash/dry hands;Wash/dry face;Moderate assistance Where Assessed - Grooming: Supported sitting Transfers/Ambulation Related to ADLs: sit <> stand + total      OT Goals(current goals can now be found in the care plan section) Acute Rehab OT Goals OT Goal Formulation: With patient/family Time For Goal Achievement: 03/09/13 Potential to Achieve Goals: Good ADL Goals Additional ADL Goal #1: Pt will sit EOB maintaining balance fro 10 min prior to ADL Additional ADL Goal #2: Pt will position affected arm prior to mobility with LUE to decrease risk of subluxation  Visit Information  Last OT Received On:  02/25/13 Assistance Needed: +2 PT/OT Co-Evaluation/Treatment: Yes History of Present Illness: Patient is a 72 yo male admitted with Rt-sided weakness, ataxia, and transient speech difficulties.  Patient with h/o recurrent TIA's.          Cognition  Cognition Arousal/Alertness: Awake/alert Behavior During Therapy: WFL for tasks assessed/performed Overall Cognitive Status: Impaired/Different from baseline Area of Impairment: Attention Current Attention Level: Sustained    Mobility  Bed Mobility Bed Mobility: Supine to Sit;Sitting - Scoot to Edge of Bed Supine to Sit: HOB elevated;With rails;3: Mod assist Sitting - Scoot to Edge of Bed: 3: Mod assist Details for Bed Mobility Assistance: Pt educated on using his LLE to help scoot his RLE off the bed during bed mobility. Pt needed help with sitting balance during bed mobility.  Transfers Transfers: Sit to Stand;Stand to Sit Sit to Stand: 1: +2 Total assist;From bed Sit to Stand: Patient Percentage: 40% Stand to Sit: 1: +2 Total assist;To chair/3-in-1 Stand to Sit: Patient Percentage: 40% Details for Transfer Assistance: facilitation for midline and anterior translation of trunk over BOS, downward facilitation through right knee to extend through his leg as well as block needed to stabilize in standing; to pivot to chair patient needing max faciliation to weight shift and attempt to rotate hips to chair        Balance Balance Balance Assessed: Yes Static Sitting Balance Static Sitting - Balance Support: Left upper extremity supported (pushes to the right with LUE) Static Sitting - Level of Assistance: 4: Min assist Dynamic Sitting Balance Dynamic Sitting - Balance Support: Left upper extremity supported Dynamic Sitting -  Level of Assistance: 4: Min assist Dynamic Sitting - Balance Activities: Lateral lean/weight shifting;Forward lean/weight shifting;Reaching for objects Dynamic Sitting - Comments: Patient attempted dynamic sitting  activities. Able to shift weight to left to reach object. Pt needed verbal cues to find object with eyes before reaching out to touch. Pt with mod vc's to keep head up and posture during leans.   End of Session OT - End of Session Equipment Utilized During Treatment: Gait belt Activity Tolerance: Patient limited by fatigue Patient left: in chair;with call bell/phone within reach;with chair alarm set;with family/visitor present Nurse Communication: Mobility status;Precautions (Pt ready to eat lunch)  GO     Sherryl Manges 02/25/2013, 1:40 PM

## 2013-02-25 NOTE — Progress Notes (Signed)
Pt participated better in txs this afternoon.  And, he is more receptive to CIR admit this afternoon than he was this morning.  Will plan on admit to CIR tomorrow, if he continues medically stable-he is in agreement w/ this.  His family is also in agreement w/ an admit to CIR.  Genie will f/up w/ him tomorrow. [phone]

## 2013-02-25 NOTE — Procedures (Addendum)
Objective Swallowing Evaluation: Modified Barium Swallowing Study  Patient Details  Name: Kevin Escobar MRN: [id number] Date of Birth: 1941-04-09  Today's Date: 02/25/2013 Time: 0910-0931 SLP Time Calculation (min): 21 min  Past Medical History:  Past Medical History  Diagnosis Date  . Cancer of bladder 2010  . Cancer of prostate 1994  . Depression   . Chronic kidney disease   . Hypertension   . TIA (transient ischemic attack) 2008  . Peripheral vascular disease   . PVD (peripheral vascular disease) 02/21/2013   Past Surgical History:  Past Surgical History  Procedure Laterality Date  . Abdominal aortic aneurysm repair  1995  . Prostate surgery  1994  . Colon surgery  2010   HPI:  Kevin Escobar is an 72 y.o. male with a past medical history significant for HTN, TIA, CKD, renal artery stenosis s/p stent placement, AAA repair in 1995, prostate and bladder cancer, admitted to [facility] with new onset right hemiparesis and 2 episodes of transient language impairment..  Pt  was seen by Speech pathologist on Sunday with recommendations for dys2/thin, overtnight he had episode of vomiting and "gurgling" after intake of water, also with increased weakness, facial droop on right and slurred speech.  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  BSE repeated yesterday with diet of puree/thin initiated,  MBS indicated to determine readiness for dietary advancement and allow instrumental evaluation of swallow function.       Assessment / Plan / Recommendation Clinical Impression  Dysphagia Diagnosis: Moderate oral phase dysphagia;Moderate pharyngeal phase dysphagia Clinical impression: Pt presents with moderate sensorimotor oropharyngeal dysphagia from his cva that is exacerbated by fatigue factor.  Weakness with oral manipulation results in delayed transiting, decreased oral propulsion and stasis of liquids right oral sulci *mixed with secretions without awareness.  Cues to "swallow fast and  hard" were helpful to expedite swallow but were fatiguing to pt as well.  Pharyngeal deficits characterized by inconsisent delay in swallow initiation and weakness resulting in AUDIBLE aspiration of thin liquids x2 during testing which he was unable to fully clear.  Chin tuck posture, head turn right, and limited amount to tsp did not prevent aspiration.  Pt did not aspirate or penetrate with nectar but stasis present across consistencies due to weakness.  Cued dry swallows helpful to decrease stasis but did not fully eliminate them, but contribute to fatigue.    Suspect pt is having some aspiration of secretions based on MBS results and decreased oral control.   Pt's level of dysphagia and fatigue may impair his ability to consume adequate nutrition without aspiration.  He is not appropriate for a dietary advancement at this time.    Rec continue current diet with very strict precautions to mitigate aspiration.  SLP used teach back with video to assure pt understood testing results and recommendations as pt's following of compensation strategies during po has been limited.  SLP found cup of thin water on tray table in room today, water protocol  initiation on CIR may impove pt QOL and compliance.       Treatment Recommendation    CIR   Diet Recommendation Dysphagia 1 (Puree);Nectar-thick liquid (single ice chips)   Liquid Administration via: Cup;Straw Medication Administration: Crushed with puree (crush if large and not contraindicated) Supervision: Full supervision/cueing for compensatory strategies;Patient able to self feed Compensations: Slow rate;Small sips/bites;Check for pocketing;Follow solids with liquid;Effortful swallow;Multiple dry swallows after each bite/sip (frequent rest breaks) Postural Changes and/or Swallow Maneuvers: Seated upright 90  degrees;Upright 30-60 min after meal    Other  Recommendations Oral Care Recommendations: Oral care BID Other Recommendations: Clarify dietary  restrictions;Have oral suction available   Follow Up Recommendations  Inpatient Rehab    Frequency and Duration min 2x/week  1 week   Pertinent Vitals/Pain Afebrile, congested cough    SLP Swallow Goals Patient will utilize recommended strategies during swallow to increase swallowing safety with: Moderate assistance Swallow Study Goal #2 - Progress: Progressing toward goal   General HPI: Kevin Escobar is an 72 y.o. male with a past medical history significant for HTN, TIA, CKD, renal artery stenosis s/p stent placement, AAA repair in 1995, prostate and bladder cancer, admitted to [facility] with new onset right hemiparesis and 2 episodes of transient language impairment..  Pt  was seen by Speech pathologist on Sunday with recommendations for dys2/thin, overtnight he had episode of vomiting and "gurgling" after intake of water, also with increased weakness, facial droop on right and slurred speech.  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  BSE repeated yesterday with diet of puree/thin initiated,  MBS indicated due to concern for dysphagia even PTA.   Type of Study: Modified Barium Swallowing Study Reason for Referral: Objectively evaluate swallowing function Previous Swallow Assessment: BSE and MBS, MBS ceased in middle of testing due to concern for medical change  Diet Prior to this Study: Dysphagia 1 (puree);Nectar-thick liquids (single ice chips) Temperature Spikes Noted: No Respiratory Status: Room air History of Recent Intubation: No Behavior/Cognition: Alert;Cooperative;Pleasant mood;Impulsive;Requires cueing;Decreased sustained attention Oral Cavity - Dentition: Adequate natural dentition Oral Motor / Sensory Function: Impaired - see Bedside swallow eval Self-Feeding Abilities: Needs assist Patient Positioning: Upright in chair Baseline Vocal Quality: Breathy;Low vocal intensity Volitional Cough: Congested Volitional Swallow:  (did not elicit consistently) Anatomy: Within  functional limits Pharyngeal Secretions: Standing secretions in (comment) (oropharynx, removed with swallowing and oral suction)    Reason for Referral Objectively evaluate swallowing function   Oral Phase Oral Preparation/Oral Phase Oral Phase: Impaired Oral - Nectar Oral - Nectar Teaspoon: Reduced posterior propulsion;Lingual/palatal residue;Delayed oral transit;Weak lingual manipulation Oral - Nectar Cup: Reduced posterior propulsion;Lingual/palatal residue;Lingual pumping;Weak lingual manipulation;Delayed oral transit;Right anterior bolus loss Oral - Nectar Straw: Reduced posterior propulsion;Weak lingual manipulation;Lingual/palatal residue;Delayed oral transit;Right anterior bolus loss Oral - Thin Oral - Thin Teaspoon: Piecemeal swallowing;Lingual/palatal residue;Reduced posterior propulsion;Delayed oral transit;Weak lingual manipulation;Right anterior bolus loss;Holding of bolus Oral - Thin Cup: Right anterior bolus loss;Reduced posterior propulsion;Holding of bolus Oral - Thin Straw: Right anterior bolus loss;Holding of bolus;Lingual/palatal residue;Weak lingual manipulation;Delayed oral transit Oral - Solids Oral - Puree: Piecemeal swallowing;Reduced posterior propulsion;Lingual/palatal residue;Weak lingual manipulation;Holding of bolus Oral - Regular: Impaired mastication;Reduced posterior propulsion;Lingual/palatal residue;Weak lingual manipulation;Delayed oral transit Oral Phase - Comment Oral Phase - Comment: inconsistent delay in oral transfer, pt required verbal cues to transit quickly, anterior loss right side without awareness   Pharyngeal Phase Pharyngeal Phase Pharyngeal Phase: Impaired Pharyngeal - Nectar Pharyngeal - Nectar Teaspoon: Reduced tongue base retraction;Reduced pharyngeal peristalsis;Delayed swallow initiation;Premature spillage to valleculae;Premature spillage to pyriform sinuses Pharyngeal - Nectar Cup: Delayed swallow initiation;Premature spillage to  valleculae Pharyngeal - Nectar Straw: Delayed swallow initiation;Premature spillage to pyriform sinuses;Premature spillage to valleculae Pharyngeal - Thin Pharyngeal - Thin Teaspoon: Delayed swallow initiation;Premature spillage to pyriform sinuses;Penetration/Aspiration during swallow Penetration/Aspiration details (thin teaspoon): Material enters airway, passes BELOW cords and not ejected out despite cough attempt by patient;Material enters airway, CONTACTS cords then ejected out Pharyngeal - Thin Cup: Premature spillage to valleculae;Delayed swallow initiation;Reduced pharyngeal  peristalsis;Premature spillage to pyriform sinuses;Reduced laryngeal elevation;Reduced airway/laryngeal closure;Reduced tongue base retraction;Penetration/Aspiration during swallow Penetration/Aspiration details (thin cup): Material enters airway, passes BELOW cords and not ejected out despite cough attempt by patient Pharyngeal - Thin Straw: Premature spillage to valleculae;Premature spillage to pyriform sinuses;Compensatory strategies attempted (Comment);Penetration/Aspiration during swallow;Delayed swallow initiation Penetration/Aspiration details (thin straw): Material enters airway, passes BELOW cords and not ejected out despite cough attempt by patient Pharyngeal - Solids Pharyngeal - Puree: Premature spillage to valleculae;Delayed swallow initiation;Reduced pharyngeal peristalsis;Reduced laryngeal elevation;Reduced airway/laryngeal closure;Reduced tongue base retraction;Pharyngeal residue - valleculae;Pharyngeal residue - pyriform sinuses Pharyngeal - Regular: Premature spillage to valleculae;Delayed swallow initiation;Reduced tongue base retraction;Reduced airway/laryngeal closure;Reduced laryngeal elevation;Reduced pharyngeal peristalsis;Reduced epiglottic inversion;Reduced anterior laryngeal mobility;Pharyngeal residue - valleculae Pharyngeal Phase - Comment Pharyngeal Comment: head turn right, chin tuck did not  consistently help pt, fatigue factor high as pt becomes quickly short of breath with intake  Cervical Esophageal Phase    GO    Cervical Esophageal Phase Cervical Esophageal Phase: Impaired Cervical Esophageal Phase - Comment Cervical Esophageal Comment: pt appears with pyriform sinus stasis of liquids that mixed with secretions, cued dry swallows helped to decrease stasis but are fatiguing for pt        Donavan Burnet, MS [facility] SLP [phone]

## 2013-02-25 NOTE — Progress Notes (Signed)
I agree with the following treatment note after reviewing documentation.   [place], Amar[place]wu [place] Brynn   OTR/L Pager: [phone] Office: [phone] .   

## 2013-02-25 NOTE — Progress Notes (Addendum)
Speech Language Pathology Dysphagia Treatment Patient Details Name: Kevin Escobar MRN: [id number] DOB: 1941/04/23 Today's Date: 02/25/2013 Time: 1011-1021 SLP Time Calculation (min): 10 min  Assessment / Plan / Recommendation Clinical Impression  Treatment focused on educating pt and his brother/sister-in-law to results of MBS, indications for modified diet and compensatory strategies.  SLP provided detailed instructions in writing to pt, which he read outloud and stated clinical reasoning back to SLP.  SLP posted sign in pt's room within his view for meals.    FULL supervision indicated for maximal airway protection and to aid in generalization of compensatory strategies.      Diet Recommendation  Initiate / Change Diet: Dysphagia 1 (puree);Nectar-thick liquid (single ice chips only)    SLP Plan Continue with current plan of care   Pertinent Vitals/Pain Afebrile, congested cough   Swallowing Goals  SLP Swallowing Goals Patient will utilize recommended strategies during swallow to increase swallowing safety with: Moderate assistance Swallow Study Goal #2 - Progress: Progressing toward goal  General Temperature Spikes Noted: No Respiratory Status: Room air Behavior/Cognition: Alert;Cooperative;Pleasant mood;Requires cueing;Decreased sustained attention Oral Cavity - Dentition: Adequate natural dentition Patient Positioning: Upright in bed  Oral Cavity - Oral Hygiene Does patient have any of the following "at risk" factors?: Diet - patient on thickened liquids;Other - dysphagia;Nutritional status - inadequate;Oxygen therapy - cannula, mask, simple oxygen devices;Nutritional status - dependent feeder;Saliva - thick, dry mouth Brush patient's teeth BID with toothbrush (using toothpaste with fluoride): Yes   Dysphagia Treatment Treatment focused on: Patient/family/caregiver education Family/Caregiver Educated: son Treatment Methods/Modalities: Skilled observation Patient observed  directly with PO's: No Reason PO's not observed: Other (comment) (education session only) Feeding: Needs assist Type of cueing: Verbal Amount of cueing: Moderate   GO    Donavan Burnet, MS [facility] SLP [phone]

## 2013-02-25 NOTE — PMR Pre-admission (Signed)
PMR Admission Coordinator Pre-Admission Assessment  Patient: Kevin Escobar is an 72 y.o., male MRN: [id number] DOB: 1941-01-15 Height: 5\' 11"  (180.3 cm) Weight: 67.7 kg (149 lb 4 oz)              Insurance Information HMO:     PPO:       PCP:       IPA:       80/20: yes     OTHER:   PRIMARY: Medicare "A" only      Policy#: [id number]      Subscriber: pt Employer: retired Benefits:       Name: Armed forces technical officer. Date: 08-06-03 "A" only     Deduct: $1216.00/year      Out of Pocket Max: 0      Life Max: 0 CIR: 100%      SNF: preauth w/ [employer]-see below Outpatient: see [employer] below for further        SECONDARY: [employer]      Policy#: [id number]      Subscriber: pt CM Name: not needed for CIR d/t Medicare primary for hospital-services out of hospital call [phone] for precert    Employer: retired Benefits:  Phone #: [phone]     Name: Georgiann Mohs. Date: 08-05-05     Deduct: $250.00 [met]      Out of Pocket Max: $4000.00 [$1027.25 met]      Life Max: 0 CIR: no preauth needed d/t [facility] primary-[employer] does cover [facility] deductible & copay      SNF: [employer] covers semipvt room up to 90 days/year per medical necessity. Outpatient:  + HH 125 visits per person per year-PT, OT, ST combined   Home Health:   90 RN/LPN visits/person/year per medical necessity-no coverage for [facility] aide      DME: 90% after deduct     Co-Pay: 10% till OOP met  [per insurance card [facility] network]    Emergency Contact Information Contact Information   Name Relation Home Work Mobile   [place] Spouse [phone]  [phone]     Current Medical History  Patient Admitting Diagnosis:  Right PLIC infarct  History of Present Illness:   72 y.o. male with a past medical history significant for HTN, TIA, CKD, renal artery stenosis s/p stent placement, AAA repair in 1995, prostate and bladder cancer; admitted on 02/21/13 with progressive difficulty walking due to right sided weakness and fall as well and  2 episodes of transient language impairment for >24 hours PTA. MRI/MRA brain with acute left posterior limb infarct and no IC stenosis. 2D echo with EF 60-65% and No wall abnormality. Carotid dopplers with mild soft plaque L-ICA origin. Patient with neurological worsening on 07/21 with vomiting/?aspiration event.   Total: 9    Past Medical History  Past Medical History  Diagnosis Date  . Cancer of bladder 2010  . Cancer of prostate 1994  . Depression   . Chronic kidney disease   . Hypertension   . TIA (transient ischemic attack) 2008  . Peripheral vascular disease   . PVD (peripheral vascular disease) 02/21/2013    Family History  family history is not on file.  Prior Rehab/Hospitalizations: no   Current Medications  Current facility-administered medications:acetaminophen (TYLENOL) suppository 650 mg, 650 mg, Rectal, Q4H PRN, Lynden Oxford, MD;  acetaminophen (TYLENOL) tablet 650 mg, 650 mg, Oral, Q4H PRN, Lynden Oxford, MD, 650 mg at 02/24/13 2219;  amLODipine (NORVASC) tablet 10 mg, 10 mg, Oral, Daily, Kendra P Hiatt, RPH, 10 mg  at 02/25/13 1106;  atorvastatin (LIPITOR) tablet 20 mg, 20 mg, Oral, q1800, Renaee Munda, RPH, 20 mg at 02/24/13 1732 cloNIDine (CATAPRES) tablet 0.1 mg, 0.1 mg, Oral, BID, Jessica U Vann, DO;  clopidogrel (PLAVIX) tablet 75 mg, 75 mg, Oral, Q breakfast, Rodolph Bong, MD, 75 mg at 02/25/13 1106;  enoxaparin (LOVENOX) injection 30 mg, 30 mg, Subcutaneous, Q24H, Layne Benton, NP, 30 mg at 02/25/13 1107;  hydrALAZINE (APRESOLINE) injection 10 mg, 10 mg, Intravenous, Q6H PRN, Rolan Lipa, NP, 10 mg at 02/25/13 1209 LORazepam (ATIVAN) injection 0.5 mg, 0.5 mg, Intravenous, TID PRN, Rodolph Bong, MD, 0.5 mg at 02/24/13 1251;  metoprolol tartrate (LOPRESSOR) tablet 25 mg, 25 mg, Oral, BID, Jessica U Vann, DO, 25 mg at 02/25/13 1505;  ondansetron (ZOFRAN) injection 4 mg, 4 mg, Intravenous, Q6H PRN, Rolan Lipa, NP, 4 mg at 02/22/13 2159;   RESOURCE THICKENUP CLEAR, , Oral, PRN, Rodolph Bong, MD senna-docusate (Senokot-S) tablet 1 tablet, 1 tablet, Oral, QHS PRN, Lynden Oxford, MD;  sodium chloride 0.9 % injection 3 mL, 3 mL, Intravenous, Q12H, Lynden Oxford, MD, 3 mL at 02/25/13 1107;  sodium chloride 0.9 % injection 3 mL, 3 mL, Intravenous, PRN, Lynden Oxford, MD;  traZODone (DESYREL) tablet 50-100 mg, 50-100 mg, Oral, QHS PRN, Lynden Oxford, MD, 100 mg at 02/24/13 2220  Patients Current Diet: Dysphagia D1 nectar  Precautions / Restrictions Precautions Precautions: Fall Restrictions Weight Bearing Restrictions: No   Prior Activity Level  wife reports decreased activity tolerance-would start off the day w/ energy, but, this would wane-this started about 2 months ago.   Home Assistive Devices / Equipment Home Assistive Devices/Equipment: None Home Equipment: Cane - quad;Cane - single point  Prior Functional Level Prior Function Level of Independence: Independent Comments: Pt was driving, independent in all ADL and mobility  Current Functional Level Cognition  Overall Cognitive Status: Impaired/Different from baseline Current Attention Level: Sustained;Selective Orientation Level: Oriented X4 Safety/Judgement: Decreased awareness of safety General Comments: more engaged this session, following commands consistently, impulsive with decreased safety awareness today trying to get out of bed without assist, RLE hanging out of the bed when we walked in with RLE stuck in the bed Attention: Sustained Sustained Attention: Impaired Memory: Impaired Awareness: Impaired Awareness Impairment: Intellectual impairment (decreased awareness to dysphagia) Problem Solving: Impaired (did not call for assist to have HOB raised, repositioned) Problem Solving Impairment: Verbal basic (pt desires to go home at this time, does not agree w/CIR) Executive Function: Reasoning Reasoning: Impaired Reasoning Impairment: Verbal basic;Functional  basic Behaviors: Restless;Poor frustration tolerance;Lability Safety/Judgment: Impaired    Extremity Assessment (includes Sensation/Coordination)  Upper Extremity Assessment: RUE deficits/detail RUE Deficits / Details: Strength 2+/5 generally in hand and 2-/5 in elbow RUE Coordination: decreased fine motor;decreased gross motor  Lower Extremity Assessment: Defer to PT evaluation RLE Deficits / Details: Pt's leg was tight and did not want to bend RLE Coordination: decreased gross motor    ADLs  Eating/Feeding: Maximal assistance Where Assessed - Eating/Feeding: Bed level Grooming: Wash/dry hands;Wash/dry face;Moderate assistance Where Assessed - Grooming: Supported sitting Upper Body Bathing: Moderate assistance Where Assessed - Upper Body Bathing: Supported sitting Lower Body Bathing: +1 Total assistance Where Assessed - Lower Body Bathing: Supported sitting Upper Body Dressing: Maximal assistance Where Assessed - Upper Body Dressing: Supported sitting Lower Body Dressing: +1 Total assistance Where Assessed - Lower Body Dressing: Supported sitting Toilet Transfer: +2 Total assistance Toilet Transfer Method: Surveyor, minerals: Set designer -  Clothing Manipulation and Hygiene: +1 Total assistance Where Assessed - Toileting Clothing Manipulation and Hygiene: Standing Tub/Shower Transfer: +2 Total assistance Tub/Shower Transfer Method: Not assessed Transfers/Ambulation Related to ADLs: sit <> stand + total  ADL Comments: Pt sat EOB for approximately 15 min supported. Pt could hold himself up, but fatigued quickly. Pt able to wash face supported sitting with LUE. Pt with max vc's to keep his head up.    Mobility  Bed Mobility: Supine to Sit Rolling Right: 4: Min assist;With rail Right Sidelying to Sit: 3: Mod assist;HOB flat;With rails Supine to Sit: 3: Mod assist;With rails Sitting - Scoot to Edge of Bed: 3: Mod assist Sit to Supine: 3: Mod  assist;HOB flat    Transfers  Transfers: Sit to Stand;Stand to Sit;Stand Pivot Transfers Sit to Stand: 1: +2 Total assist;From bed Sit to Stand: Patient Percentage: 40% Stand to Sit: 1: +2 Total assist;To chair/3-in-1 Stand to Sit: Patient Percentage: 40% Stand Pivot Transfers: 1: +2 Total assist Stand Pivot Transfers: Patient Percentage: 40%    Ambulation / Gait / Stairs / Wheelchair Mobility  Ambulation/Gait Ambulation/Gait Assistance: Not tested (comment)    Posture / Balance Static Sitting Balance Static Sitting - Balance Support: Left upper extremity supported (pushes to the right with LUE) Static Sitting - Level of Assistance: 4: Min assist Static Sitting - Comment/# of Minutes: sat EOB 10 minutes, facilitation for midline and extension through trunk, tends to push/pull with LLE and overcorrects to the right with hypotonic trunk making it difficult to correct; on several occasions he required mod to max A to correct fall to the right (did attempt to reach with his LLE accros his body to stop himself from falling) Dynamic Sitting Balance Dynamic Sitting - Balance Support: Left upper extremity supported Dynamic Sitting - Level of Assistance: 4: Min assist Dynamic Sitting - Balance Activities: Lateral lean/weight shifting;Forward lean/weight shifting;Reaching for objects Dynamic Sitting - Comments: Patient attempted dynamic sitting activities. Able to shift weight to left to reach object. Pt needed verbal cues to find object with eyes before reaching out to touch. Pt with mod vc's to keep head up and posture during leans.    Special needs/care consideration  Skin: no problems noted                             Bowel mgmt: LBM 02/20/13 Bladder mgmt: condom cath      Previous Home Environment Living Arrangements: Spouse/significant other Available Help at Discharge: Family;Available 24 hours/day Type of Home: House Home Layout: One level Home Access: Stairs to enter Entrance  Stairs-Rails: Right Entrance Stairs-Number of Steps: 4 Home Care Services: No  Discharge Living Setting Plans for Discharge Living Setting: Patient's home Type of Home at Discharge: House Discharge Home Layout: Multi-level (there are 3 steps from hall to living area) Alternate Level Stairs-Rails: Right Alternate Level Stairs-Number of Steps: 3 Discharge Home Access: Stairs to enter Entrance Stairs-Rails: Left Entrance Stairs-Number of Steps: 3 to landing & then 1 into the house Discharge Bathroom Shower/Tub: Tub/shower unit Does the patient have any problems obtaining your medications?: No  Social/Family/Support Systems Patient Roles: Spouse;Parent Contact Information: [phone] Anticipated Caregiver: wife, Marina Goodell, Nicholos Johns, lives in [place] & is only temporarily here-her cell# [phone]] Anticipated Caregiver's Contact Information: wife's cell# [phone] Ability/Limitations of Caregiver: light Min A Caregiver Availability: 24/7 Discharge Plan Discussed with Primary Caregiver: Yes Is Caregiver In Agreement with Plan?: Yes Does Caregiver/Family  have Issues with Lodging/Transportation while Pt is in Rehab?: No    Goals/Additional Needs Patient/Family Goal for Rehab: S-Min A Expected length of stay: @ 3 weeks Pt/Family Agrees to Admission and willing to participate: Yes Program Orientation Provided & Reviewed with Pt/Caregiver Including Roles  & Responsibilities: Yes   Decrease burden of Care through IP rehab admission: Specialzed equipment needs, Diet advancement, Decrease number of caregivers and Patient/family education   Possible need for SNF placement upon discharge: cannot rule out   Patient Condition: This patient's medical and functional status has changed since the consult dated:02/23/13 in which the Rehabilitation Physician determined and documented that the patient's condition is appropriate for intensive rehabilitative care in an inpatient  rehabilitation facility. See "History of Present Illness" (above) for medical update. Functional changes are: Currently requiring total assist +2 40% for transfers.  Patient's medical and functional status update has been discussed with the Rehabilitation physician and patient remains appropriate for inpatient rehabilitation. Will admit to inpatient rehab today.  Preadmission Screen Completed By:  Brock Ra, 02/25/2013 4:27 PM ______________________________________________________________________   Discussed status with Dr. Riley Kill on 02/26/13 at [phone] and received telephone approval for admission today.  Admission Coordinator:  Brock Ra, time0927/Date07/25/14

## 2013-02-25 NOTE — Progress Notes (Signed)
Physical Therapy Treatment Patient Details Name: Kevin Escobar MRN: [id number] DOB: 01-Apr-1941 Today's Date: 02/25/2013 Time: 4098-1191 PT Time Calculation (min): 34 min  PT Assessment / Plan / Recommendation  History of Present Illness Patient is a 72 yo male admitted with Rt-sided weakness, ataxia, and transient speech difficulties.  Patient with h/o recurrent TIA's.      PT Comments   More engaged in therapy today however still fatigues quickly. HR elevated to 127 during activity.   Follow Up Recommendations  CIR     Does the patient have the potential to tolerate intense rehabilitation     Barriers to Discharge        Equipment Recommendations  Rolling walker with 5" wheels;Wheelchair (measurements PT);Wheelchair cushion (measurements PT)    Recommendations for Other Services Rehab consult  Frequency Min 4X/week   Progress towards PT Goals Progress towards PT goals: Progressing toward goals  Plan Current plan remains appropriate    Precautions / Restrictions Precautions Precautions: Fall Restrictions Weight Bearing Restrictions: No   Pertinent Vitals/Pain 127 HR during activity    Mobility  Bed Mobility Bed Mobility: Supine to Sit Supine to Sit: 3: Mod assist;With rails Sitting - Scoot to Edge of Bed: 3: Mod assist Details for Bed Mobility Assistance: Pt educated on using his LLE to help scoot his RLE off the bed during bed mobility.facilitation for hips and trunk engagement when pulling himself up EOB Transfers Sit to Stand: 1: +2 Total assist;From bed Sit to Stand: Patient Percentage: 40% Stand to Sit: 1: +2 Total assist;To chair/3-in-1 Stand to Sit: Patient Percentage: 40% Details for Transfer Assistance: facilitation for midline and anterior translation of trunk over BOS, block needed to stabilize right knee and stabilize hip in standing; facilitation to hips/upper trunk to encourage trunk extension; to pivot to chair patient needing max faciliation to weight  shift and attempt to rotate hips to chair       PT Goals (current goals can now be found in the care plan section)    Visit Information  Last PT Received On: 02/25/13 Assistance Needed: +2 History of Present Illness: Patient is a 72 yo male admitted with Rt-sided weakness, ataxia, and transient speech difficulties.  Patient with h/o recurrent TIA's.    Subjective Data      Cognition  Cognition Arousal/Alertness: Awake/alert Behavior During Therapy: WFL for tasks assessed/performed Overall Cognitive Status: Impaired/Different from baseline Area of Impairment: Attention;Safety/judgement Current Attention Level: Sustained;Selective Safety/Judgement: Decreased awareness of safety General Comments: more engaged this session, following commands consistently, impulsive with decreased safety awareness today trying to get out of bed without assist, RLE hanging out of the bed when we walked in with RLE stuck in the bed    Balance  Balance Balance Assessed: Yes Static Sitting Balance Static Sitting - Balance Support: Left upper extremity supported (pushes to the right with LUE) Static Sitting - Level of Assistance: 4: Min assist Dynamic Sitting Balance Dynamic Sitting - Balance Support: Left upper extremity supported Dynamic Sitting - Level of Assistance: 4: Min assist Dynamic Sitting - Balance Activities: Lateral lean/weight shifting;Forward lean/weight shifting;Reaching for objects Dynamic Sitting - Comments: Patient attempted dynamic sitting activities. Able to shift weight to left to reach object. Pt needed verbal cues to find object with eyes before reaching out to touch. Pt with mod vc's to keep head up and posture during leans.  End of Session PT - End of Session Equipment Utilized During Treatment: Gait belt Activity Tolerance: Patient limited by fatigue Patient left: in  chair;with family/visitor present Nurse Communication: Mobility status   GP     [facility]  HELEN 02/25/2013, 2:14 PM

## 2013-02-25 NOTE — Evaluation (Signed)
Speech Language Pathology Evaluation Patient Details Name: Kevin Escobar MRN: [id number] DOB: August 26, 1940 Today's Date: 02/25/2013 Time: 0812-0840 SLP Time Calculation (min): 28 min  Problem List:  Patient Active Problem List   Diagnosis Date Noted  . Hypokalemia 02/22/2013  . Depression 02/21/2013  . PVD (peripheral vascular disease) 02/21/2013  . CVA (cerebral vascular accident) 02/20/2013  . Chronic kidney disease   . Hypertension   . TIA (transient ischemic attack)   . Peripheral vascular disease    Past Medical History:  Past Medical History  Diagnosis Date  . Cancer of bladder 2010  . Cancer of prostate 1994  . Depression   . Chronic kidney disease   . Hypertension   . TIA (transient ischemic attack) 2008  . Peripheral vascular disease   . PVD (peripheral vascular disease) 02/21/2013   Past Surgical History:  Past Surgical History  Procedure Laterality Date  . Abdominal aortic aneurysm repair  1995  . Prostate surgery  1994  . Colon surgery  2010   HPI:  Kevin Escobar is an 72 y.o. male with a past medical history significant for HTN, TIA, CKD, renal artery stenosis s/p stent placement, AAA repair in 1995, prostate and bladder cancer, admitted to [facility] with new onset right hemiparesis and 2 episodes of transient language impairment..  Pt  was seen by Speech pathologist on Sunday with recommendations for dys2/thin, overtnight he had episode of vomiting and "gurgling" after intake of water, also with increased weakness, facial droop on right and slurred speech.  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  BSE repeated yesterday with diet of puree/thin initiated,  MBS indicated due to concern for dysphagia even PTA.     Assessment / Plan / Recommendation Clinical Impression  Pt presents with moderate mixed dysarthria characterized by rapid rate of imprecise speech resulting in decreased intelligibility and weak phonation.  Pt is amenable to cues to slow rate and  increase effort which increases intelligibilty significantly.  Pt demonstrates poor awareness/judgement to physical ramifications from his CVA expressing desire to go home and indicating he does not agree with Rehab stay.  Suspect pt with baseline independent personality as he does problem solve and asks for assistance to open containers, etc.    Pt would benefit from skilled SLP to maximize his functional cognitive linguistic skills and swallow function to decrease caregiver burden.    SLP Assessment  Patient needs continued Speech Lanaguage Pathology Services    Follow Up Recommendations    CIR   Frequency and Duration min 2x/week  2 weeks   Pertinent Vitals/Pain Afebrile, decreased   SLP Goals  SLP Goals Potential Considerations: Family/community support SLP Goal #1: Pt will increase speech intelligibility to approximately 85% at sentence level with moderate verbal cues for compensation strategies.    SLP Goal #2: Pt will increase vocal amplitude for listener to hear in quiet environment with moderate verbal cues for compensation strategies.   SLP Goal #3: Pt will demonstrate intellectual awareness to speech and swallowing ramifications of CVA with min verbal cues.  SLP Goal #4: Pt will answer moderately complex functional problem solving questions for daily activities with min verbal cues.    SLP Evaluation Prior Functioning   independent PTA   Cognition  Overall Cognitive Status: Impaired/Different from baseline Orientation Level: Oriented X4 [facility] hospital" delayed "[place]") Attention: Sustained Sustained Attention: Impaired Memory: Impaired Awareness: Impaired Awareness Impairment: Intellectual impairment (decreased awareness to dysphagia) Problem Solving: Impaired (did not call for assist to  have HOB raised, repositioned) Problem Solving Impairment: Verbal basic (pt desires to go home at this time, does not agree w/CIR) Executive Function: Reasoning Reasoning:  Impaired Reasoning Impairment: Verbal basic;Functional basic Behaviors: Restless;Poor frustration tolerance;Lability Safety/Judgment: Impaired    Comprehension  Auditory Comprehension Overall Auditory Comprehension: Appears within functional limits for tasks assessed Yes/No Questions: Not tested (to be further tested ) Commands: Not tested (to be further tested) Conversation: Complex (re: careplan, able to follow complex conversation) Visual Recognition/Discrimination Discrimination: Not tested Reading Comprehension Reading Status:  (pt read calendar date)    Expression Expression Primary Mode of Expression: Verbal Verbal Expression Overall Verbal Expression: Impaired Initiation: No impairment Level of Generative/Spontaneous Verbalization: Sentence Repetition: No impairment Naming: Not tested Pragmatics: Impairment Impairments: Abnormal affect;Monotone;Dysprosody Written Expression Dominant Hand: Right Written Expression: Not tested   Oral / Motor Oral Motor/Sensory Function Overall Oral Motor/Sensory Function: Impaired Labial ROM: Reduced right Labial Symmetry: Within Functional Limits Labial Strength: Reduced Labial Sensation: Reduced Lingual ROM: Within Functional Limits Lingual Symmetry: Within Functional Limits Lingual Strength: Within Functional Limits Lingual Sensation: Within Functional Limits Facial ROM: Reduced right Facial Symmetry: Right droop Facial Strength: Reduced Facial Sensation: Reduced Velum: Within Functional Limits Mandible: Within Functional Limits Motor Speech Respiration: Impaired Phonation: Breathy;Low vocal intensity Resonance: Hyponasality Articulation: Impaired Intelligibility: Intelligibility reduced Word: 50-74% accurate Phrase: 50-74% accurate Sentence: 50-74% accurate Motor Planning: Not tested Motor Speech Errors: Not applicable Effective Techniques: Increased vocal intensity;Over-articulate   GO     Kevin Koller, MS [facility] SLP [phone]

## 2013-02-25 NOTE — Progress Notes (Signed)
OT Cancellation Note  Patient Details Name: Kevin Escobar MRN: [id number] DOB: 07-Sep-1940   Cancelled Treatment:    Reason Eval/Treat Not Completed: Patient at procedure or test/ unavailable (at [facility] with SLP off floor)  Lucile Shutters Pager: [phone]  02/25/2013, 8:46 AM

## 2013-02-25 NOTE — H&P (Signed)
Physical Medicine and Rehabilitation Admission H&P    Chief Complaint  Patient presents with  . Right sided weakness, dysphagia,   : HPI: Kevin Escobar is a 72 y.o. male with a past medical history significant for HTN, TIA, CKD, lupus, renal artery stenosis s/p stent placement, AAA repair in 1995, prostate and bladder cancer; admitted on 02/21/13 with progressive difficulty walking due to right sided weakness and fall as well and 2 episodes of transient language impairment for >24 hours PTA. MRI/MRA brain with acute left posterior limb infarct and no IC stenosis. 2D echo with EF 60-65% and No wall abnormality. Carotid dopplers with mild soft plaque L-ICA origin. Patient with neurological worsening on 07/21 with vomiting/?aspiration event. He was placed on bedrest and follow up CCT stable.  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  Patient has had intermittent episodes of SOB attributed to anxiety. Patient with evidence of dysphagia nd diet down graded. MBS done today revealing moderate sensorimotor oropharyngeal  dysphagia exacerbated by fatigue factor as well as suspicion of aspiration of secretions. D1 diet with nectar liquids and single ice chips recommended due to aspiration of thins. Therapy ongoing and working on posture and pre-gait activities. Participation in therapies improving and CIR recommended by rehab team.    Review of Systems  HENT: Positive for hearing loss (in right ear).   Eyes: Negative for blurred vision and double vision.  Respiratory: Positive for cough and shortness of breath.   Cardiovascular: Negative for chest pain and palpitations.  Gastrointestinal: Negative for nausea, vomiting and abdominal pain.  Genitourinary: Positive for urgency and frequency.  Musculoskeletal: Negative for myalgias and back pain.  Neurological: Negative for headaches.  Psychiatric/Behavioral: The patient is nervous/anxious.     Past Medical History  Diagnosis Date  . Cancer of bladder 2010  . Cancer of prostate 1994  . Depression   . Chronic kidney disease   . Hypertension   . TIA (transient ischemic attack) 2008  . Peripheral vascular disease   . PVD (peripheral vascular disease) 02/21/2013   Past Surgical History  Procedure Laterality Date  . Abdominal aortic aneurysm repair  1995  . Prostate surgery  1994  . Colon surgery  2010   History reviewed. No pertinent family history.  Social History:  Retired. 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 He reports that he has been smoking--1-2 PPD.  He has never used smokeless tobacco. He reports that he does not drink alcohol or use illicit drugs.   Allergies  Allergen Reactions  . Other Rash    "Cheerios cause me to break out--bumps all over my chest"  . Plaquenil (Hydroxychloroquine Sulfate) Rash    Severe rash    Medications Prior to Admission  Medication Sig Dispense Refill  . acetaminophen (TYLENOL) 500 MG tablet Take 1,500 mg by mouth daily. pain      . amLODipine (NORVASC) 10 MG tablet Take 10 mg by mouth See admin instructions. Takes 1 tablet every day at noon and a 2nd tablet 2-3 times week (random days) in the evening      . cloNIDine (CATAPRES) 0.1 MG tablet Take 0.1 mg by mouth See admin instructions. Takes 1 tablet every day at noon and 2-3 times week (random days) takes a 2nd tablet in the evening      . diphenhydrAMINE (BENADRYL) 25 mg capsule Take 25-50 mg by mouth daily as needed for itching.      . furosemide (LASIX)   40 MG tablet Take 80 mg by mouth See admin instructions. Takes 2 tablets (80 mg) daily at noon and twice a week (random days) takes an extra tablet at 6pm      . metoprolol (LOPRESSOR) 100 MG tablet Take 25 mg by mouth daily with lunch.      . simvastatin (ZOCOR) 10 MG tablet Take 10 mg by mouth daily with lunch.       . traZODone (DESYREL) 50 MG tablet Take 50-100 mg by mouth at bedtime as needed for sleep.         Home: Home Living Family/patient expects to be discharged to:: Inpatient rehab Living Arrangements: Spouse/significant other Available Help at Discharge: Family;Available 24 hours/day Type of Home: House Home Access: Stairs to enter Entrance Stairs-Number of Steps: 4 Entrance Stairs-Rails: Right Home Layout: One level Home Equipment: Cane - quad;Cane - single point   Functional History: Prior Function Comments: Pt was driving, independent in all ADL and mobility  Functional Status:  Mobility: Bed Mobility Bed Mobility: Supine to Sit Rolling Right: 4: Min assist;With rail Right Sidelying to Sit: 3: Mod assist;HOB flat;With rails Supine to Sit: 4: Min assist;HOB elevated;With rails Sitting - Scoot to Edge of Bed: 4: Min assist (use of pad to assist) Sit to Supine: 3: Mod assist;HOB flat Transfers Transfers: Sit to Stand;Stand to Sit;Stand Pivot Transfers Sit to Stand: 1: +2 Total assist;From bed Sit to Stand: Patient Percentage: 40% Stand to Sit: 1: +2 Total assist;To chair/3-in-1 Stand to Sit: Patient Percentage: 40% Stand Pivot Transfers: 1: +2 Total assist Stand Pivot Transfers: Patient Percentage: 40% Ambulation/Gait Ambulation/Gait Assistance: Not tested (comment)    ADL: ADL Eating/Feeding: Maximal assistance Where Assessed - Eating/Feeding: Bed level Grooming: Wash/dry face;Moderate assistance Where Assessed - Grooming: Supported sitting Upper Body Bathing: Moderate assistance Where Assessed - Upper Body Bathing: Supported sitting Lower Body Bathing: +1 Total assistance Where Assessed - Lower Body Bathing: Supported sitting Upper Body Dressing: Maximal assistance Where Assessed - Upper Body Dressing: Supported sitting Lower Body Dressing: +1 Total assistance Where Assessed - Lower Body Dressing: Supported sitting Toilet Transfer: +2 Total assistance Toilet Transfer Method: Stand pivot Toilet Transfer Equipment: Bedside commode Tub/Shower Transfer: +2  Total assistance Tub/Shower Transfer Method: Not assessed Transfers/Ambulation Related to ADLs: not attempted this session ADL Comments: Pt sat EOB for approximately 15 min supported. Pt could hold himself up, but fatigued quickly. Pt able to wash face supported sitting with LUE. Pt with max vc's to keep his head up.  Cognition: Cognition Overall Cognitive Status: Impaired/Different from baseline Orientation Level: Oriented X4 ("[facility]" delayed "[place]") Attention: Sustained Sustained Attention: Impaired Memory: Impaired Awareness: Impaired Awareness Impairment: Intellectual impairment (decreased awareness to dysphagia) Problem Solving: Impaired (did not call for assist to have HOB raised, repositioned) Problem Solving Impairment: Verbal basic (pt desires to go home at this time, does not agree w/CIR) Executive Function: Reasoning Reasoning: Impaired Reasoning Impairment: Verbal basic;Functional basic Behaviors: Restless;Poor frustration tolerance;Lability Safety/Judgment: Impaired Cognition Arousal/Alertness: Awake/alert Behavior During Therapy: WFL for tasks assessed/performed Overall Cognitive Status: Impaired/Different from baseline Area of Impairment: Attention Current Attention Level: Sustained General Comments: appears very uncomfortable throughout session, cues for attention to task, difficult getting a clear picture of what is bothering him, doesn't want to hold his eyes open or fixate on any one item, says he feels better with eyes closed  Physical Exam: Blood pressure 175/86, pulse 109, temperature 98.6 F (37 C), temperature source Oral, resp. rate 18, height 5' 11" (1.803 m), weight   67.7 kg (149 lb 4 oz), SpO2 95.00%.  General:  Appears fatigued. NAD. Panting noted with anxious affect occasionally.  HEENT: Head is normocephalic, atraumatic, PERRLA, EOMI, sclera anicteric, oral mucosa dry and flaky appearing. dentition intact, ext ear canals clear,   Neck: Supple without JVD or lymphadenopathy  Heart: reg. No murmurs rubs or gallops  Chest: CTA bilaterally without wheezes, rales, or rhonchi; no distress.  Abdomen: Soft, non-tender, non-distended, bowel sounds positive.  Extremities: No clubbing, cyanosis, or edema Pulses are 2+,  Skin: Clean and intact without signs of breakdown  Neuro: speech dysarthric, right central 7 and tongue deviation. Sensation grossly intact. Intact basic insight and awareness. RUE is 1+ deltoid, bicep, pecs, 0 to trace tricep, 0 at wrist/hand.  RLE is tr to 1 at HE, KE and 0 elsewhere. Senses pain in arm and leg. dtr's 1+  Musculoskeletal: Full ROM, No pain with AROM or PROM in the neck, trunk, or extremities. Posture appropriate  Psych: Pt's affect is appropriate. Pt is cooperative       Results for orders placed during the hospital encounter of 02/20/13 (from the past 48 hour(s))  GLUCOSE, CAPILLARY     Status: Abnormal   Collection Time    02/23/13 11:48 AM      Result Value Range   Glucose-Capillary 115 (*) 70 - 99 mg/dL  CBC     Status: Abnormal   Collection Time    02/23/13  2:10 PM      Result Value Range   WBC 15.4 (*) 4.0 - 10.5 K/uL   RBC 4.53  4.22 - 5.81 MIL/uL   Hemoglobin 13.4  13.0 - 17.0 g/dL   HCT 40.0  39.0 - 52.0 %   MCV 88.3  78.0 - 100.0 fL   MCH 29.6  26.0 - 34.0 pg   MCHC 33.5  30.0 - 36.0 g/dL   RDW 14.0  11.5 - 15.5 %   Platelets 207  150 - 400 K/uL  GLUCOSE, CAPILLARY     Status: None   Collection Time    02/23/13  4:45 PM      Result Value Range   Glucose-Capillary 94  70 - 99 mg/dL  GLUCOSE, CAPILLARY     Status: None   Collection Time    02/23/13  9:41 PM      Result Value Range   Glucose-Capillary 98  70 - 99 mg/dL  TROPONIN I     Status: None   Collection Time    02/24/13 12:25 AM      Result Value Range   Troponin I <0.30  <0.30 ng/mL   Comment:            Due to the release kinetics of cTnI,     a negative result within the first hours     of the  onset of symptoms does not rule out     myocardial infarction with certainty.     If myocardial infarction is still suspected,     repeat the test at appropriate intervals.  BASIC METABOLIC PANEL     Status: Abnormal   Collection Time    02/24/13  5:25 AM      Result Value Range   Sodium 144  135 - 145 mEq/L   Potassium 3.8  3.5 - 5.1 mEq/L   Chloride 107  96 - 112 mEq/L   CO2 22  19 - 32 mEq/L   Glucose, Bld 98  70 - 99 mg/dL   BUN   40 (*) 6 - 23 mg/dL   Creatinine, Ser 2.90 (*) 0.50 - 1.35 mg/dL   Calcium 10.1  8.4 - 10.5 mg/dL   GFR calc non Af Amer 20 (*) >90 mL/min   GFR calc Af Amer 23 (*) >90 mL/min   Comment:            The eGFR has been calculated     using the CKD EPI equation.     This calculation has not been     validated in all clinical     situations.     eGFR's persistently     <90 mL/min signify     possible Chronic Kidney Disease.  CBC WITH DIFFERENTIAL     Status: Abnormal   Collection Time    02/24/13  5:25 AM      Result Value Range   WBC 11.9 (*) 4.0 - 10.5 K/uL   RBC 4.74  4.22 - 5.81 MIL/uL   Hemoglobin 14.5  13.0 - 17.0 g/dL   HCT 41.8  39.0 - 52.0 %   MCV 88.2  78.0 - 100.0 fL   MCH 30.6  26.0 - 34.0 pg   MCHC 34.7  30.0 - 36.0 g/dL   RDW 13.9  11.5 - 15.5 %   Platelets 197  150 - 400 K/uL   Neutrophils Relative % 73  43 - 77 %   Neutro Abs 8.7 (*) 1.7 - 7.7 K/uL   Lymphocytes Relative 14  12 - 46 %   Lymphs Abs 1.7  0.7 - 4.0 K/uL   Monocytes Relative 11  3 - 12 %   Monocytes Absolute 1.3 (*) 0.1 - 1.0 K/uL   Eosinophils Relative 2  0 - 5 %   Eosinophils Absolute 0.2  0.0 - 0.7 K/uL   Basophils Relative 1  0 - 1 %   Basophils Absolute 0.1  0.0 - 0.1 K/uL  GLUCOSE, CAPILLARY     Status: Abnormal   Collection Time    02/24/13  6:30 AM      Result Value Range   Glucose-Capillary 107 (*) 70 - 99 mg/dL  GLUCOSE, CAPILLARY     Status: Abnormal   Collection Time    02/24/13 12:03 PM      Result Value Range   Glucose-Capillary 116 (*) 70 -  99 mg/dL  GLUCOSE, CAPILLARY     Status: Abnormal   Collection Time    02/24/13  4:20 PM      Result Value Range   Glucose-Capillary 117 (*) 70 - 99 mg/dL  GLUCOSE, CAPILLARY     Status: Abnormal   Collection Time    02/24/13  9:28 PM      Result Value Range   Glucose-Capillary 100 (*) 70 - 99 mg/dL   Comment 1 Documented in Chart     Comment 2 Notify RN    GLUCOSE, CAPILLARY     Status: Abnormal   Collection Time    02/25/13  7:10 AM      Result Value Range   Glucose-Capillary 100 (*) 70 - 99 mg/dL   Comment 1 Documented in Chart     Comment 2 Notify RN       Post Admission Physician Evaluation: 1. Functional deficits secondary  to thrombotic left PLIC infarct. 2. Patient is admitted to receive collaborative, interdisciplinary care between the physiatrist, rehab nursing staff, and therapy team. 3. Patient's level of medical complexity and substantial therapy needs in context of that medical necessity cannot   be provided at a lesser intensity of care such as a SNF. 4. Patient has experienced substantial functional loss from his/her baseline which was documented above under the "Functional History" and "Functional Status" headings.  Judging by the patient's diagnosis, physical exam, and functional history, the patient has potential for functional progress which will result in measurable gains while on inpatient rehab.  These gains will be of substantial and practical use upon discharge  in facilitating mobility and self-care at the household level. 5. Physiatrist will provide 24 hour management of medical needs as well as oversight of the therapy plan/treatment and provide guidance as appropriate regarding the interaction of the two. 6. 24 hour rehab nursing will assist with bladder management, bowel management, safety, skin/wound care, disease management, medication administration, pain management and patient education  and help integrate therapy concepts, techniques,education,  etc. 7. PT will assess and treat for/with: Lower extremity strength, range of motion, stamina, balance, functional mobility, safety, adaptive techniques and equipment, NMR, education.   Goals are: min assist. 8. OT will assess and treat for/with: ADL's, functional mobility, safety, upper extremity strength, adaptive techniques and equipment, NMR, education.   Goals are: min assist. 9. SLP will assess and treat for/with: speech, communication, swallowing.  Goals are: supervision to mod I. 10. Case Management and Social Worker will assess and treat for psychological issues and discharge planning. 11. Team conference will be held weekly to assess progress toward goals and to determine barriers to discharge. 12. Patient will receive at least 3 hours of therapy per day at least 5 days per week. 13. ELOS: 2-3 weeks       14. Prognosis:  excellent   Medical Problem List and Plan: 1. DVT Prophylaxis/Anticoagulation: Pharmaceutical: Lovenox 2. Pain Management: N/A 3. Mood: Provide anxiety for support. Has a supportive family. Will add nicotine patch per family request (used 1 1/2-2PPD) LCSW to follow for evaluation and support. Provide ego support.  4. Neuropsych: This patient is capable of making decisions on his own behalf. 5. HTN: will monitor with bid checks. Continue metoprolol, norvasc and catapres. Off lasix due to renal insufficiency/dysphagia diet.  6. Reactive leucocytosis: Resolving.  7. Dyslipidemia: Continue lipitor.  8. Dysphagia: continue D1, nectar liquids. Continue to educate patient and family on compliance of current restrictions and aspiration risk. Add nocturnal fluid for hydration as appears dry. 9. Prostate cancer/bladder CA: H/o frequency. Toilet every 2-3 hours to help with continence. Condom cath at night.  10. CKD: Will monitor with routine checks. Currently at baseline.    Latesa Fratto T. Syleena Mchan, MD, FAAPMR [facility]   02/25/2013 

## 2013-02-26 ENCOUNTER — Inpatient Hospital Stay (HOSPITAL_COMMUNITY)
Admission: RE | Admit: 2013-02-26 | Discharge: 2013-03-26 | DRG: 945 | Disposition: A | Discharge status: Home Health | Payer: Medicare Other | Source: Intra-hospital | Attending: Physical Medicine & Rehabilitation | Admitting: Physical Medicine & Rehabilitation

## 2013-02-26 DIAGNOSIS — N184 Chronic kidney disease, stage 4 (severe): Secondary | ICD-10-CM | POA: Diagnosis present

## 2013-02-26 DIAGNOSIS — I739 Peripheral vascular disease, unspecified: Secondary | ICD-10-CM

## 2013-02-26 DIAGNOSIS — N189 Chronic kidney disease, unspecified: Secondary | ICD-10-CM

## 2013-02-26 DIAGNOSIS — F329 Major depressive disorder, single episode, unspecified: Secondary | ICD-10-CM

## 2013-02-26 DIAGNOSIS — I129 Hypertensive chronic kidney disease with stage 1 through stage 4 chronic kidney disease, or unspecified chronic kidney disease: Secondary | ICD-10-CM

## 2013-02-26 DIAGNOSIS — Z8673 Personal history of transient ischemic attack (TIA), and cerebral infarction without residual deficits: Secondary | ICD-10-CM

## 2013-02-26 DIAGNOSIS — I639 Cerebral infarction, unspecified: Secondary | ICD-10-CM | POA: Diagnosis present

## 2013-02-26 DIAGNOSIS — J438 Other emphysema: Secondary | ICD-10-CM | POA: Insufficient documentation

## 2013-02-26 DIAGNOSIS — R259 Unspecified abnormal involuntary movements: Secondary | ICD-10-CM

## 2013-02-26 DIAGNOSIS — F341 Dysthymic disorder: Secondary | ICD-10-CM

## 2013-02-26 DIAGNOSIS — K5900 Constipation, unspecified: Secondary | ICD-10-CM

## 2013-02-26 DIAGNOSIS — F17200 Nicotine dependence, unspecified, uncomplicated: Secondary | ICD-10-CM

## 2013-02-26 DIAGNOSIS — G3184 Mild cognitive impairment, so stated: Secondary | ICD-10-CM

## 2013-02-26 DIAGNOSIS — N390 Urinary tract infection, site not specified: Secondary | ICD-10-CM

## 2013-02-26 DIAGNOSIS — Z8546 Personal history of malignant neoplasm of prostate: Secondary | ICD-10-CM

## 2013-02-26 DIAGNOSIS — E785 Hyperlipidemia, unspecified: Secondary | ICD-10-CM

## 2013-02-26 DIAGNOSIS — G8190 Hemiplegia, unspecified affecting unspecified side: Secondary | ICD-10-CM

## 2013-02-26 DIAGNOSIS — Z5189 Encounter for other specified aftercare: Principal | ICD-10-CM

## 2013-02-26 DIAGNOSIS — I6330 Cerebral infarction due to thrombosis of unspecified cerebral artery: Secondary | ICD-10-CM

## 2013-02-26 DIAGNOSIS — R1312 Dysphagia, oropharyngeal phase: Secondary | ICD-10-CM

## 2013-02-26 DIAGNOSIS — Z8551 Personal history of malignant neoplasm of bladder: Secondary | ICD-10-CM

## 2013-02-26 DIAGNOSIS — R471 Dysarthria and anarthria: Secondary | ICD-10-CM

## 2013-02-26 DIAGNOSIS — M329 Systemic lupus erythematosus, unspecified: Secondary | ICD-10-CM

## 2013-02-26 DIAGNOSIS — L723 Sebaceous cyst: Secondary | ICD-10-CM

## 2013-02-26 DIAGNOSIS — I10 Essential (primary) hypertension: Secondary | ICD-10-CM | POA: Diagnosis present

## 2013-02-26 LAB — GLUCOSE, CAPILLARY
Glucose-Capillary: 86 mg/dL (ref 70–99)
Glucose-Capillary: 101 mg/dL — ABNORMAL HIGH (ref 70–99)
Glucose-Capillary: 146 mg/dL — ABNORMAL HIGH (ref 70–99)

## 2013-02-26 MED ORDER — CLONIDINE HCL 0.1 MG PO TABS
0.1000 mg | ORAL_TABLET | Freq: Two times a day (BID) | ORAL | Status: DC
  Administered 2013-02-26 – 2013-03-02 (×9): 0.1 mg via ORAL
  Filled 2013-02-26 (×13): qty 1

## 2013-02-26 MED ORDER — SODIUM CHLORIDE 0.9 % IJ SOLN
3.0000 mL | Freq: Two times a day (BID) | INTRAMUSCULAR | Status: DC
  Administered 2013-03-01 – 2013-03-24 (×18): 3 mL via INTRAVENOUS

## 2013-02-26 MED ORDER — PROCHLORPERAZINE 25 MG RE SUPP
12.5000 mg | Freq: Four times a day (QID) | RECTAL | Status: DC | PRN
  Filled 2013-02-26: qty 1

## 2013-02-26 MED ORDER — NICOTINE 14 MG/24HR TD PT24
14.0000 mg | MEDICATED_PATCH | Freq: Every day | TRANSDERMAL | Status: AC
  Administered 2013-02-26 – 2013-03-18 (×21): 14 mg via TRANSDERMAL
  Filled 2013-02-26 (×23): qty 1

## 2013-02-26 MED ORDER — TRAZODONE HCL 50 MG PO TABS
50.0000 mg | ORAL_TABLET | Freq: Every evening | ORAL | Status: DC | PRN
  Administered 2013-02-26 – 2013-02-27 (×2): 50 mg via ORAL
  Filled 2013-02-26 (×3): qty 1

## 2013-02-26 MED ORDER — METOPROLOL TARTRATE 25 MG PO TABS
25.0000 mg | ORAL_TABLET | Freq: Two times a day (BID) | ORAL | Status: DC
  Administered 2013-02-26 – 2013-03-17 (×39): 25 mg via ORAL
  Filled 2013-02-26 (×42): qty 1

## 2013-02-26 MED ORDER — PROCHLORPERAZINE EDISYLATE 5 MG/ML IJ SOLN
5.0000 mg | Freq: Four times a day (QID) | INTRAMUSCULAR | Status: DC | PRN
  Filled 2013-02-26: qty 2

## 2013-02-26 MED ORDER — CLOPIDOGREL BISULFATE 75 MG PO TABS
75.0000 mg | ORAL_TABLET | Freq: Every day | ORAL | Status: DC
  Administered 2013-02-27 – 2013-03-26 (×28): 75 mg via ORAL
  Filled 2013-02-26 (×30): qty 1

## 2013-02-26 MED ORDER — ATORVASTATIN CALCIUM 20 MG PO TABS
20.0000 mg | ORAL_TABLET | Freq: Every day | ORAL | Status: DC
  Administered 2013-02-26 – 2013-03-25 (×28): 20 mg via ORAL
  Filled 2013-02-26 (×30): qty 1

## 2013-02-26 MED ORDER — SODIUM CHLORIDE 0.9 % IV SOLN
INTRAVENOUS | Status: DC
  Administered 2013-02-26 – 2013-03-08 (×7): via INTRAVENOUS

## 2013-02-26 MED ORDER — BISACODYL 10 MG RE SUPP
10.0000 mg | Freq: Every day | RECTAL | Status: DC | PRN
  Administered 2013-03-05 – 2013-03-25 (×4): 10 mg via RECTAL
  Filled 2013-02-26 (×4): qty 1

## 2013-02-26 MED ORDER — DIPHENHYDRAMINE HCL 12.5 MG/5ML PO ELIX
12.5000 mg | ORAL_SOLUTION | Freq: Four times a day (QID) | ORAL | Status: DC | PRN
  Administered 2013-03-16: 25 mg via ORAL
  Filled 2013-02-26: qty 10

## 2013-02-26 MED ORDER — GUAIFENESIN-DM 100-10 MG/5ML PO SYRP: 5.0000 mL | ORAL_SOLUTION | Freq: Four times a day (QID) | ORAL | Status: DC | PRN

## 2013-02-26 MED ORDER — ENOXAPARIN SODIUM 30 MG/0.3ML ~~LOC~~ SOLN
30.0000 mg | SUBCUTANEOUS | Status: DC
  Administered 2013-02-27 – 2013-03-26 (×28): 30 mg via SUBCUTANEOUS
  Filled 2013-02-26 (×29): qty 0.3

## 2013-02-26 MED ORDER — ALUM & MAG HYDROXIDE-SIMETH 200-200-20 MG/5ML PO SUSP: 30.0000 mL | ORAL | Status: DC | PRN

## 2013-02-26 MED ORDER — BIOTENE DRY MOUTH MT LIQD
15.0000 mL | Freq: Four times a day (QID) | OROMUCOSAL | Status: DC
  Administered 2013-02-27 – 2013-03-26 (×76): 15 mL via OROMUCOSAL

## 2013-02-26 MED ORDER — PNEUMOCOCCAL VAC POLYVALENT 25 MCG/0.5ML IJ INJ
0.5000 mL | INJECTION | INTRAMUSCULAR | Status: AC
  Administered 2013-02-27: 0.5 mL via INTRAMUSCULAR
  Filled 2013-02-26: qty 0.5

## 2013-02-26 MED ORDER — ACETAMINOPHEN 325 MG PO TABS
325.0000 mg | ORAL_TABLET | ORAL | Status: DC | PRN
  Administered 2013-02-27 – 2013-03-14 (×9): 650 mg via ORAL
  Filled 2013-02-26: qty 1
  Filled 2013-02-26 (×10): qty 2

## 2013-02-26 MED ORDER — ATORVASTATIN CALCIUM 20 MG PO TABS: 20.0000 mg | ORAL_TABLET | Freq: Every day | ORAL | Status: DC

## 2013-02-26 MED ORDER — PROCHLORPERAZINE MALEATE 5 MG PO TABS
5.0000 mg | ORAL_TABLET | Freq: Four times a day (QID) | ORAL | Status: DC | PRN
  Filled 2013-02-26: qty 2

## 2013-02-26 MED ORDER — NICOTINE 7 MG/24HR TD PT24
7.0000 mg | MEDICATED_PATCH | Freq: Every day | TRANSDERMAL | Status: DC
  Administered 2013-03-19 – 2013-03-26 (×8): 7 mg via TRANSDERMAL
  Filled 2013-02-26 (×9): qty 1

## 2013-02-26 MED ORDER — LORAZEPAM 2 MG/ML IJ SOLN: 0.5000 mg | Freq: Three times a day (TID) | INTRAMUSCULAR | Status: DC | PRN

## 2013-02-26 MED ORDER — SENNOSIDES-DOCUSATE SODIUM 8.6-50 MG PO TABS
1.0000 | ORAL_TABLET | Freq: Every evening | ORAL | Status: DC | PRN
  Administered 2013-02-27 – 2013-03-23 (×3): 1 via ORAL
  Filled 2013-02-26 (×3): qty 1

## 2013-02-26 MED ORDER — AMLODIPINE BESYLATE 10 MG PO TABS
10.0000 mg | ORAL_TABLET | Freq: Every day | ORAL | Status: DC
  Administered 2013-02-27 – 2013-03-26 (×28): 10 mg via ORAL
  Filled 2013-02-26 (×29): qty 1

## 2013-02-26 MED ORDER — CLOPIDOGREL BISULFATE 75 MG PO TABS: 75.0000 mg | ORAL_TABLET | Freq: Every day | ORAL | Status: DC

## 2013-02-26 NOTE — H&P (View-Only) (Signed)
Physical Medicine and Rehabilitation Admission H&P    Chief Complaint  Patient presents with  . Right sided weakness, dysphagia,   : HPI: Kevin Escobar is a 72 y.o. male with a past medical history significant for HTN, TIA, CKD, lupus, renal artery stenosis s/p stent placement, AAA repair in 1995, prostate and bladder cancer; admitted on 02/21/13 with progressive difficulty walking due to right sided weakness and fall as well and 2 episodes of transient language impairment for >24 hours PTA. MRI/MRA brain with acute left posterior limb infarct and no IC stenosis. 2D echo with EF 60-65% and No wall abnormality. Carotid dopplers with mild soft plaque L-ICA origin. Patient with neurological worsening on 07/21 with vomiting/?aspiration event. He was placed on bedrest and follow up CCT stable.  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  Patient has had intermittent episodes of SOB attributed to anxiety. Patient with evidence of dysphagia nd diet down graded. MBS done today revealing moderate sensorimotor oropharyngeal  dysphagia exacerbated by fatigue factor as well as suspicion of aspiration of secretions. D1 diet with nectar liquids and single ice chips recommended due to aspiration of thins. Therapy ongoing and working on posture and pre-gait activities. Participation in therapies improving and CIR recommended by rehab team.    Review of Systems  HENT: Positive for hearing loss (in right ear).   Eyes: Negative for blurred vision and double vision.  Respiratory: Positive for cough and shortness of breath.   Cardiovascular: Negative for chest pain and palpitations.  Gastrointestinal: Negative for nausea, vomiting and abdominal pain.  Genitourinary: Positive for urgency and frequency.  Musculoskeletal: Negative for myalgias and back pain.  Neurological: Negative for headaches.  Psychiatric/Behavioral: The patient is nervous/anxious.     Past Medical History  Diagnosis Date  . Cancer of bladder 2010  . Cancer of prostate 1994  . Depression   . Chronic kidney disease   . Hypertension   . TIA (transient ischemic attack) 2008  . Peripheral vascular disease   . PVD (peripheral vascular disease) 02/21/2013   Past Surgical History  Procedure Laterality Date  . Abdominal aortic aneurysm repair  1995  . Prostate surgery  1994  . Colon surgery  2010   History reviewed. No pertinent family history.  Social History:  Retired. 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 He reports that he has been smoking--1-2 PPD.  He has never used smokeless tobacco. He reports that he does not drink alcohol or use illicit drugs.   Allergies  Allergen Reactions  . Other Rash    "Cheerios cause me to break out--bumps all over my chest"  . Plaquenil (Hydroxychloroquine Sulfate) Rash    Severe rash    Medications Prior to Admission  Medication Sig Dispense Refill  . acetaminophen (TYLENOL) 500 MG tablet Take 1,500 mg by mouth daily. pain      . amLODipine (NORVASC) 10 MG tablet Take 10 mg by mouth See admin instructions. Takes 1 tablet every day at noon and a 2nd tablet 2-3 times week (random days) in the evening      . cloNIDine (CATAPRES) 0.1 MG tablet Take 0.1 mg by mouth See admin instructions. Takes 1 tablet every day at noon and 2-3 times week (random days) takes a 2nd tablet in the evening      . diphenhydrAMINE (BENADRYL) 25 mg capsule Take 25-50 mg by mouth daily as needed for itching.      . furosemide (LASIX)  40 MG tablet Take 80 mg by mouth See admin instructions. Takes 2 tablets (80 mg) daily at noon and twice a week (random days) takes an extra tablet at 6pm      . metoprolol (LOPRESSOR) 100 MG tablet Take 25 mg by mouth daily with lunch.      . simvastatin (ZOCOR) 10 MG tablet Take 10 mg by mouth daily with lunch.       . traZODone (DESYREL) 50 MG tablet Take 50-100 mg by mouth at bedtime as needed for sleep.         Home: Home Living Family/patient expects to be discharged to:: Inpatient rehab Living Arrangements: Spouse/significant other Available Help at Discharge: Family;Available 24 hours/day Type of Home: House Home Access: Stairs to enter [employer] of Steps: 4 Entrance Stairs-Rails: Right Home Layout: One level Home Equipment: Cane - quad;Cane - single point   Functional History: Prior Function Comments: Pt was driving, independent in all ADL and mobility  Functional Status:  Mobility: Bed Mobility Bed Mobility: Supine to Sit Rolling Right: 4: Min assist;With rail Right Sidelying to Sit: 3: Mod assist;HOB flat;With rails Supine to Sit: 4: Min assist;HOB elevated;With rails Sitting - Scoot to Edge of Bed: 4: Min assist (use of pad to assist) Sit to Supine: 3: Mod assist;HOB flat Transfers Transfers: Sit to Stand;Stand to Sit;Stand Pivot Transfers Sit to Stand: 1: +2 Total assist;From bed Sit to Stand: Patient Percentage: 40% Stand to Sit: 1: +2 Total assist;To chair/3-in-1 Stand to Sit: Patient Percentage: 40% Stand Pivot Transfers: 1: +2 Total assist Stand Pivot Transfers: Patient Percentage: 40% Ambulation/Gait Ambulation/Gait Assistance: Not tested (comment)    ADL: ADL Eating/Feeding: Maximal assistance Where Assessed - Eating/Feeding: Bed level Grooming: Wash/dry face;Moderate assistance Where Assessed - Grooming: Supported sitting Upper Body Bathing: Moderate assistance Where Assessed - Upper Body Bathing: Supported sitting Lower Body Bathing: +1 Total assistance Where Assessed - Lower Body Bathing: Supported sitting Upper Body Dressing: Maximal assistance Where Assessed - Upper Body Dressing: Supported sitting Lower Body Dressing: +1 Total assistance Where Assessed - Lower Body Dressing: Supported sitting Toilet Transfer: +2 Total assistance Toilet Transfer Method: Surveyor, minerals: Animator Transfer: +2  Total assistance Tub/Shower Transfer Method: Not assessed Transfers/Ambulation Related to ADLs: not attempted this session ADL Comments: Pt sat EOB for approximately 15 min supported. Pt could hold himself up, but fatigued quickly. Pt able to wash face supported sitting with LUE. Pt with max vc's to keep his head up.  Cognition: Cognition Overall Cognitive Status: Impaired/Different from baseline Orientation Level: Oriented X4 ("[place] [facility]" delayed "[place]") Attention: Sustained Sustained Attention: Impaired Memory: Impaired Awareness: Impaired Awareness Impairment: Intellectual impairment (decreased awareness to dysphagia) Problem Solving: Impaired (did not call for assist to have HOB raised, repositioned) Problem Solving Impairment: Verbal basic (pt desires to go home at this time, does not agree w/CIR) Executive Function: Reasoning Reasoning: Impaired Reasoning Impairment: Verbal basic;Functional basic Behaviors: Restless;Poor frustration tolerance;Lability Safety/Judgment: Impaired Cognition Arousal/Alertness: Awake/alert Behavior During Therapy: WFL for tasks assessed/performed Overall Cognitive Status: Impaired/Different from baseline Area of Impairment: Attention Current Attention Level: Sustained General Comments: appears very uncomfortable throughout session, cues for attention to task, difficult getting a clear picture of what is bothering him, doesn't want to hold his eyes open or fixate on any one item, says he feels better with eyes closed  Physical Exam: Blood pressure 175/86, pulse 109, temperature 98.6 F (37 C), temperature source Oral, resp. rate 18, height 5\' 11"  (1.803 m), weight  67.7 kg (149 lb 4 oz), SpO2 95.00%.  General:  Appears fatigued. NAD. Panting noted with anxious affect occasionally.  HEENT: Head is normocephalic, atraumatic, PERRLA, EOMI, sclera anicteric, oral mucosa dry and flaky appearing. dentition intact, ext ear canals clear,   Neck: Supple without JVD or lymphadenopathy  Heart: reg. No murmurs rubs or gallops  Chest: CTA bilaterally without wheezes, rales, or rhonchi; no distress.  Abdomen: Soft, non-tender, non-distended, bowel sounds positive.  Extremities: No clubbing, cyanosis, or edema Pulses are 2+,  Skin: Clean and intact without signs of breakdown  Neuro: speech dysarthric, right central 7 and tongue deviation. Sensation grossly intact. Intact basic insight and awareness. RUE is 1+ deltoid, bicep, pecs, 0 to trace tricep, 0 at wrist/hand.  RLE is tr to 1 at HE, KE and 0 elsewhere. Senses pain in arm and leg. dtr's 1+  Musculoskeletal: Full ROM, No pain with AROM or PROM in the neck, trunk, or extremities. Posture appropriate  Psych: Pt's affect is appropriate. Pt is cooperative       Results for orders placed during the hospital encounter of 02/20/13 (from the past 48 hour(s))  GLUCOSE, CAPILLARY     Status: Abnormal   Collection Time    02/23/13 11:48 AM      Result Value Range   Glucose-Capillary 115 (*) 70 - 99 mg/dL  CBC     Status: Abnormal   Collection Time    02/23/13  2:10 PM      Result Value Range   WBC 15.4 (*) 4.0 - 10.5 K/uL   RBC 4.53  4.22 - 5.81 MIL/uL   Hemoglobin 13.4  13.0 - 17.0 g/dL   HCT 16.1  09.6 - 04.5 %   MCV 88.3  78.0 - 100.0 fL   MCH 29.6  26.0 - 34.0 pg   MCHC 33.5  30.0 - 36.0 g/dL   RDW 40.9  81.1 - 91.4 %   Platelets 207  150 - 400 K/uL  GLUCOSE, CAPILLARY     Status: None   Collection Time    02/23/13  4:45 PM      Result Value Range   Glucose-Capillary 94  70 - 99 mg/dL  GLUCOSE, CAPILLARY     Status: None   Collection Time    02/23/13  9:41 PM      Result Value Range   Glucose-Capillary 98  70 - 99 mg/dL  TROPONIN I     Status: None   Collection Time    02/24/13 12:25 AM      Result Value Range   Troponin I <0.30  <0.30 ng/mL   Comment:            Due to the release kinetics of cTnI,     a negative result within the first hours     of the  onset of symptoms does not rule out     myocardial infarction with certainty.     If myocardial infarction is still suspected,     repeat the test at appropriate intervals.  BASIC METABOLIC PANEL     Status: Abnormal   Collection Time    02/24/13  5:25 AM      Result Value Range   Sodium 144  135 - 145 mEq/L   Potassium 3.8  3.5 - 5.1 mEq/L   Chloride 107  96 - 112 mEq/L   CO2 22  19 - 32 mEq/L   Glucose, Bld 98  70 - 99 mg/dL   BUN  40 (*) 6 - 23 mg/dL   Creatinine, Ser 9.60 (*) 0.50 - 1.35 mg/dL   Calcium 45.4  8.4 - 09.8 mg/dL   GFR calc non Af Amer 20 (*) >90 mL/min   GFR calc Af Amer 23 (*) >90 mL/min   Comment:            The eGFR has been calculated     using the CKD EPI equation.     This calculation has not been     validated in all clinical     situations.     eGFR's persistently     <90 mL/min signify     possible Chronic Kidney Disease.  CBC WITH DIFFERENTIAL     Status: Abnormal   Collection Time    02/24/13  5:25 AM      Result Value Range   WBC 11.9 (*) 4.0 - 10.5 K/uL   RBC 4.74  4.22 - 5.81 MIL/uL   Hemoglobin 14.5  13.0 - 17.0 g/dL   HCT 11.9  14.7 - 82.9 %   MCV 88.2  78.0 - 100.0 fL   MCH 30.6  26.0 - 34.0 pg   MCHC 34.7  30.0 - 36.0 g/dL   RDW 56.2  13.0 - 86.5 %   Platelets 197  150 - 400 K/uL   Neutrophils Relative % 73  43 - 77 %   Neutro Abs 8.7 (*) 1.7 - 7.7 K/uL   Lymphocytes Relative 14  12 - 46 %   Lymphs Abs 1.7  0.7 - 4.0 K/uL   Monocytes Relative 11  3 - 12 %   Monocytes Absolute 1.3 (*) 0.1 - 1.0 K/uL   Eosinophils Relative 2  0 - 5 %   Eosinophils Absolute 0.2  0.0 - 0.7 K/uL   Basophils Relative 1  0 - 1 %   Basophils Absolute 0.1  0.0 - 0.1 K/uL  GLUCOSE, CAPILLARY     Status: Abnormal   Collection Time    02/24/13  6:30 AM      Result Value Range   Glucose-Capillary 107 (*) 70 - 99 mg/dL  GLUCOSE, CAPILLARY     Status: Abnormal   Collection Time    02/24/13 12:03 PM      Result Value Range   Glucose-Capillary 116 (*) 70 -  99 mg/dL  GLUCOSE, CAPILLARY     Status: Abnormal   Collection Time    02/24/13  4:20 PM      Result Value Range   Glucose-Capillary 117 (*) 70 - 99 mg/dL  GLUCOSE, CAPILLARY     Status: Abnormal   Collection Time    02/24/13  9:28 PM      Result Value Range   Glucose-Capillary 100 (*) 70 - 99 mg/dL   Comment 1 Documented in Chart     Comment 2 Notify RN    GLUCOSE, CAPILLARY     Status: Abnormal   Collection Time    02/25/13  7:10 AM      Result Value Range   Glucose-Capillary 100 (*) 70 - 99 mg/dL   Comment 1 Documented in Chart     Comment 2 Notify RN       Post Admission Physician Evaluation: 1. Functional deficits secondary  to thrombotic left PLIC infarct. 2. Patient is admitted to receive collaborative, interdisciplinary care between the physiatrist, rehab nursing staff, and therapy team. 3. Patient's level of medical complexity and substantial therapy needs in context of that medical necessity cannot  be provided at a lesser intensity of care such as a SNF. 4. Patient has experienced substantial functional loss from his/her baseline which was documented above under the "Functional History" and "Functional Status" headings.  Judging by the patient's diagnosis, physical exam, and functional history, the patient has potential for functional progress which will result in measurable gains while on inpatient rehab.  These gains will be of substantial and practical use upon discharge  in facilitating mobility and self-care at the household level. 5. Physiatrist will provide 24 hour management of medical needs as well as oversight of the therapy plan/treatment and provide guidance as appropriate regarding the interaction of the two. 6. 24 hour rehab nursing will assist with bladder management, bowel management, safety, skin/wound care, disease management, medication administration, pain management and patient education  and help integrate therapy concepts, techniques,education,  etc. 7. PT will assess and treat for/with: Lower extremity strength, range of motion, stamina, balance, functional mobility, safety, adaptive techniques and equipment, NMR, education.   Goals are: min assist. 8. OT will assess and treat for/with: ADL's, functional mobility, safety, upper extremity strength, adaptive techniques and equipment, NMR, education.   Goals are: min assist. 9. SLP will assess and treat for/with: speech, communication, swallowing.  Goals are: supervision to mod I. 10. Case Management and Social Worker will assess and treat for psychological issues and discharge planning. 11. Team conference will be held weekly to assess progress toward goals and to determine barriers to discharge. 12. Patient will receive at least 3 hours of therapy per day at least 5 days per week. 13. ELOS: 2-3 weeks       14. Prognosis:  excellent   Medical Problem List and Plan: 1. DVT Prophylaxis/Anticoagulation: Pharmaceutical: Lovenox 2. Pain Management: N/A 3. Mood: Provide anxiety for support. Has a supportive family. Will add nicotine patch per family request (used 1 1/2-2PPD) LCSW to follow for evaluation and support. Provide ego support.  4. Neuropsych: This patient is capable of making decisions on his own behalf. 5. HTN: will monitor with bid checks. Continue metoprolol, norvasc and catapres. Off lasix due to renal insufficiency/dysphagia diet.  6. Reactive leucocytosis: Resolving.  7. Dyslipidemia: Continue lipitor.  8. Dysphagia: continue D1, nectar liquids. Continue to educate patient and family on compliance of current restrictions and aspiration risk. Add nocturnal fluid for hydration as appears dry. 9. Prostate cancer/bladder CA: H/o frequency. Toilet every 2-3 hours to help with continence. Condom cath at night.  10. CKD: Will monitor with routine checks. Currently at baseline.    Ranelle Oyster, MD, [facility] Physical Medicine & Rehabilitation   02/25/2013

## 2013-02-26 NOTE — Plan of Care (Signed)
Overall Plan of Care [facility]) Patient Details Name: Kevin Escobar MRN: [id number] DOB: [birth year]  Diagnosis:  Left PLIC infarct  Co-morbidities: htn, ckd, prostate cancer  Functional Problem List  Patient demonstrates impairments in the following areas: Balance, Bladder, Bowel, Cognition, Edema, Endurance, Linguistic, Medication Management, Motor, Nutrition, Safety, Sensory , Skin Integrity and Vision  Basic ADL's: eating, grooming, bathing, dressing and toileting Advanced ADL's: simple meal preparation  Transfers:  bed mobility, bed to chair, toilet, tub/shower, car and furniture Locomotion:  ambulation, wheelchair mobility and stairs  Additional Impairments:  Functional use of upper extremity, Swallowing, Communication  expression, Social Cognition   problem solving, memory, attention and awareness, Leisure Awareness and Discharge Disposition  Anticipated Outcomes Item Anticipated Outcome  Eating/Swallowing  Min A  Basic self-care  Min A overall   Tolieting  Min  A  Bowel/Bladder  Continent bowel/bladder with timed toileting  Transfers  S/min A  Locomotion  S/min-guard using LRAD x 50' and with 3 steps using handrail Mod-I w/c x 150'  Communication  Min A  Cognition  Supervision  Pain  No c/o pain  Safety/Judgment  24/7 Supervision   Other  Skin: no breakdown while on CIR   Therapy Plan: PT Intensity: Minimum of 1-2 x/day ,45 to 90 minutes PT Frequency: 5 out of 7 days PT Duration Estimated Length of Stay: 3-4 weeks OT Intensity: Minimum of 1-2 x/day, 45 to 90 minutes OT Frequency: 5 out of 7 days OT Duration/Estimated Length of Stay: 3-4 weeks SLP Frequency: 5 out of 7 days SLP Duration/Estimated Length of Stay: 2-3 weeks. SLP Intensity 1-2x/day, 30-45 minutes.    Team Interventions: Item RN PT OT SLP SW TR Other  Self Care/Advanced ADL Retraining   x      Neuromuscular Re-Education  x x      Therapeutic Activities  x x      UE/LE Strength  Training/ROM  x x      UE/LE Coordination Activities  x x      Visual/Perceptual Remediation/Compensation   x      DME/Adaptive Equipment Instruction  x x      Therapeutic Exercise  x x X     Balance/Vestibular Training  x x      Patient/Family Education x x x      Cognitive Remediation/Compensation   x X     Functional Mobility Training  x x      Ambulation/Gait Training  x       Stair Training  x       Wheelchair Propulsion/Positioning  x       Functional Statistician   x      Community Reintegration   x      Dysphagia/Aspiration Precaution Training   X X     Speech/Language Facilitation    X     Bladder Management x        Bowel Management x        Disease Management/Prevention x        Pain Management x  x      Medication Management x        Skin Care/Wound Management x  x      Splinting/Orthotics   x      Discharge Planning   x X x    Psychosocial Support   x X x  Team Discharge Planning: Destination: PT-Home ,OT- Home , SLP-Skilled Nursing Facility (SNF) Projected Follow-up: PT-Home health PT, OT-  Home health OT;Outpatient OT, SLP-Home Health SLP Projected Equipment Needs: PT- , OT-  , SLP-  Patient/family involved in discharge planning: PT- Patient,  OT-Patient, SLP-Patient  MD ELOS: 3-4 weeks Medical Rehab Prognosis:  Excellent Assessment: The patient has been admitted for CIR therapies. The team will be addressing, functional mobility, strength, stamina, balance, safety, adaptive techniques/equipment, self-care, bowel and bladder mgt, patient and caregiver education, speech, swallowing, communication, adjustment, psychosocial adjustment and education. Goals have been set at supervision to minimal assist.    Ranelle Oyster, MD, [facility]      See Team Conference Notes for weekly updates to the plan of care

## 2013-02-26 NOTE — Interval H&P Note (Signed)
Kevin Escobar was admitted today to Inpatient Rehabilitation with the diagnosis of left PLIC infarct.  The patient's history has been reviewed, patient examined, and there is no change in status.  Patient continues to be appropriate for intensive inpatient rehabilitation.  I have reviewed the patient's chart and labs.  Questions were answered to the patient's satisfaction.  SWARTZ,[name] 02/26/2013, 7:21 PM

## 2013-02-26 NOTE — Progress Notes (Signed)
Patient admitted to [phone] at 1710. Patient alert and oriented x 4, wife at bedside. Patient and wife oriented to unit, rehab schedule, safety plan, and call bell system. Rehab stroke education packet given. No further questions at this time. Patient and wife verbalized understanding. Kevin Escobar

## 2013-02-26 NOTE — Discharge Summary (Signed)
Physician Discharge Summary  Kevin Escobar:[id number] DOB: 10/24/1940 DOA: 02/20/2013  PCP: No primary provider on file.  Admit date: 02/20/2013 Discharge date: 02/26/2013  Time spent: 35 minutes  Recommendations for Outpatient Follow-up:  1. To CIR 2. Holding lasix-watch for weight gain 3. F/up Dr. Pearlean Brownie 2 months  Discharge Diagnoses:  Principal Problem:   CVA (cerebral vascular accident) Active Problems:   Chronic kidney disease   Hypertension   Peripheral vascular disease   Depression   PVD (peripheral vascular disease)   Hypokalemia   Discharge Condition: improved  Diet recommendation: DYS 1 diet; nectar thick  Filed Weights   02/20/13 1604 02/20/13 2054  Weight: 68.04 kg (150 lb) 67.7 kg (149 lb 4 oz)    History of present illness:  Kevin Escobar is a 72 y.o. male with Past medical history of recurrent TIA, CKD, renal artery stenosis s/p stent placement, AAA repair in 1995, Hypertension presented with the c/o difficulty speaking and imbalance that started last night.  The pt was at his baseline last night, when he was with his wife and suddenly he noted that he has difficulty expressing himself. That episode resolved. When he woke up this morning started having gradual progressing generalized weakness. At around 1:00 he had another episode of difficulty speaking. This followed by right-sided weakness and gait ataxia which progress further. He decided to come to the ED.  He has complained of fatigue since last few weeks.  He denies any complaint of fever, chills, chest pain, shortness of breath, headache, blurry vision, palpitation, leg swelling, diarrhea, urinary symptoms, any bleeding anywhere.  He says this episode is different from his prior TIA.   Hospital Course:  #1. Acute CVA  . MRI c/w CVA. 2-D echo with EF 60-65% with NWMA and no source of emboli. FLP with LDL 101. Carotid Dopplers preliminary result with no significant ICA stenosis. Continue Plavix  per neurology recommendations.  Risk factor modification.  .  #2 hypertension  Continue clonidine. Add PO lopressor, on norvasc   #3 chronic kidney disease  Stable. Creatinine currently at 3. Baseline creatinine approximately 2.5-3. Will discontinue Lasix and follow.   #4 peripheral vascular disease status post renal stent and AAA repair  Stable. Patient has been changed from aspirin to Plavix.    #5 depression  Stable.   #6 hyperlipidemia  Continue statin   #7 Hypokalemia  secondary to diureses. Replete.    Procedures: Echo: Left ventricle: The cavity size was normal. Wall thickness was increased in a pattern of moderate LVH. Systolic function was normal. The estimated ejection fraction was in the range of 60% to 65%. Although no diagnostic regional wall motion abnormality was identified, this possibility cannot be completely excluded on the basis of this study. - Pulmonary arteries: Systolic pressure was mildly increased. PA peak pressure: 38mm Hg (S).   Consultations:  neuro  Discharge Exam: Filed Vitals:   02/25/13 1753 02/25/13 2104 02/26/13 0144 02/26/13 0549  BP: 129/53 149/80 132/84 114/74  Pulse: 84 77 78 78  Temp: 98.4 F (36.9 C) 97.3 F (36.3 C) 97.3 F (36.3 C) 97.5 F (36.4 C)  TempSrc: Oral Oral Oral Oral  Resp: 18 18 16 18   Height:      Weight:      SpO2: 96% 94% 98% 97%    General: NAD, slurred speech Cardiovascular: rrr Respiratory: clear anterior  Discharge Instructions       Future Appointments Provider Department Dept Phone   04/01/2013 1:00 PM [facility]  [place] [facility] [place] [facility] AT [facility] [phone]   04/01/2013 1:30 PM [facility] [place] [facility] AT [facility] [phone]   04/01/2013 2:00 PM [facility] [place] [facility] AT [facility] [phone]       Medication List    STOP taking these medications       cloNIDine 0.1 MG tablet  Commonly known as:  CATAPRES      diphenhydrAMINE 25 mg capsule  Commonly known as:  BENADRYL     furosemide 40 MG tablet  Commonly known as:  LASIX     simvastatin 10 MG tablet  Commonly known as:  ZOCOR      TAKE these medications       acetaminophen 500 MG tablet  Commonly known as:  TYLENOL  Take 1,500 mg by mouth daily. pain     amLODipine 10 MG tablet  Commonly known as:  NORVASC  Take 10 mg by mouth See admin instructions. Takes 1 tablet every day at noon and a 2nd tablet 2-3 times week (random days) in the evening     atorvastatin 20 MG tablet  Commonly known as:  LIPITOR  Take 1 tablet (20 mg total) by mouth daily at 6 PM.     clopidogrel 75 MG tablet  Commonly known as:  PLAVIX  Take 1 tablet (75 mg total) by mouth daily with breakfast.     LORazepam 2 MG/ML injection  Commonly known as:  ATIVAN  Inject 0.25 mLs (0.5 mg total) into the vein 3 (three) times daily as needed for anxiety.     metoprolol 100 MG tablet  Commonly known as:  LOPRESSOR  Take 25 mg by mouth daily with lunch.     traZODone 50 MG tablet  Commonly known as:  DESYREL  Take 50-100 mg by mouth at bedtime as needed for sleep.       Allergies  Allergen Reactions  . Other Rash    "Cheerios cause me to break out--bumps all over my chest"  . Plaquenil (Hydroxychloroquine Sulfate) Rash    Severe rash   Follow-up Information   Follow up with Gates Rigg, MD. Schedule an appointment as soon as possible for a visit in 2 months. (stroke clinic)    Contact information:   [address] [place] [address] [phone]        The results of significant diagnostics from this hospitalization (including imaging, microbiology, ancillary and laboratory) are listed below for reference.    Significant Diagnostic Studies: Ct Head Wo Contrast  02/22/2013   *RADIOLOGY REPORT*  Clinical Data: Worsening right-sided weakness.  CT HEAD WITHOUT CONTRAST  Technique:  Contiguous axial images were obtained from the  base of the skull through the vertex without contrast.  Comparison: CT scan 02/20/2013 and MRI 02/21/2013.  Findings: Stable age related cerebral atrophy, ventriculomegaly and periventricular white matter disease.  There are remote lacunar type infarcts and a remote left occipital cortical infarct.  The left basal ganglia lacunar type infarct is again demonstrated.  No complicating features such as hemorrhage.  No findings for acute hemispheric infarction and no mass lesions.  The brainstem and cerebellum are stable.  Stable patchy sinus disease.  IMPRESSION:  1.  Stable age related cerebral atrophy, ventriculomegaly and periventricular white matter disease. 2.  Remote lacunar type basal ganglia infarcts and remote left occipital cortical infarct. 3.  Acute left basal ganglia lacunar type infarct without complicating features. 4.  No acute hemispheric infarction or intracranial  hemorrhage.   Original Report Authenticated By: Rudie Meyer, M.D.   Ct Head Wo Contrast  02/20/2013   *RADIOLOGY REPORT*  Clinical Data: Right-sided weakness  CT HEAD WITHOUT CONTRAST  Technique:  Contiguous axial images were obtained from the base of the skull through the vertex without contrast.  Comparison: Brain MRI, 06/05/2011  Findings: The ventricles are normal in configuration.  There is ventricular enlargement, greater than sulcal enlargement, consistent with a predominance of central volume loss.  There is no hydrocephalus.  There are no parenchymal masses or mass effect.  There is an old left anterior basal ganglia lacunar infarct and small old deep white matter lacunar infarcts stable from the prior brain MRI. There is a small left parietal lobe infarct. Patchy white matter hypoattenuation noted bilaterally is consistent with moderate chronic microvascular ischemic change.  There is no evidence of a recent infarct.  There are no extra-axial masses or abnormal fluid collections.  There is no intracranial hemorrhage.  The  visualized sinuses and mastoid air cells are clear.  IMPRESSION: No acute intracranial abnormalities.  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  Chronic microvascular ischemic change.   Original Report Authenticated By: Amie Portland, M.D.   Mr [facility] Wo Contrast  02/21/2013   *RADIOLOGY REPORT*  Clinical Data:  72 year old male with right side weakness, altered speech.  Comparison: Head CT without contrast 02/20/2013.  Brain MRI and MRA 06/05/2011.  MRI HEAD WITHOUT CONTRAST  Technique: Multiplanar, multiecho pulse sequences of the brain and surrounding structures were obtained according to standard protocol without intravenous contrast.  Findings: Study is degraded by motion artifact despite repeated imaging attempts.  Cerebral volume has not significantly changed.  10 mm focus of restricted diffusion in the left internal capsule posterior limb (series 3 image 13).  Mild associated T2 and FLAIR hyperintensity.  No mass effect or associated hemorrhage.  No other diffusion restriction. Major intracranial vascular flow voids are stable.  Chronic lacunar infarcts in both medial thalami are new since 2012. Otherwise stable moderately advanced chronic small vessel ischemia in and around the deep gray matter nuclei.  Small chronic left occipital lobe infarct is stable.  Multiple small chronic lacunar infarcts in both cerebellar hemispheres, better demonstrated on the previous study due to motion today.  No midline shift, ventriculomegaly, mass effect, evidence of mass lesion, extra-axial collection or acute intracranial hemorrhage. Cervicomedullary junction and pituitary are within normal limits. C3-C4 disc degeneration. Visualized bone marrow signal is within normal limits.  Interval postoperative changes to the right globe.  Otherwise stable orbit soft tissues.  Interval decreased paranasal sinus mucosal thickening.  Mastoids are clear.  IMPRESSION: 1.  Acute lacunar infarct left posterior limb internal capsule.  No mass  effect or hemorrhage. 2.  Underlying advanced chronic small and medium size vessel ischemia, with some progression since 2012. 3.  MRA findings are below.  MRA HEAD WITHOUT CONTRAST  Technique: Angiographic images of the Circle of Willis were obtained using MRA technique without  intravenous contrast.  Findings: Stable antegrade flow in the posterior circulation with dominant distal right vertebral artery, and the left vertebral functionally terminating PICA.  Normal right PICA.  Stable basilar artery without stenosis.  A somewhat unusual dual PCA supply from both the posterior communicating arteries and P1 segments (inferior and superior PCA divisions supplied separately) is re-identified.  Bilateral PCA branches are stable and within normal limits. No proximal left PCA irregularity to correspond to the acute MRI findings.  Stable antegrade flow in both ICA siphon.  Ophthalmic and posterior communicating artery origins are within normal limits.  Carotid termini are stable.  MCA origins are stable.  The left ACA A1 segment is dominant.  Small left lenticulostriate artery infundibulum arises from the A1 segment and is more apparent on today's MIP images, but stable.  Bilateral MCA branches are stable and within normal limits. Anterior communicating artery and bilateral ACA branches are stable and within normal limits.  IMPRESSION: Stable and negative intracranial MRA.   Original Report Authenticated By: Erskine Speed, M.D.   Mr Brain Wo Contrast  02/21/2013   *RADIOLOGY REPORT*  Clinical Data:  72 year old male with right side weakness, altered speech.  Comparison: Head CT without contrast 02/20/2013.  Brain MRI and MRA 06/05/2011.  MRI HEAD WITHOUT CONTRAST  Technique: Multiplanar, multiecho pulse sequences of the brain and surrounding structures were obtained according to standard protocol without intravenous contrast.  Findings: Study is degraded by motion artifact despite repeated imaging attempts.  Cerebral  volume has not significantly changed.  10 mm focus of restricted diffusion in the left internal capsule posterior limb (series 3 image 13).  Mild associated T2 and FLAIR hyperintensity.  No mass effect or associated hemorrhage.  No other diffusion restriction. Major intracranial vascular flow voids are stable.  Chronic lacunar infarcts in both medial thalami are new since 2012. Otherwise stable moderately advanced chronic small vessel ischemia in and around the deep gray matter nuclei.  Small chronic left occipital lobe infarct is stable.  Multiple small chronic lacunar infarcts in both cerebellar hemispheres, better demonstrated on the previous study due to motion today.  No midline shift, ventriculomegaly, mass effect, evidence of mass lesion, extra-axial collection or acute intracranial hemorrhage. Cervicomedullary junction and pituitary are within normal limits. C3-C4 disc degeneration. Visualized bone marrow signal is within normal limits.  Interval postoperative changes to the right globe.  Otherwise stable orbit soft tissues.  Interval decreased paranasal sinus mucosal thickening.  Mastoids are clear.  IMPRESSION: 1.  Acute lacunar infarct left posterior limb internal capsule.  No mass effect or hemorrhage. 2.  Underlying advanced chronic small and medium size vessel ischemia, with some progression since 2012. 3.  MRA findings are below.  MRA HEAD WITHOUT CONTRAST  Technique: Angiographic images of the Circle of Willis were obtained using MRA technique without  intravenous contrast.  Findings: Stable antegrade flow in the posterior circulation with dominant distal right vertebral artery, and the left vertebral functionally terminating PICA.  Normal right PICA.  Stable basilar artery without stenosis.  A somewhat unusual dual PCA supply from both the posterior communicating arteries and P1 segments (inferior and superior PCA divisions supplied separately) is re-identified.  Bilateral PCA branches are stable  and within normal limits. No proximal left PCA irregularity to correspond to the acute MRI findings.  Stable antegrade flow in both ICA siphon.  Ophthalmic and posterior communicating artery origins are within normal limits.  Carotid termini are stable.  MCA origins are stable.  The left ACA A1 segment is dominant.  Small left lenticulostriate artery infundibulum arises from the A1 segment and is more apparent on today's MIP images, but stable.  Bilateral MCA branches are stable and within normal limits. Anterior communicating artery and bilateral ACA branches are stable and within normal limits.  IMPRESSION: Stable and negative intracranial MRA.   Original Report Authenticated By: Erskine Speed, M.D.   Nm Pulmonary Perfusion  02/22/2013   *RADIOLOGY REPORT*  Clinical Data:  NUCLEAR MEDICINE - PERFUSION LUNG SCAN  Technique:  Perfusion images were obtained in multiple projections after intravenous injection of Tc-67m MAA.  Ventilation scans were not performed.  Radiopharmaceuticals:  3.0 mCi Tc-41m MAA.  Comparison:  Chest x-ray 02/21/2013  Findings: The perfusion lung scan demonstrates a few patchy perfusion defects.  No segmental or subsegmental defects are suggested to suggest pulmonary emboli.  IMPRESSION: Low probability perfusion lung scan for pulmonary embolism.   Original Report Authenticated By: Rudie Meyer, M.D.   Dg Chest Port 1 View  02/23/2013   *RADIOLOGY REPORT*  Clinical Data: Congestion and shortness of breath.  Possible aspiration.  PORTABLE CHEST - 1 VIEW  Comparison: 02/21/2013  Findings: Hyperinflation suggesting emphysema.  Central peribronchial thickening and interstitial changes likely due to chronic bronchitis.  Linear fibrosis or atelectasis in the right lung base.  Stable appearance since previous study.  No developing airspace disease or consolidation.  No blunting of costophrenic angles.  No pneumothorax.  Tortuous aorta.  IMPRESSION: Stable chronic emphysematous and bronchitic  changes in the chest. No evidence of active pulmonary disease.   Original Report Authenticated By: Burman Nieves, M.D.   Dg Chest Port 1 View  02/21/2013   *RADIOLOGY REPORT*  Clinical Data: Short of breath  PORTABLE CHEST - 1 VIEW  Comparison: None  Findings: The heart size and mediastinal contours are within normal limits.  Both lungs are clear.  The visualized skeletal structures are unremarkable.  IMPRESSION: No active cardiopulmonary abnormalities.   Original Report Authenticated By: Signa Kell, M.D.   Dg Swallowing Func-speech Pathology  02/25/2013   Kevin Escobar, CCC-SLP     02/25/2013 10:50 AM Objective Swallowing Evaluation: Modified Barium Swallowing Study   Patient Details  Name: Kevin Escobar MRN: [id number] Date of Birth: 03-28-1941  Today's Date: 02/25/2013 Time: 0910-0931 SLP Time Calculation (min): 21 min  Past Medical History:  Past Medical History  Diagnosis Date  . Cancer of bladder 2010  . Cancer of prostate 1994  . Depression   . Chronic kidney disease   . Hypertension   . TIA (transient ischemic attack) 2008  . Peripheral vascular disease   . PVD (peripheral vascular disease) 02/21/2013   Past Surgical History:  Past Surgical History  Procedure Laterality Date  . Abdominal aortic aneurysm repair  1995  . Prostate surgery  1994  . Colon surgery  2010   HPI:  Kevin Escobar is an 72 y.o. male with a past medical history  significant for HTN, TIA, CKD, renal artery stenosis s/p stent  placement, AAA repair in 1995, prostate and bladder cancer,  admitted to [facility] with new onset right hemiparesis and 2 episodes of  transient language impairment..  Pt  was seen by Speech  pathologist on Sunday with recommendations for dys2/thin,  overtnight he had episode of vomiting and "gurgling" after intake  of water, also with increased weakness, facial droop on right and  slurred speech.  MRI completed that showed left basal ganglia  cva.  BSE repeated yesterday with diet of puree/thin initiated,    MBS indicated to determine readiness for dietary advancement and  allow instrumental evaluation of swallow function.       Assessment / Plan / Recommendation Clinical Impression  Dysphagia Diagnosis: Moderate oral phase dysphagia;Moderate  pharyngeal phase dysphagia Clinical impression: Pt presents with moderate sensorimotor  oropharyngeal dysphagia from his cva that is exacerbated by  fatigue factor.  Weakness with oral manipulation results in  delayed transiting, decreased oral propulsion and stasis of  liquids right oral sulci *mixed with  secretions without  awareness.  Cues to "swallow fast and hard" were helpful to  expedite swallow but were fatiguing to pt as well.  Pharyngeal  deficits characterized by inconsisent delay in swallow initiation  and weakness resulting in AUDIBLE aspiration of thin liquids x2  during testing which he was unable to fully clear.  Chin tuck  posture, head turn right, and limited amount to tsp did not  prevent aspiration.  Pt did not aspirate or penetrate with nectar  but stasis present across consistencies due to weakness.  Cued  dry swallows helpful to decrease stasis but did not fully  eliminate them, but contribute to fatigue.    Suspect pt is having some aspiration of secretions based on MBS  results and decreased oral control.   Pt's level of dysphagia and  fatigue may impair his ability to consume adequate nutrition  without aspiration.  He is not appropriate for a dietary  advancement at this time.    Rec continue current diet with very strict precautions to  mitigate aspiration.  SLP used teach back with video to assure pt  understood testing results and recommendations as pt's following  of compensation strategies during po has been limited.  SLP found  cup of thin water on tray table in room today, water protocol   initiation on CIR may impove pt QOL and compliance.       Treatment Recommendation    CIR   Diet Recommendation Dysphagia 1 (Puree);Nectar-thick liquid   (single ice chips)   Liquid Administration via: Cup;Straw Medication Administration: Crushed with puree (crush if large and  not contraindicated) Supervision: Full supervision/cueing for compensatory  strategies;Patient able to self feed Compensations: Slow rate;Small sips/bites;Check for  pocketing;Follow solids with liquid;Effortful swallow;Multiple  dry swallows after each bite/sip (frequent rest breaks) Postural Changes and/or Swallow Maneuvers: Seated upright 90  degrees;Upright 30-60 min after meal    Other  Recommendations Oral Care Recommendations: Oral care BID Other Recommendations: Clarify dietary restrictions;Have oral  suction available   Follow Up Recommendations  Inpatient Rehab    Frequency and Duration min 2x/week  1 week   Pertinent Vitals/Pain Afebrile, congested cough    SLP Swallow Goals Patient will utilize recommended strategies during swallow to  increase swallowing safety with: Moderate assistance Swallow Study Goal #2 - Progress: Progressing toward goal   General HPI: Kevin Escobar is an 72 y.o. male with a past  medical history significant for HTN, TIA, CKD, renal artery  stenosis s/p stent placement, AAA repair in 1995, prostate and  bladder cancer, admitted to [facility] with new onset right hemiparesis  and 2 episodes of transient language impairment..  Pt  was seen  by Speech pathologist on Sunday with recommendations for  dys2/thin, overtnight he had episode of vomiting and "gurgling"  after intake of water, also with increased weakness, facial droop  on right and slurred speech.  MRI completed that showed left  basal ganglia cva.  BSE repeated yesterday with diet of  puree/thin initiated,  MBS indicated due to concern for dysphagia  even PTA.   Type of Study: Modified Barium Swallowing Study Reason for Referral: Objectively evaluate swallowing function Previous Swallow Assessment: BSE and MBS, MBS ceased in middle of  testing due to concern for medical change  Diet Prior to this Study:  Dysphagia 1 (puree);Nectar-thick  liquids (single ice chips) Temperature Spikes Noted: No Respiratory Status: Room air History of Recent Intubation: No Behavior/Cognition: Alert;Cooperative;Pleasant  mood;Impulsive;Requires cueing;Decreased sustained attention Oral Cavity -  Dentition: Adequate natural dentition Oral Motor / Sensory Function: Impaired - see Bedside swallow  eval Self-Feeding Abilities: Needs assist Patient Positioning: Upright in chair Baseline Vocal Quality: Breathy;Low vocal intensity Volitional Cough: Congested Volitional Swallow:  (did not elicit consistently) Anatomy: Within functional limits Pharyngeal Secretions: Standing secretions in (comment)  (oropharynx, removed with swallowing and oral suction)    Reason for Referral Objectively evaluate swallowing function   Oral Phase Oral Preparation/Oral Phase Oral Phase: Impaired Oral - Nectar Oral - Nectar Teaspoon: Reduced posterior  propulsion;Lingual/palatal residue;Delayed oral transit;Weak  lingual manipulation Oral - Nectar Cup: Reduced posterior propulsion;Lingual/palatal  residue;Lingual pumping;Weak lingual manipulation;Delayed oral  transit;Right anterior bolus loss Oral - Nectar Straw: Reduced posterior propulsion;Weak lingual  manipulation;Lingual/palatal residue;Delayed oral transit;Right  anterior bolus loss Oral - Thin Oral - Thin Teaspoon: Piecemeal swallowing;Lingual/palatal  residue;Reduced posterior propulsion;Delayed oral transit;Weak  lingual manipulation;Right anterior bolus loss;Holding of bolus Oral - Thin Cup: Right anterior bolus loss;Reduced posterior  propulsion;Holding of bolus Oral - Thin Straw: Right anterior bolus loss;Holding of  bolus;Lingual/palatal residue;Weak lingual manipulation;Delayed  oral transit Oral - Solids Oral - Puree: Piecemeal swallowing;Reduced posterior  propulsion;Lingual/palatal residue;Weak lingual  manipulation;Holding of bolus Oral - Regular: Impaired mastication;Reduced posterior   propulsion;Lingual/palatal residue;Weak lingual  manipulation;Delayed oral transit Oral Phase - Comment Oral Phase - Comment: inconsistent delay in oral transfer, pt  required verbal cues to transit quickly, anterior loss right side  without awareness   Pharyngeal Phase Pharyngeal Phase Pharyngeal Phase: Impaired Pharyngeal - Nectar Pharyngeal - Nectar Teaspoon: Reduced tongue base  retraction;Reduced pharyngeal peristalsis;Delayed swallow  initiation;Premature spillage to valleculae;Premature spillage to  pyriform sinuses Pharyngeal - Nectar Cup: Delayed swallow initiation;Premature  spillage to valleculae Pharyngeal - Nectar Straw: Delayed swallow initiation;Premature  spillage to pyriform sinuses;Premature spillage to valleculae Pharyngeal - Thin Pharyngeal - Thin Teaspoon: Delayed swallow initiation;Premature  spillage to pyriform sinuses;Penetration/Aspiration during  swallow Penetration/Aspiration details (thin teaspoon): Material enters  airway, passes BELOW cords and not ejected out despite cough  attempt by patient;Material enters airway, CONTACTS cords then  ejected out Pharyngeal - Thin Cup: Premature spillage to valleculae;Delayed  swallow initiation;Reduced pharyngeal peristalsis;Premature  spillage to pyriform sinuses;Reduced laryngeal elevation;Reduced  airway/laryngeal closure;Reduced tongue base  retraction;Penetration/Aspiration during swallow Penetration/Aspiration details (thin cup): Material enters  airway, passes BELOW cords and not ejected out despite cough  attempt by patient Pharyngeal - Thin Straw: Premature spillage to  valleculae;Premature spillage to pyriform sinuses;Compensatory  strategies attempted (Comment);Penetration/Aspiration during  swallow;Delayed swallow initiation Penetration/Aspiration details (thin straw): Material enters  airway, passes BELOW cords and not ejected out despite cough  attempt by patient Pharyngeal - Solids Pharyngeal - Puree: Premature spillage to  valleculae;Delayed  swallow initiation;Reduced pharyngeal peristalsis;Reduced  laryngeal elevation;Reduced airway/laryngeal closure;Reduced  tongue base retraction;Pharyngeal residue - valleculae;Pharyngeal  residue - pyriform sinuses Pharyngeal - Regular: Premature spillage to valleculae;Delayed  swallow initiation;Reduced tongue base retraction;Reduced  airway/laryngeal closure;Reduced laryngeal elevation;Reduced  pharyngeal peristalsis;Reduced epiglottic inversion;Reduced  anterior laryngeal mobility;Pharyngeal residue - valleculae Pharyngeal Phase - Comment Pharyngeal Comment: head turn right, chin tuck did not  consistently help pt, fatigue factor high as pt becomes quickly  short of breath with intake  Cervical Esophageal Phase    GO    Cervical Esophageal Phase Cervical Esophageal Phase: Impaired Cervical Esophageal Phase - Comment Cervical Esophageal Comment: pt appears with pyriform sinus  stasis of liquids that mixed with secretions, cued dry swallows  helped to decrease stasis but are fatiguing for pt        Kevin Burnet, MS [facility]  SLP [phone]    Dg Swallowing Func-speech Pathology  02/23/2013   Kevin Escobar, CCC-SLP     02/23/2013 10:26 AM Objective Swallowing Evaluation: Modified Barium Swallowing Study   Patient Details  Name: Kevin Escobar MRN: [id number] Date of Birth: [birth year]  Today's Date: 02/23/2013 Time: 1308-6578 SLP Time Calculation (min): 12 min  Past Medical History:  Past Medical History  Diagnosis Date  . Cancer of bladder 2010  . Cancer of prostate 1994  . Depression   . Chronic kidney disease   . Hypertension   . TIA (transient ischemic attack) 2008  . Peripheral vascular disease   . PVD (peripheral vascular disease) 02/21/2013   Past Surgical History:  Past Surgical History  Procedure Laterality Date  . Abdominal aortic aneurysm repair  1995  . Prostate surgery  1994  . Colon surgery  2010   HPI:  Kevin Escobar is an 72 y.o. male with a past medical history  significant for  HTN, TIA, CKD, renal artery stenosis s/p stent  placement, AAA repair in 1995, prostate and bladder cancer,  admitted to [facility] with new onset right hemiparesis and 2 episodes of  transient language impairment..  Pt  was seen by Speech  pathologist on Sunday with recommendations for dys2/thin,  overtnight he had episode of vomiting and "gurgling" after intake  of water, also with increased weakness, facial droop on right and  slurred speech.  MRI completed that showed left basal ganglia  cva.  BSE repeated yesterday with diet of puree/thin initiated,   MBS indicated due to concern for dysphagia even PTA.       Assessment / Plan / Recommendation Clinical Impression  Clinical impression: Limited assessment (tsp of nectar liquid  given only) secondary to pt demonstrating AMS immediately after  swallow of first bolus.  Pt had been fully alert, eyes open,  after first swallow he closed his eyes, was lethargic, and SOB;  he complained of "feeling woozy".  MBS was ceased at that time,  SLP phoned floor to inform nurse while xray techs transported pt  back to room.   Pt demonstrated mild delay in oral transiting and  pharyngeal swallow initiation at approx 1 second with nectar to  vallecular space.  Also mild residuals present at tongue base  without pt awareness.  Testing was dc'd at that time due to  medical concerns.   Pt was observed by SLP to be overtly coughing during breakfast  intake today most notably with thin liquids- suspect aspiration  due to overt coughing, swallow delay and residuals noted on MBS.   Rec pt diet be modified to mitigate aspiration risk puree/nectar.   Pt would benefit from repeat MBS within the next few days.     Treatment Recommendation  Therapy as outlined in treatment plan below    Diet Recommendation Dysphagia 1 (Puree);Nectar-thick liquid;Ice  chips PRN after oral care   Liquid Administration via: Cup;No straw Medication Administration: Whole meds with puree Supervision: Full  supervision/cueing for compensatory  strategies;Staff feed patient;Trained caregiver to feed patient Compensations: Slow rate;Small sips/bites;Multiple dry swallows  after each bite/sip;Hard cough after swallow;Clear throat  intermittently, check for oral pocketing on right Postural Changes and/or Swallow Maneuvers: Seated upright 90  degrees    Other  Recommendations Oral Care Recommendations: Oral care BID Other Recommendations: Clarify dietary restrictions   Follow Up Recommendations       Frequency and Duration min 2x/week  1 week   Pertinent Vitals/Pain Afebrile, decreased, congested  cough    SLP Swallow Goals Patient will utilize recommended strategies during swallow to  increase swallowing safety with: Minimal assistance Swallow Study Goal #2 - Progress: Progressing toward goal   General Date of Onset: 02/20/13 HPI: Kevin Escobar is an 72 y.o. male with a past medical  history significant for HTN, TIA, CKD, renal artery stenosis s/p  stent placement, AAA repair in 1995, prostate and bladder cancer,  admitted to [facility] with new onset right hemiparesis and 2 episodes of  transient language impairment..  Pt  was seen by Speech  pathologist on Sunday with recommendations for dys2/thin,  overtnight he had episode of vomiting and "gurgling" after intake  of water, also with increased weakness, facial droop on right and  slurred speech.  MRI completed that showed left basal ganglia  cva.  BSE repeated yesterday with diet of puree/thin initiated,   MBS indicated due to concern for dysphagia even PTA.   Type of Study: Modified Barium Swallowing Study Reason for Referral: Objectively evaluate swallowing function Diet Prior to this Study: Dysphagia 1 (puree);Thin liquids Respiratory Status: Room air History of Recent Intubation: No Behavior/Cognition: Alert;Cooperative;Distractible;Pleasant  mood;Requires cueing Oral Cavity - Dentition: Adequate natural dentition Oral Motor / Sensory Function: Impaired - see Bedside  swallow  eval Self-Feeding Abilities: Needs assist Patient Positioning: Upright in chair Baseline Vocal Quality: Clear Volitional Cough: Congested;Wet (pt has a "smoker's cough" per  spouse) Volitional Swallow: Able to elicit Anatomy: Within functional limits Pharyngeal Secretions: Not observed secondary MBS    Reason for Referral Objectively evaluate swallowing function   Oral Phase Oral Preparation/Oral Phase Oral Phase: Impaired Oral - Nectar Oral - Nectar Teaspoon: Reduced posterior propulsion;Weak lingual  manipulation   Pharyngeal Phase Pharyngeal Phase Pharyngeal Phase: Impaired Pharyngeal - Nectar Pharyngeal - Nectar Teaspoon: Delayed swallow  initiation;Premature spillage to valleculae;Pharyngeal residue -  valleculae  Cervical Esophageal Phase    GO    Cervical Esophageal Phase Cervical Esophageal Phase: WFL (for barium provided)        Kevin Burnet, MS [facility] SLP [phone]     Microbiology: No results found for this or any previous visit (from the past 240 hour(s)).   Labs: Basic Metabolic Panel:  Recent Labs Lab 02/20/13 1631 02/20/13 1654 02/21/13 0829 02/22/13 0540 02/22/13 0800 02/23/13 0555 02/24/13 0525  NA 137 142 139 138  --  143 144  K 3.2* 3.2* 3.3* 3.1*  --  4.0 3.8  CL 102 107 104 102  --  107 107  CO2 21  --  21 23  --  24 22  GLUCOSE 122* 118* 87 144*  --  116* 98  BUN 40* 39* 38* 37*  --  38* 40*  CREATININE 2.96* 2.70* 2.88* 2.71*  --  3.00* 2.90*  CALCIUM 9.5  --  9.7 10.2  --  10.2 10.1  MG  --   --   --   --  2.2  --   --    Liver Function Tests:  Recent Labs Lab 02/20/13 1631  AST 10  ALT 10  ALKPHOS 62  BILITOT 0.2*  PROT 6.5  ALBUMIN 3.2*   No results found for this basename: LIPASE, AMYLASE,  in the last 168 hours No results found for this basename: AMMONIA,  in the last 168 hours CBC:  Recent Labs Lab 02/20/13 1631 02/20/13 1654 02/23/13 1410 02/24/13 0525  WBC 7.5  --  15.4* 11.9*  NEUTROABS 4.2  --   --  8.7*  HGB  12.8* 12.9* 13.4  14.5  HCT 37.6* 38.0* 40.0 41.8  MCV 87.4  --  88.3 88.2  PLT 172  --  207 197   Cardiac Enzymes:  Recent Labs Lab 02/24/13 0025  TROPONINI <0.30   BNP: BNP (last 3 results) No results found for this basename: PROBNP,  in the last 8760 hours CBG:  Recent Labs Lab 02/25/13 0710 02/25/13 1146 02/25/13 1608 02/25/13 2101 02/26/13 0640  GLUCAP 100* 132* 121* 99 86       Signed:  Joani Cosma  [facility] 02/26/2013, 10:15 AM

## 2013-02-26 NOTE — Progress Notes (Signed)
Rehab admissions - I can admit to acute inpatient rehab today.  We do have bed available.  Call me for questions.  #[phone]

## 2013-02-27 ENCOUNTER — Inpatient Hospital Stay (HOSPITAL_COMMUNITY): Payer: No Typology Code available for payment source | Admitting: Occupational Therapy

## 2013-02-27 ENCOUNTER — Inpatient Hospital Stay (HOSPITAL_COMMUNITY): Payer: Medicare Other | Admitting: Physical Therapy

## 2013-02-27 ENCOUNTER — Inpatient Hospital Stay (HOSPITAL_COMMUNITY): Payer: Medicare Other | Admitting: Speech Pathology

## 2013-02-27 DIAGNOSIS — I69991 Dysphagia following unspecified cerebrovascular disease: Secondary | ICD-10-CM

## 2013-02-27 MED ORDER — LORAZEPAM 0.5 MG PO TABS
0.5000 mg | ORAL_TABLET | Freq: Every evening | ORAL | Status: DC | PRN
  Administered 2013-02-27 – 2013-03-01 (×3): 0.5 mg via ORAL
  Filled 2013-02-27 (×3): qty 1

## 2013-02-27 NOTE — Evaluation (Signed)
Physical Therapy Assessment and Plan  Patient Details  Name: Kevin Escobar MRN: [id number] Date of Birth: 10/04/1940  PT Diagnosis: Abnormal posture, Abnormality of gait, Hemiplegia dominant and Pain in generalized Right side Rehab Potential:   ELOS: 3-4 weeks    Today's Date: 02/27/2013 Time: 3086-5784 Time Calculation (min): 60 min  Problem List:  Patient Active Problem List   Diagnosis Date Noted  . Hypokalemia 02/22/2013  . Depression 02/21/2013  . PVD (peripheral vascular disease) 02/21/2013  . CVA (cerebral vascular accident) 02/20/2013  . Chronic kidney disease   . Hypertension   . TIA (transient ischemic attack)   . Peripheral vascular disease     Past Medical History:  Past Medical History  Diagnosis Date  . Cancer of bladder 2010  . Cancer of prostate 1994  . Depression   . Chronic kidney disease   . Hypertension   . TIA (transient ischemic attack) 2008  . Peripheral vascular disease   . PVD (peripheral vascular disease) 02/21/2013   Past Surgical History:  Past Surgical History  Procedure Laterality Date  . Abdominal aortic aneurysm repair  1995  . Prostate surgery  1994  . Colon surgery  2010    Assessment & Plan Clinical Impression: Kevin Escobar is a 72 y.o. male with a past medical history significant for HTN, TIA, CKD, lupus, renal artery stenosis s/p stent placement, AAA repair in 1995, prostate and bladder cancer; admitted on 02/21/13 with progressive difficulty walking due to right sided weakness and fall as well and 2 episodes of transient language impairment for >24 hours PTA. MRI/MRA brain with acute left posterior limb infarct and no IC stenosis. 2D echo with EF 60-65% and No wall abnormality. Carotid dopplers with mild soft plaque L-ICA origin. Patient with neurological worsening on 07/21 with vomiting/?aspiration event. He was placed on bedrest and follow up CCT stable.  Neurology recommends continuing plavix for thrombotic stroke due to  SVD and secondary stroke prevention. Patient has had intermittent episodes of SOB attributed to anxiety. Patient with evidence of dysphagia nd diet down graded. MBS done today revealing moderate sensorimotor oropharyngeal dysphagia exacerbated by fatigue factor as well as suspicion of aspiration of secretions. D1 diet with nectar liquids and single ice chips recommended due to aspiration of thins. Therapy ongoing and working on posture and pre-gait activities.  Patient transferred to CIR on 02/26/2013 .   Patient currently requires max with mobility secondary to abnormal tone and unbalanced muscle activation.  Prior to hospitalization, patient was independent  with mobility and lived with Spouse;Family in a House home.  Home access is 3Stairs to enter.  Patient will benefit from skilled PT intervention to maximize safe functional mobility, minimize fall risk and decrease caregiver burden for planned discharge home with 24 hour assist.  Anticipate patient will benefit from follow up [facility] at discharge.  PT - End of Session Activity Tolerance: Tolerates 30+ min activity with multiple rests Endurance Deficit: Yes PT Assessment Rehab Potential: Good Barriers to Discharge: Inaccessible home environment PT Plan PT Intensity: Minimum of 1-2 x/day ,45 to 90 minutes PT Frequency: 5 out of 7 days PT Duration Estimated Length of Stay: 2-3 weeks PT Treatment/Interventions: Ambulation/gait training;Balance/vestibular training;DME/adaptive equipment instruction;Functional mobility training;Neuromuscular re-education;Pain management;Patient/family education;Stair training;Therapeutic Activities;Therapeutic Exercise;UE/LE Strength taining/ROM;UE/LE Coordination activities;Wheelchair propulsion/positioning PT Recommendation Follow Up Recommendations: Home health PT Patient destination: Home  Skilled Therapeutic Intervention   PT Evaluation Precautions/Restrictions Precautions Precautions:  Fall Restrictions Weight Bearing Restrictions: No General Chart Reviewed: Yes Family/Caregiver Present: No  Pain Pain Assessment Pain Assessment: 0-10 Pain Score: 4  Pain Type: Acute pain Pain Location:  (gerneralized entire right side) Pain Orientation: Right Pain Descriptors / Indicators: Sore Patients Stated Pain Goal: 2 Pain Intervention(s): Medication (See eMAR);Repositioned Home Living/Prior Functioning Home Living Available Help at Discharge: Available 24 hours/day Type of Home: House Home Access: Stairs to enter [employer] of Steps: 3 Home Layout: Multi-level Alternate Level Stairs-Number of Steps: 3 Alternate Level Stairs-Rails: Right  Lives With: Spouse;Family (mildly disabled daughter stays with them 3 to 4 nights/week) Prior Function Level of Independence: Independent with basic ADLs;Independent with gait  Able to Take Stairs?: Yes Driving: Yes Vocation: Retired Optometrist - History Baseline Vision: Wears glasses only for reading Patient Visual Report: No change from baseline Vision - Assessment Eye Alignment: Within Functional Limits  Cognition Orientation Level: Oriented X4 Sensation Sensation Light Touch: Appears Intact Coordination Gross Motor Movements are Fluid and Coordinated: No (R Hemiplegia) Heel Shin Test: able to perform L heel on R shin with mild impairment on placement Motor  Motor Motor: Hemiplegia (Right hand doiminant with R sided weakness)  Mobility Bed Mobility Rolling Right: 4: Min assist;With rail Rolling Left: 1: +1 Total assist Left Sidelying to Sit: 3: Mod assist Sitting - Scoot to Edge of Bed: 3: Mod assist Sit to Supine: 3: Mod assist;HOB flat Transfers Sit to Stand: 2: Max assist (blocking Right knee) Stand to Sit: 2: Max assist (blocking right knee for controlled descent) Stand Pivot Transfers: 2: Max assist (blocking right knee and controlling trunk at R scapula) Locomotion   Ambulation Ambulation/Gait Assistance: Not tested (comment) Gait Gait: No Stairs / Additional Locomotion Stairs: No Wheelchair Mobility Wheelchair Mobility: No  Trunk/Postural Assessment  Thoracic Assessment Thoracic Assessment: Exceptions to [facility] (Right side bent in sitting) Lumbar Assessment Lumbar Assessment: Exceptions to [facility] (lumbar kyphosis in sitting) Postural Control Postural Control: Deficits on evaluation (trunk tends to lean right)  Balance Balance Balance Assessed: Yes Static Sitting Balance Static Sitting - Balance Support: Left upper extremity supported Static Sitting - Level of Assistance: 4: Min assist Static Sitting - Comment/# of Minutes: sat EOB x 10 minutes, facilitation for midline orientation/re-orientation and trunk extension, when L UE not supported will lean off to right side with hypotonic trunk and difficulty correcting using trunk musculature without using L UE on bed rail. Dynamic Sitting Balance Dynamic Sitting - Balance Support: Feet supported;Left upper extremity supported Dynamic Sitting - Level of Assistance: 4: Min assist Dynamic Sitting - Balance Activities: Lateral lean/weight shifting;Forward lean/weight shifting Static Standing Balance Static Standing - Level of Assistance: 2: Max assist (R knee blocked into ext and R trunk controlled at scapula) Extremity Assessment  RUE Assessment RUE Assessment: Exceptions to [facility] RUE PROM (degrees) Overall PROM Right Upper Extremity: Within functional limits for tasks performed RUE Overall PROM Comments: in gravity elminated plane can demonstrate some shoulder elevated  RUE Strength RUE Overall Strength: Deficits (0/5) RUE Overall Strength Comments: Brunstrom Level I RUE Tone RUE Tone: Flaccid LUE Assessment LUE Assessment: Within Functional Limits RLE Assessment RLE Assessment: Exceptions to [facility] (flaccid R LE with 1+ R LE leg press) LLE Assessment LLE Assessment: Within Functional Limits  FIM:   FIM - Bed/Chair Transfer Bed/Chair Transfer: 2: Supine > Sit: Max A (lifting assist/Pt. 25-49%);2: Sit > Supine: Max A (lifting assist/Pt. 25-49%);2: Bed > Chair or W/C: Max A (lift and lower assist);2: Chair or W/C > Bed: Max A (lift and lower assist) FIM - Locomotion: Wheelchair Locomotion: Wheelchair: 0:  Activity did not occur FIM - Locomotion: Ambulation Ambulation/Gait Assistance: Not tested (comment) FIM - Locomotion: Stairs Locomotion: Stairs: 0: Activity did not occur   Refer to Care Plan for Long Term Goals  Recommendations for other services: None  Discharge Criteria: Patient will be discharged from PT if patient refuses treatment 3 consecutive times without medical reason, if treatment goals not met, if there is a change in medical status, if patient makes no progress towards goals or if patient is discharged from hospital.  The above assessment, treatment plan, treatment alternatives and goals were discussed and mutually agreed upon: by patient  Rex Kras 02/27/2013, 3:06 PM

## 2013-02-27 NOTE — Evaluation (Signed)
Speech Language Pathology Assessment and Plan  Patient Details  Name: Kevin Escobar MRN: [id number] Date of Birth: [birth year]  SLP Diagnosis: Dysarthria;Dysphagia;Cognitive Impairments;Speech and Language deficits  Rehab Potential: Good ELOS: 2-3 weeks.   Today's Date: 02/27/2013 Time: 3664-4034 Time Calculation (min): 45 min  Problem List:  Patient Active Problem List   Diagnosis Date Noted  . Hypokalemia 02/22/2013  . Depression 02/21/2013  . PVD (peripheral vascular disease) 02/21/2013  . CVA (cerebral vascular accident) 02/20/2013  . Chronic kidney disease   . Hypertension   . TIA (transient ischemic attack)   . Peripheral vascular disease    Past Medical History:  Past Medical History  Diagnosis Date  . Cancer of bladder 2010  . Cancer of prostate 1994  . Depression   . Chronic kidney disease   . Hypertension   . TIA (transient ischemic attack) 2008  . Peripheral vascular disease   . PVD (peripheral vascular disease) 02/21/2013   Past Surgical History:  Past Surgical History  Procedure Laterality Date  . Abdominal aortic aneurysm repair  1995  . Prostate surgery  1994  . Colon surgery  2010    Assessment / Plan / Recommendation Clinical Impression  The patient is a 72year-old male with a past medical history significant for HTN, TIA, CKD, lupus, renal artery stenosis s/p stent placement, AAA repair in 1995, prostate and bladder cancer; admitted on 02/21/13, with progressive difficulty walking due to right sided weakness and fall as well and 2 episodes of transient language impairment for >24 hours PTA. MRI/MRA brain with acute left posterior limb infarct and no IC stenosis. Carotid Dopplers with mild soft plaque L-ICA origin. Patient with neurological worsening on 02/22/13, with vomiting/?aspiration event. He was placed on bedrest and follow-up CCT stable. Patient with evidence of dysphagia and diet downgraded. MBS done today revealing moderate sensorimotor  oropharyngeal dysphagia exacerbated by fatigue factor as well as suspicion of aspiration of secretions. D1 diet with nectar liquids and single ice chips recommended due to aspiration of thins. The patient is now recommended for comprehensive inpatient rehabilitation, and Speech Therapy evaluation was completed. The patient overall did well, showing no deficits in receptive language skills.  Expressive language evaluation showed severe dysarthria, with breathy voice and severely decreased vocal intensity, making most of his expressive language unintelligible.  He had no difficulty with verbal problem solving tasks or conceptualization/visualization tasks.  He was alert and oriented x 4.  He did, however, demonstrate difficulty maintaining his attention to tasks when environment became noisy. As stated above, the patient had a MBS study which showed moderately severe oropharyngeal dysphagia which placed him on restricted consistency diet.  It is therefore felt that patient will need therapeutic intervention to return expressive language skills to baseline, increase concentration skills so that he can successful in a variety of environments, and to decrease aspiration risk, so that he may return a lesser restrictive diet regimen.    SLP Assessment  Patient will need skilled Speech Lanaguage Pathology Services during CIR admission    Recommendations  Diet Recommendations: Dysphagia 1 (Puree);Nectar-thick liquid Liquid Administration via: Cup;Straw Medication Administration: Crushed with puree Supervision: Full supervision/cueing for compensatory strategies Compensations: Slow rate;Small sips/bites;Check for pocketing;Follow solids with liquid Postural Changes and/or Swallow Maneuvers: Seated upright 90 degrees;Upright 30-60 min after meal Oral Care Recommendations: Oral care BID Recommendations for Other Services: Neuropsych consult Patient destination: Skilled Nursing Facility (SNF) Follow up  Recommendations: Home Health SLP    SLP Frequency 5 out of 7 days  SLP Treatment/Interventions Cognitive remediation/compensation;Oral motor exercises;Patient/family education;Speech/Language facilitation;Therapeutic Activities;Therapeutic Exercise    Pain Pain Assessment Pain Assessment: 0-10 Pain Score: 5  Pain Type: Acute pain Pain Location: Hip Pain Orientation: Right Multiple Pain Sites: No Prior Functioning    Short Term Goals: Week 1: SLP Short Term Goal 1 (Week 1): The patient will perform oral motor exercises to increase speech intelligibility and decrease aspiration risk, with min A multimodal cueing.  SLP Short Term Goal 2 (Week 1): Patient will perform pharyngeal strengthening exercises to decrease aspiration risk, with min A multimodal cueing. SLP Short Term Goal 3 (Week 1): Patient will perform diaphragmatic exercises to increase vocal intensity, with min A multimodal cueing.  SLP Short Term Goal 4 (Week 1): Patient will demonstrate sustained attention to functional tasks, in noisy environment, with supervision verbal cues for redirection, in order to increase independence in his environment.  SLP Short Term Goal 5 (Week 1): Patient will utilize compensatory strategies to increase speech intelligibility with min A verbal cues.    See FIM for current functional status Refer to Care Plan for Long Term Goals  Recommendations for other services: Neuropsych  Discharge Criteria: Patient will be discharged from SLP if patient refuses treatment 3 consecutive times without medical reason, if treatment goals not met, if there is a change in medical status, if patient makes no progress towards goals or if patient is discharged from hospital.  The above assessment, treatment plan, treatment alternatives and goals were discussed and mutually agreed upon: by patient  Lenny Pastel 02/27/2013, 5:20 PM

## 2013-02-27 NOTE — Plan of Care (Signed)
Problem: RH BOWEL ELIMINATION Goal: RH STG MANAGE BOWEL WITH ASSISTANCE STG Manage Bowel with min Assistance.  Outcome: Not Progressing Laxative to be given; last void 7/21.  Problem: RH BLADDER ELIMINATION Goal: RH STG MANAGE BLADDER WITH ASSISTANCE STG Manage Bladder With max Assistance timed toileting  Outcome: Progressing Condom cath for ongoing incontinence.

## 2013-02-27 NOTE — Evaluation (Signed)
Occupational Therapy Assessment and Plan  Patient Details  Name: Kevin Escobar MRN: [id number] Date of Birth: 11-27-1940  OT Diagnosis: apraxia, hemiplegia affecting dominant side and muscle weakness (generalized) Rehab Potential: Rehab Potential: Good ELOS: 3-4 weeks   Today's Date: 02/27/2013 Time: 4132-4401 Time Calculation (min): 55 min  Problem List:  Patient Active Problem List   Diagnosis Date Noted  . Hypokalemia 02/22/2013  . Depression 02/21/2013  . PVD (peripheral vascular disease) 02/21/2013  . CVA (cerebral vascular accident) 02/20/2013  . Chronic kidney disease   . Hypertension   . TIA (transient ischemic attack)   . Peripheral vascular disease     Past Medical History:  Past Medical History  Diagnosis Date  . Cancer of bladder 2010  . Cancer of prostate 1994  . Depression   . Chronic kidney disease   . Hypertension   . TIA (transient ischemic attack) 2008  . Peripheral vascular disease   . PVD (peripheral vascular disease) 02/21/2013   Past Surgical History:  Past Surgical History  Procedure Laterality Date  . Abdominal aortic aneurysm repair  1995  . Prostate surgery  1994  . Colon surgery  2010    Assessment & Plan Clinical Impression: Patient is a 72 y.o. year old male with a past medical history significant for HTN, TIA, CKD, lupus, renal artery stenosis s/p stent placement, AAA repair in 1995, prostate and bladder cancer; admitted on 02/21/13 with progressive difficulty walking due to right sided weakness and fall as well and 2 episodes of transient language impairment for >24 hours PTA. MRI/MRA brain with acute left posterior limb infarct and no IC stenosis. 2D echo with EF 60-65% and No wall abnormality. Carotid dopplers with mild soft plaque L-ICA origin. Patient with neurological worsening on 07/21 with vomiting/?aspiration event. He was placed on bedrest and follow up CCT stable.  Neurology recommends continuing plavix for thrombotic stroke  due to SVD and secondary stroke prevention. Patient has had intermittent episodes of SOB attributed to anxiety. Patient with evidence of dysphagia nd diet down graded. MBS done today revealing moderate sensorimotor oropharyngeal dysphagia exacerbated by fatigue factor as well as suspicion of aspiration of secretions. D1 diet with nectar liquids and single ice chips recommended due to aspiration of thins. Therapy ongoing and working on posture and pre-gait activities.  Patient transferred to CIR on 02/26/2013 .    Patient currently requires max to total A with basic self-care skills and basic mobility secondary to muscle weakness, decreased cardiorespiratoy endurance, impaired timing and sequencing, unbalanced muscle activation, decreased coordination and decreased motor planning, decreased visual motor skills, decreased midline orientation, decreased attention, decreased awareness, decreased problem solving, decreased safety awareness, decreased memory and delayed processing and decreased sitting balance, decreased standing balance, decreased postural control, hemiplegia, decreased balance strategies and difficulty maintaining precautions.  Prior to hospitalization, patient could complete ADLs and mobility with independent .  Patient will benefit from skilled intervention to decrease level of assist with basic self-care skills and increase independence with basic self-care skills prior to discharge home with care partner.  Anticipate patient will require 24 hour supervision and minimal physical assistance and follow up home health and follow up outpatient.  OT - End of Session Activity Tolerance: Tolerates 10 - 20 min activity with multiple rests Endurance Deficit: Yes Endurance Deficit Description: Pt reports little sleep and required rest breaks and; requests to get back into bed at end of session OT Assessment Rehab Potential: Good OT Plan OT Intensity: Minimum of 1-2 x/day,  45 to 90 minutes OT  Frequency: 5 out of 7 days OT Duration/Estimated Length of Stay: 3-4 weeks OT Treatment/Interventions: Balance/vestibular training;Cognitive remediation/compensation;Community reintegration;Discharge planning;DME/adaptive equipment instruction;Disease mangement/prevention;Functional mobility training;Functional electrical stimulation;Neuromuscular re-education;Pain management;Psychosocial support;Patient/family education;Self Care/advanced ADL retraining;Splinting/orthotics;Therapeutic Activities;Skin care/wound managment;UE/LE Strength taining/ROM;Visual/perceptual remediation/compensation;UE/LE Coordination activities;Therapeutic Exercise;Wheelchair propulsion/positioning OT Recommendation Patient destination: Home Follow Up Recommendations: Home health OT;Outpatient OT   Skilled Therapeutic Intervention   OT Evaluation Precautions/Restrictions  Precautions Precautions: Fall Precaution Comments: right hemiparesis  Restrictions Weight Bearing Restrictions: No General Chart Reviewed: Yes Family/Caregiver Present: No    Pain Pain Assessment Pain Assessment: No/denies pain Pain Score: 4  Pain Type: Acute pain Pain Location:  (gerneralized entire right side) Pain Orientation: Right Pain Descriptors / Indicators: Sore Patients Stated Pain Goal: 2 Pain Intervention(s): Medication (See eMAR);Repositioned Home Living/Prior Functioning Home Living Available Help at Discharge: Available 24 hours/day Type of Home: House Home Access: Stairs to enter [employer] of Steps: 3 Entrance Stairs-Rails: Right Home Layout: Multi-level Alternate Level Stairs-Number of Steps: 3 Alternate Level Stairs-Rails: Right  Lives With: Spouse;Family Prior Function Level of Independence: Independent with basic ADLs;Independent with gait  Able to Take Stairs?: Yes Driving: Yes Vocation: Retired ADL  see FIM Vision/Perception  Vision - History Baseline Vision: Wears glasses only for  reading Visual History: Cataracts;Corrective eye surgery Patient Visual Report: No change from baseline Vision - Assessment Eye Alignment: Impaired (comment) Vision Assessment: Vision tested Tracking/Visual Pursuits: Decreased smoothness of horizontal tracking;Decreased smoothness of vertical tracking Saccades: Additional eye shifts occurred during testing;Decreased speed of saccadic movement Convergence: Impaired (comment) Additional Comments: Pt reports at night prior to the stroke he would have double vision Praxis Praxis: Impaired Praxis Impairment Details: Motor planning  Cognition Arousal/Alertness: Awake/alert Orientation Level: Oriented X4 Attention: Selective Sustained Attention: Appears intact Selective Attention: Impaired Selective Attention Impairment: Functional basic Awareness: Impaired Awareness Impairment: Emergent impairment Comments: Pt very fatigued in eval session so did not see impulsive behaviors Sensation Sensation Light Touch: Impaired Detail Light Touch Impaired Details: Impaired RUE Proprioception: Impaired Detail Proprioception Impaired Details: Impaired RUE;Impaired RLE Coordination Gross Motor Movements are Fluid and Coordinated: No Fine Motor Movements are Fluid and Coordinated: No Heel Shin Test: able to perform L heel on R shin with mild impairment on placement Motor  Motor Motor: Hemiplegia;Motor apraxia;Abnormal postural alignment and control Mobility  Bed Mobility Rolling Right: 4: Min assist;With rail Rolling Left: 1: +1 Total assist Left Sidelying to Sit: 3: Mod assist Sitting - Scoot to Edge of Bed: 3: Mod assist Sit to Supine: 2: Max assist Transfers Sit to Stand: 2: Max assist Stand to Sit: 2: Max assist  Trunk/Postural Assessment  Cervical Assessment Cervical Assessment: Within Functional Limits Thoracic Assessment Thoracic Assessment: Exceptions to [facility] Lumbar Assessment Lumbar Assessment: Exceptions to [facility] Postural  Control Postural Control: Deficits on evaluation Trunk Control: requires UE support or A due to weakness and fatigue Righting Reactions: delayed Protective Responses: delayed  Balance Balance Balance Assessed: Yes Static Sitting Balance Static Sitting - Balance Support: Left upper extremity supported Static Sitting - Level of Assistance: 4: Min assist Static Sitting - Comment/# of Minutes: sat EOB x 10 minutes, facilitation for midline orientation/re-orientation and trunk extension, when L UE not supported will lean off to right side with hypotonic trunk and difficulty correcting using trunk musculature without using L UE on bed rail. Dynamic Sitting Balance Dynamic Sitting - Balance Support: Feet supported;Left upper extremity supported Dynamic Sitting - Level of Assistance: 4: Min assist Dynamic Sitting - Balance Activities: Lateral lean/weight  shifting;Forward lean/weight shifting Static Standing Balance Static Standing - Balance Support: Left upper extremity supported Static Standing - Level of Assistance: 2: Max assist Static Standing - Comment/# of Minutes: for clothing management and hygiene at the sink Extremity/Trunk Assessment RUE Assessment RUE Assessment: Exceptions to [facility] RUE PROM (degrees) Overall PROM Right Upper Extremity: Within functional limits for tasks performed RUE Overall PROM Comments: in gravity elminated plane can demonstrate some shoulder elevated  RUE Strength RUE Overall Strength: Deficits (0/5) RUE Overall Strength Comments: Brunstrom Level I RUE Tone RUE Tone: Flaccid LUE Assessment LUE Assessment: Within Functional Limits  FIM:  FIM - Grooming Grooming Steps: Wash, rinse, dry face;Wash, rinse, dry hands;Oral care, brush teeth, clean dentures;Shave or apply make-up Grooming: 2: Patient completes 1 of 4 or 2 of 5 steps FIM - Bathing Bathing Steps Patient Completed: Chest;Right Arm;Abdomen;Front perineal area;Right upper leg;Left upper leg Bathing:  3: Mod-Patient completes 5-7 79f 10 parts or 50-74% FIM - Upper Body Dressing/Undressing Upper body dressing/undressing: 1: Total-Patient completed less than 25% of tasks FIM - Lower Body Dressing/Undressing Lower body dressing/undressing: 1: Total-Patient completed less than 25% of tasks FIM - Bed/Chair Transfer Bed/Chair Transfer: 2: Sit > Supine: Max A (lifting assist/Pt. 25-49%);2: Bed > Chair or W/C: Max A (lift and lower assist);2: Chair or W/C > Bed: Max A (lift and lower assist)   Refer to Care Plan for Long Term Goals  Recommendations for other services: None  Discharge Criteria: Patient will be discharged from OT if patient refuses treatment 3 consecutive times without medical reason, if treatment goals not met, if there is a change in medical status, if patient makes no progress towards goals or if patient is discharged from hospital.  The above assessment, treatment plan, treatment alternatives and goals were discussed and mutually agreed upon: by patient  1:1 OT eval initiated with OT goals, purpose and role discussed. Pt's daughter in the room when OT arrived but left for the day before therapy started.  Self care retraining at sink with focus on transfer from recliner to w/c, management of right UE and LE, introduction of hemi dressing techniques, sit to stands (2x) with visual feedback in the mirror for upright posture at midline, functional communication, simple problem solving, and making needs known. Pt requested to return to bed at end of session, before lunch, due to increased fatigue and wanting to sleep. Reported he did not sleep well last night. Max A transfer w/c to bed and max A to return to supine. Pt asleep before therapist left the room.  Roney Mans [facility] 02/27/2013, 11:39 AM

## 2013-02-27 NOTE — Progress Notes (Signed)
Physical Therapy Session Note  Patient Details  Name: Kevin Escobar MRN: [id number] Date of Birth: 06-28-1941  Today's Date: 02/27/2013 Time: 1530-1600 Time Calculation (min): 30 min  Short Term Goals: Week 1:  PT Short Term Goal 1 (Week 1): Patient will be able to perform bed mobility with Moderate assist. PT Short Term Goal 2 (Week 1): Patient will be able to perform transfers with Moderate assist. PT Short Term Goal 3 (Week 1): Patient will be able to perform gait using LRAD x 10' with Moderate assist x 2. PT Short Term Goal 4 (Week 1): Patient will be able to perform static standing balance with minimum assist. PT Short Term Goal 5 (Week 1): Patient will be able to perform dynamic standing balance with Moderate assist.  Therapy Documentation Precautions:  Precautions Precautions: Fall Precaution Comments: right hemiparesis  Restrictions Weight Bearing Restrictions: No Pain:denies pain  Therapeutic Activity:(15') Bed mobility with Mod/max-Assist and sitting EOB statically with L UE support with S/min-guard. Transfer sit<->stand Max-A with blocking of right knee. Stand-pivot transfer bed<->w/c Max-assist with special attention to blocking of right knee. W/c management:(15') locomotion using L UE and L LE 2 x 50' with min-Assist, verbal, tactile and visual cues (demonstration).  See FIM for current functional status  Therapy/Group: Individual Therapy  Rex Kras 02/27/2013, 3:34 PM

## 2013-02-27 NOTE — Progress Notes (Signed)
Patient ID: Kevin Escobar, male   DOB: 1940-10-07, 72 y.o.   MRN: [id number] Subjective/Complaints: 72 y.o. male with a past medical history significant for HTN, TIA, CKD, lupus, renal artery stenosis s/p stent placement, AAA repair in 1995, prostate and bladder cancer; admitted on 02/21/13 with progressive difficulty walking due to right sided weakness and fall as well and 2 episodes of transient language impairment for >24 hours PTA. MRI/MRA brain with acute left posterior limb infarct and no IC stenosis. 2D echo with EF 60-65% and No wall abnormality. Carotid dopplers with mild soft plaque L-ICA origin. Patient with neurological worsening on 07/21  Wants to go home because daughter is visiting from [place] ROS Negative except for weakness on the right side, remainder of systems normal  Objective: Vital Signs: Blood pressure 149/92, pulse 87, temperature 97.6 F (36.4 C), temperature source Oral, resp. rate 17, weight 59.24 kg (130 lb 9.6 oz), SpO2 93.00%. Dg Swallowing Func-speech Pathology    Results for orders placed during the hospital encounter of 02/20/13 (from the past 72 hour(s))  GLUCOSE, CAPILLARY     Status: Abnormal   Collection Time    02/24/13 12:03 PM      Result Value Range   Glucose-Capillary 116 (*) 70 - 99 mg/dL  GLUCOSE, CAPILLARY     Status: Abnormal   Collection Time    02/24/13  4:20 PM      Result Value Range   Glucose-Capillary 117 (*) 70 - 99 mg/dL  GLUCOSE, CAPILLARY     Status: Abnormal   Collection Time    02/24/13  9:28 PM      Result Value Range   Glucose-Capillary 100 (*) 70 - 99 mg/dL   Comment 1 Documented in Chart     Comment 2 Notify RN    GLUCOSE, CAPILLARY     Status: Abnormal   Collection Time    02/25/13  7:10 AM      Result Value Range   Glucose-Capillary 100 (*) 70 - 99 mg/dL   Comment 1 Documented in Chart     Comment 2 Notify RN    GLUCOSE, CAPILLARY     Status: Abnormal   Collection Time    02/25/13 11:46 AM      Result Value  Range   Glucose-Capillary 132 (*) 70 - 99 mg/dL  GLUCOSE, CAPILLARY     Status: Abnormal   Collection Time    02/25/13  4:08 PM      Result Value Range   Glucose-Capillary 121 (*) 70 - 99 mg/dL  GLUCOSE, CAPILLARY     Status: None   Collection Time    02/25/13  9:01 PM      Result Value Range   Glucose-Capillary 99  70 - 99 mg/dL   Comment 1 Notify RN     Comment 2 Documented in Chart    GLUCOSE, CAPILLARY     Status: None   Collection Time    02/26/13  6:40 AM      Result Value Range   Glucose-Capillary 86  70 - 99 mg/dL   Comment 1 Documented in Chart     Comment 2 Notify RN    GLUCOSE, CAPILLARY     Status: Abnormal   Collection Time    02/26/13 11:20 AM      Result Value Range   Glucose-Capillary 101 (*) 70 - 99 mg/dL  GLUCOSE, CAPILLARY     Status: Abnormal   Collection Time    02/26/13  4:31  PM      Result Value Range   Glucose-Capillary 146 (*) 70 - 99 mg/dL     HEENT: Eyes not injected, oral mucosa moist, external ears normal Cardio: RRR and No murmur Resp: CTA B/L and unlabored GI: BS positive and Nontender Extremity:  Pulses positive and No Edema Skin:   Intact Neuro: Alert/Oriented, Flat, Cranial Nerve Abnormalities Right central 7, Normal Sensory, Abnormal Motor 0/5 in the right upper and right lower limb, Tone:  Hypotonia and Dysarthric Musc/Skel:  Normal General no acute distress   Assessment/Plan: 1. 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 Physiatrist is providing close team supervision and 24 hour management of active medical problems listed below. Physiatrist and rehab team continue to assess barriers to discharge/monitor patient progress toward functional and medical goals. FIM:                   Comprehension Comprehension Mode: Auditory Comprehension: 5-Follows basic conversation/direction: With no  assist  Expression Expression Mode: Verbal Expression: 5-Expresses basic needs/ideas: With no assist            Medical Problem List and Plan:  1. DVT Prophylaxis/Anticoagulation: Pharmaceutical: Lovenox  2. Pain Management: N/A  3. Mood: Provide anxiety for support. Has a supportive family. Will add nicotine patch per family request (used 1 1/2-2PPD) LCSW to follow for evaluation and support. Provide ego support.  4. Neuropsych: This patient is capable of making decisions on his own behalf.  5. HTN: will monitor with bid checks. Continue metoprolol, norvasc and catapres. Off lasix due to renal insufficiency/dysphagia diet.  6. Reactive leucocytosis: Resolving.  7. Dyslipidemia: Continue lipitor.  8. Dysphagia: continue D1, nectar liquids. Continue to educate patient and family on compliance of current restrictions and aspiration risk. Add nocturnal fluid for hydration as appears dry.  9. Prostate cancer/bladder CA: H/o frequency. Toilet every 2-3 hours to help with continence. Condom cath at night.  10. CKD: Will monitor with routine checks. Currently at baseline.   LOS (Days) 1 A FACE TO FACE EVALUATION WAS PERFORMED  KIRSTEINS,ANDREW E 02/27/2013, 7:34 AM

## 2013-02-28 ENCOUNTER — Inpatient Hospital Stay (HOSPITAL_COMMUNITY): Payer: Medicare Other | Admitting: Physical Therapy

## 2013-02-28 NOTE — Progress Notes (Signed)
Physical Therapy Note  Patient Details  Name: Kevin Escobar MRN: [id number] Date of Birth: [birth year] Today's Date: 02/28/2013  [id number] (55 minutes) individual Pain: no pain reported Focus of treatment: Neuro re-ed RT LE; gait training to facilitate Rt knee extension control Treatment: Pt in bed upon arrival; max assist to donn pants in supine ; rolling left or right with tactile cues to use bedrail /mod assist ; supine to side to sit max assist from right; sitting edgfe of bed - pt requires max assist to maintain sitting balance initially; transfers - squat/pivot mod/max assist ; Neuro re-ed RT LE - AA sidelying (gravity eliminated) hip flexion using mirror as visual cue- pt able to intermittently initiate minimal active hip flexion; gait using rail left 20 feet X 2 mod/max assist (ace wrap right ankle)  with partial knee extension on right and vcs to decrease step  length on left. Partial right lap tray added to wc .   Dayne Dekay,[name] 02/28/2013, 9:12 AM

## 2013-02-28 NOTE — Progress Notes (Signed)
Patient voided in BR.  PVR scan is 0 ml.

## 2013-02-28 NOTE — Progress Notes (Signed)
Patient ID: Kevin Escobar, male   DOB: 01-13-1941, 72 y.o.   MRN: [id number] Subjective/Complaints: 72 y.o. male with a past medical history significant for HTN, TIA, CKD, lupus, renal artery stenosis s/p stent placement, AAA repair in 1995, prostate and bladder cancer; admitted on 02/21/13 with progressive difficulty walking due to right sided weakness and fall as well and 2 episodes of transient language impairment for >24 hours PTA. MRI/MRA brain with acute left posterior limb infarct and no IC stenosis. 2D echo with EF 60-65% and No wall abnormality. Carotid dopplers with mild soft plaque L-ICA origin. Patient with neurological worsening on 07/21  Wants to go home because daughter is visiting from [place] ROS Negative except for weakness on the right side, remainder of systems normal  Objective: Vital Signs: Blood pressure 157/89, pulse 80, temperature 97.9 F (36.6 C), temperature source Axillary, resp. rate 20, weight 59.24 kg (130 lb 9.6 oz), SpO2 98.00%. Dg Swallowing Func-speech Pathology    Results for orders placed during the hospital encounter of 02/20/13 (from the past 72 hour(s))  GLUCOSE, CAPILLARY     Status: Abnormal   Collection Time    02/25/13 11:46 AM      Result Value Range   Glucose-Capillary 132 (*) 70 - 99 mg/dL  GLUCOSE, CAPILLARY     Status: Abnormal   Collection Time    02/25/13  4:08 PM      Result Value Range   Glucose-Capillary 121 (*) 70 - 99 mg/dL  GLUCOSE, CAPILLARY     Status: None   Collection Time    02/25/13  9:01 PM      Result Value Range   Glucose-Capillary 99  70 - 99 mg/dL   Comment 1 Notify RN     Comment 2 Documented in Chart    GLUCOSE, CAPILLARY     Status: None   Collection Time    02/26/13  6:40 AM      Result Value Range   Glucose-Capillary 86  70 - 99 mg/dL   Comment 1 Documented in Chart     Comment 2 Notify RN    GLUCOSE, CAPILLARY     Status: Abnormal   Collection Time    02/26/13 11:20 AM      Result Value Range    Glucose-Capillary 101 (*) 70 - 99 mg/dL  GLUCOSE, CAPILLARY     Status: Abnormal   Collection Time    02/26/13  4:31 PM      Result Value Range   Glucose-Capillary 146 (*) 70 - 99 mg/dL     HEENT: Eyes not injected, oral mucosa moist, external ears normal Cardio: RRR and No murmur Resp: CTA B/L and unlabored GI: BS positive and Nontender Extremity:  Pulses positive and No Edema Skin:   Intact Neuro: Alert/Oriented, Flat, Cranial Nerve Abnormalities Right central 7, Normal Sensory, Abnormal Motor 0/5 in the right upper and right lower limb, Tone:  Hypotonia and Dysarthric Musc/Skel:  Normal General no acute distress   Assessment/Plan: 1. 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 Physiatrist is providing close team supervision and 24 hour management of active medical problems listed below. Physiatrist and rehab team continue to assess barriers to discharge/monitor patient progress toward functional and medical goals. FIM: FIM - Bathing Bathing Steps Patient Completed: Chest;Right Arm;Abdomen;Front perineal area;Right upper leg;Left upper leg Bathing: 3: Mod-Patient completes 5-7 74f 10 parts or 50-74%  FIM -  Upper Body Dressing/Undressing Upper body dressing/undressing: 1: Total-Patient completed less than 25% of tasks FIM - Lower Body Dressing/Undressing Lower body dressing/undressing: 1: Total-Patient completed less than 25% of tasks        FIM - Bed/Chair Transfer Bed/Chair Transfer: 2: Sit > Supine: Max A (lifting assist/Pt. 25-49%);2: Bed > Chair or W/C: Max A (lift and lower assist);2: Chair or W/C > Bed: Max A (lift and lower assist)  FIM - Locomotion: Wheelchair Locomotion: Wheelchair: 0: Activity did not occur FIM - Locomotion: Ambulation Ambulation/Gait Assistance: Not tested (comment)  Comprehension Comprehension Mode:  Auditory Comprehension: 5-Follows basic conversation/direction: With no assist  Expression Expression Mode: Verbal Expression: 5-Expresses basic needs/ideas: With no assist  Social Interaction Social Interaction: 7-Interacts appropriately with others - No medications needed.  Problem Solving Problem Solving: 5-Solves basic problems: With no assist  Memory Memory: 5-Recognizes or recalls 90% of the time/requires cueing < 10% of the time   Medical Problem List and Plan:  1. DVT Prophylaxis/Anticoagulation: Pharmaceutical: Lovenox  2. Pain Management: N/A  3. Mood: Provide anxiety for support. Has a supportive family. Will add nicotine patch per family request (used 1 1/2-2PPD) LCSW to follow for evaluation and support. Provide ego support.  4. Neuropsych: This patient is capable of making decisions on his own behalf.  5. HTN: will monitor with bid checks. Continue metoprolol, norvasc and catapres. Off lasix due to renal insufficiency/dysphagia diet.  6. Reactive leucocytosis: Resolving.  7. Dyslipidemia: Continue lipitor.  8. Dysphagia: continue D1, nectar liquids. Continue to educate patient and family on compliance of current restrictions and aspiration risk. Add nocturnal fluid for hydration as appears dry.  9. Prostate cancer/bladder CA: H/o frequency. Toilet every 2-3 hours to help with continence. Condom cath at night.  10. CKD: Will monitor with routine checks. Currently at baseline.   LOS (Days) 2 A FACE TO FACE EVALUATION WAS PERFORMED  Kevin Escobar 02/28/2013, 9:11 AM

## 2013-03-01 ENCOUNTER — Inpatient Hospital Stay (HOSPITAL_COMMUNITY): Payer: Medicare Other | Admitting: Occupational Therapy

## 2013-03-01 ENCOUNTER — Inpatient Hospital Stay (HOSPITAL_COMMUNITY): Payer: Medicare Other | Admitting: Rehabilitation

## 2013-03-01 ENCOUNTER — Inpatient Hospital Stay (HOSPITAL_COMMUNITY): Payer: Medicare Other | Admitting: Speech Pathology

## 2013-03-01 DIAGNOSIS — G8110 Spastic hemiplegia affecting unspecified side: Secondary | ICD-10-CM

## 2013-03-01 DIAGNOSIS — I69991 Dysphagia following unspecified cerebrovascular disease: Secondary | ICD-10-CM

## 2013-03-01 LAB — CBC WITH DIFFERENTIAL/PLATELET
Basophils Absolute: 0 10*3/uL (ref 0.0–0.1)
Eosinophils Relative: 10 % — ABNORMAL HIGH (ref 0–5)
HCT: 35.7 % — ABNORMAL LOW (ref 39.0–52.0)
Hemoglobin: 12.2 g/dL — ABNORMAL LOW (ref 13.0–17.0)
Lymphocytes Relative: 20 % (ref 12–46)
MCHC: 34.2 g/dL (ref 30.0–36.0)
MCV: 88.8 fL (ref 78.0–100.0)
Monocytes Absolute: 1 10*3/uL (ref 0.1–1.0)
Monocytes Relative: 11 % (ref 3–12)
RDW: 13.9 % (ref 11.5–15.5)
WBC: 8.8 10*3/uL (ref 4.0–10.5)

## 2013-03-01 LAB — COMPREHENSIVE METABOLIC PANEL
AST: 14 U/L (ref 0–37)
BUN: 41 mg/dL — ABNORMAL HIGH (ref 6–23)
CO2: 18 mEq/L — ABNORMAL LOW (ref 19–32)
Calcium: 9 mg/dL (ref 8.4–10.5)
Chloride: 111 mEq/L (ref 96–112)
Creatinine, Ser: 2.58 mg/dL — ABNORMAL HIGH (ref 0.50–1.35)
GFR calc Af Amer: 27 mL/min — ABNORMAL LOW (ref >90)
GFR calc non Af Amer: 23 mL/min — ABNORMAL LOW (ref >90)
Total Bilirubin: 0.4 mg/dL (ref 0.3–1.2)

## 2013-03-01 MED ORDER — ALPRAZOLAM 0.25 MG PO TABS
0.2500 mg | ORAL_TABLET | Freq: Two times a day (BID) | ORAL | Status: DC | PRN
  Administered 2013-03-01 – 2013-03-15 (×6): 0.25 mg via ORAL
  Filled 2013-03-01 (×8): qty 1

## 2013-03-01 NOTE — Progress Notes (Signed)
Social Work  Assessment and Plan  Patient Details  Name: Kevin Escobar MRN: [id number] Date of Birth: [birth year]  Today's Date: 03/01/2013  Problem List:  Patient Active Problem List   Diagnosis Date Noted  . Hypokalemia 02/22/2013  . Depression 02/21/2013  . PVD (peripheral vascular disease) 02/21/2013  . CVA (cerebral vascular accident) 02/20/2013  . Chronic kidney disease   . Hypertension   . TIA (transient ischemic attack)   . Peripheral vascular disease    Past Medical History:  Past Medical History  Diagnosis Date  . Cancer of bladder 2010  . Cancer of prostate 1994  . Depression   . Chronic kidney disease   . Hypertension   . TIA (transient ischemic attack) 2008  . Peripheral vascular disease   . PVD (peripheral vascular disease) 02/21/2013   Past Surgical History:  Past Surgical History  Procedure Laterality Date  . Abdominal aortic aneurysm repair  1995  . Prostate surgery  1994  . Colon surgery  2010   Social History:  reports that he has been smoking.  He has never used smokeless tobacco. He reports that he does not drink alcohol or use illicit drugs.  Family / Support Systems Marital Status: Married How Long?: 49 years Patient Roles: Spouse;Parent Spouse/Significant Other: Asencion Islam [phone] [facility] [place]  [phone]) Children: Gaspar Cola [phone] Other Supports: AA group Anticipated Caregiver: wife, Bolling Ability/Limitations of Caregiver: light Min A Caregiver Availability: 24/7 Family Dynamics: Good family support, but daughter, Nicholos Johns, lives in [place] and daughter, Aundra Millet, has some developmental disabilities.  Brother and his wife are supportive; live in [place].  Social History Preferred language: English Religion:  Cultural Background: N/A Education: college Read: Yes Write: Yes Employment Status: Retired Fish farm manager Issues: none Guardian/Conservator: none   Abuse/Neglect  None  Emotional  Status Pt's affect, behavior adn adjustment status: Pt is frustrated about stroke/hospitalization and reports feeling slightly depressed.  He is very determined and motivated to work hard with therapies in order to get home.  He was tearful while CSW was in the room with everything he is experiencing and when talking about his hx with AA group.   Recent Psychosocial Issues: none Pyschiatric History: Pt reports hx of depression.  Took Wellbutrin about 4 or 5 years ago.  Experienced weight loss and family does not want him to try that medication again.  Pt was not experiencing depression PTA. 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  Dtr is interested in receiving more detailed information on pt's functional limitations and his needs upon d/c in order to help pt/wife prepare the home for him. Premorbid pt/family roles/activities: Pt was doing yardwork and driving Aundra Millet to/from work daily. Anticipated changes in roles/activities/participation: Someone will need to assist with the above. Pt/family expectations/goals: Pt wishes to leave rehab unit "as soon as possible".  Dtr would "prefer that he go home from Rehab" rather than a SNF stay.  Community Resources [employer]: None Premorbid Home Care/DME Agencies: None Transportation available at discharge: Family can transport pt at discharge, although dtr is concerned about wife transferring pt in/out of car for MD appts on her own when dtr returns to [place]. Resource referrals recommended: Neuropsychology;Support group (specify)  Discharge Planning Insurance Resources: Medicare;Private Insurance (specify) ([employer] Part B-needs pre-cert for out of hospital  srvs) Financial Resources: SSI;Other  (Comment) Financial Screen Referred: No Living Expenses: Own Money Management: Patient;Spouse Does the patient have any problems obtaining your medications?: No Home Management: Wife/dtr are taking care of this. Patient/Family Preliminary Plans: Pt/family would like for pt to go straight home from Inpatient Rehab with home supports and modifications. Barriers to Discharge: Steps Social Work Anticipated Follow Up Needs: HH/OP;SNF DC Planning Additional Notes/Comments: Wife may wish to attend stroke support group or other caregiver support group. Expected length of stay: @ 3 weeks  Clinical Impression Pt was admitted to Rehab post CVA.  He has good family support from his wife, daughters, and brother.  Family is willing to modify home as needed. Pt is anxious and reports feeling slightly depressed and frustrated with hospitalization and new physical limitations. Pt is having trouble sleeping, which he struggled with PTA, but it seems to be more severe while hospitalized. He is determined and motivated to work hard in therapies in order to return home.       Kristyne Woodring, [place] 03/01/2013, 1:30 PM

## 2013-03-01 NOTE — Progress Notes (Signed)
[facility]patient Rehabilitation Center Individual Statement of Services  Patient Name:  Kevin Escobar  Date:  03/01/2013  Welcome to the Inpatient Rehabilitation Center.  Our goal is to provide you with an individualized program based on your diagnosis and situation, designed to meet your specific needs.  With this comprehensive rehabilitation program, you will be expected to participate in at least 3 hours of rehabilitation therapies Monday-Friday, with modified therapy programming on the weekends.  Your rehabilitation program will include the following services:  Physical Therapy (PT), Occupational Therapy (OT), Speech Therapy (ST), 24 hour per day rehabilitation nursing, Therapeutic Recreaction (TR), Neuropsychology, Case Management (Social Worker), Rehabilitation Medicine, Nutrition Services and Pharmacy Services  Weekly team conferences will be held on Wednesdays to discuss your progress.  Your Social Worker will talk with you frequently to get your input and to update you on team discussions.  Team conferences with you and your family in attendance may also be held.  Expected length of stay: 2-3 weeks  Overall anticipated outcome: Minimal assistance  Depending on your progress and recovery, your program may change. Your Social Worker will coordinate services and will keep you informed of any changes. Your Social Worker's name and contact numbers are listed  below.  The following services may also be recommended but are not provided by the Inpatient Rehabilitation Center:   Driving Evaluations  Home Health Rehabiltiation Services  Outpatient Rehabilitatation Services   Arrangements will be made to provide these services after discharge if needed.  Arrangements include referral to agencies that provide these services.  Your insurance has been verified to be:  Medicare Part A and Tedd Sias Your primary doctor is:  Dr. Gweneth Dimitri  Pertinent information will be shared with your doctor and  your insurance company.   Social Worker:  Staci Acosta, [place]  [phone] (c)  Information discussed with and copy given to patient by: Elvera Lennox, 03/01/2013, 9:44 AM

## 2013-03-01 NOTE — Progress Notes (Signed)
Patient ID: Kevin Escobar, male   DOB: June 26, 1941, 72 y.o.   MRN: [id number] Subjective/Complaints: 72 y.o. male with a past medical history significant for HTN, TIA, CKD, lupus, renal artery stenosis s/p stent placement, AAA repair in 1995, prostate and bladder cancer; admitted on 02/21/13 with progressive difficulty walking due to right sided weakness and fall as well and 2 episodes of transient language impairment for >24 hours PTA. MRI/MRA brain with acute left posterior limb infarct and no IC stenosis. 2D echo with EF 60-65% and No wall abnormality. Carotid dopplers with mild soft plaque L-ICA origin. Patient with neurological worsening on 07/21   Apologized for insisting on transfer ROS Negative except for weakness on the right side, remainder of systems normal  Objective: Vital Signs: Blood pressure 157/86, pulse 74, temperature 97.1 F (36.2 C), temperature source Oral, resp. rate 18, weight 59.24 kg (130 lb 9.6 oz), SpO2 96.00%. Dg Swallowing Func-speech Pathology    Results for orders placed during the hospital encounter of 02/26/13 (from the past 72 hour(s))  CBC WITH DIFFERENTIAL     Status: Abnormal   Collection Time    03/01/13  5:15 AM      Result Value Range   WBC 8.8  4.0 - 10.5 K/uL   RBC 4.02 (*) 4.22 - 5.81 MIL/uL   Hemoglobin 12.2 (*) 13.0 - 17.0 g/dL   HCT 40.9 (*) 81.1 - 91.4 %   MCV 88.8  78.0 - 100.0 fL   MCH 30.3  26.0 - 34.0 pg   MCHC 34.2  30.0 - 36.0 g/dL   RDW 78.2  95.6 - 21.3 %   Platelets 230  150 - 400 K/uL   Neutrophils Relative % 60  43 - 77 %   Neutro Abs 5.2  1.7 - 7.7 K/uL   Lymphocytes Relative 20  12 - 46 %   Lymphs Abs 1.7  0.7 - 4.0 K/uL   Monocytes Relative 11  3 - 12 %   Monocytes Absolute 1.0  0.1 - 1.0 K/uL   Eosinophils Relative 10 (*) 0 - 5 %   Eosinophils Absolute 0.8 (*) 0.0 - 0.7 K/uL   Basophils Relative 0  0 - 1 %   Basophils Absolute 0.0  0.0 - 0.1 K/uL  COMPREHENSIVE METABOLIC PANEL     Status: Abnormal   Collection  Time    03/01/13  5:15 AM      Result Value Range   Sodium 140  135 - 145 mEq/L   Potassium 4.2  3.5 - 5.1 mEq/L   Chloride 111  96 - 112 mEq/L   CO2 18 (*) 19 - 32 mEq/L   Glucose, Bld 79  70 - 99 mg/dL   BUN 41 (*) 6 - 23 mg/dL   Creatinine, Ser 0.86 (*) 0.50 - 1.35 mg/dL   Calcium 9.0  8.4 - 57.8 mg/dL   Total Protein 6.2  6.0 - 8.3 g/dL   Albumin 2.6 (*) 3.5 - 5.2 g/dL   AST 14  0 - 37 U/L   ALT 14  0 - 53 U/L   Alkaline Phosphatase 71  39 - 117 U/L   Total Bilirubin 0.4  0.3 - 1.2 mg/dL   GFR calc non Af Amer 23 (*) >90 mL/min   GFR calc Af Amer 27 (*) >90 mL/min   Comment:            The eGFR has been calculated     using the CKD EPI equation.  This calculation has not been     validated in all clinical     situations.     eGFR's persistently     <90 mL/min signify     possible Chronic Kidney Disease.     HEENT: Eyes not injected, oral mucosa moist, external ears normal Cardio: RRR and No murmur Resp: CTA B/L and unlabored GI: BS positive and Nontender Extremity:  Pulses positive and No Edema Skin:   Intact Neuro: Alert/Oriented, Flat, Cranial Nerve Abnormalities Right central 7, Normal Sensory, Abnormal Motor 0/5 in the right upper and right lower limb, Tone:  Hypotonia and Dysarthric Musc/Skel:  Normal General no acute distress   Assessment/Plan: 1. 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 Physiatrist is providing close team supervision and 24 hour management of active medical problems listed below. Physiatrist and rehab team continue to assess barriers to discharge/monitor patient progress toward functional and medical goals. FIM: FIM - Bathing Bathing Steps Patient Completed: Chest;Right Arm;Abdomen;Front perineal area;Right upper leg;Left upper leg Bathing: 3: Mod-Patient completes 5-7 10f 10 parts or 50-74%  FIM - Upper Body  Dressing/Undressing Upper body dressing/undressing: 1: Total-Patient completed less than 25% of tasks FIM - Lower Body Dressing/Undressing Lower body dressing/undressing: 1: Total-Patient completed less than 25% of tasks        FIM - Bed/Chair Transfer Bed/Chair Transfer: 2: Sit > Supine: Max A (lifting assist/Pt. 25-49%);2: Bed > Chair or W/C: Max A (lift and lower assist);2: Chair or W/C > Bed: Max A (lift and lower assist)  FIM - Locomotion: Wheelchair Locomotion: Wheelchair: 0: Activity did not occur FIM - Locomotion: Ambulation Ambulation/Gait Assistance: Not tested (comment)  Comprehension Comprehension Mode: Auditory Comprehension: 5-Follows basic conversation/direction: With no assist  Expression Expression Mode: Verbal Expression: 5-Expresses basic needs/ideas: With no assist  Social Interaction Social Interaction: 7-Interacts appropriately with others - No medications needed.  Problem Solving Problem Solving: 5-Solves basic problems: With no assist  Memory Memory: 5-Recognizes or recalls 90% of the time/requires cueing < 10% of the time   Medical Problem List and Plan:  1. DVT Prophylaxis/Anticoagulation: Pharmaceutical: Lovenox  2. Pain Management: N/A  3. Mood: Provide anxiety for support. Has a supportive family. Will add nicotine patch per family request (used 1 1/2-2PPD) LCSW to follow for evaluation and support. Provide ego support.  4. Neuropsych: This patient is capable of making decisions on his own behalf.  5. HTN: will monitor with bid checks. Continue metoprolol, norvasc and catapres. Off lasix due to renal insufficiency/dysphagia diet.  6. Reactive leucocytosis: Resolving.  7. Dyslipidemia: Continue lipitor.  8. Dysphagia: continue D1, nectar liquids. Continue to educate patient and family on compliance of current restrictions and aspiration risk. Add nocturnal fluid for hydration as appears dry.  9. Prostate cancer/bladder CA: H/o frequency. Toilet  every 2-3 hours to help with continence. Condom cath at night.  10. CKD: Will monitor with routine checks. Currently at baseline.   LOS (Days) 3 A FACE TO FACE EVALUATION WAS PERFORMED  Trayquan Kolakowski E 03/01/2013, 8:25 AM

## 2013-03-01 NOTE — Progress Notes (Signed)
Patient information reviewed and entered into [facility] System by Becky Nechelle Petrizzo, covering PPS coordinator. Information including medical coding and functional independence measure will be reviewed and updated through discharge.  Per nursing, patient was given "Data Collection Information Summary for Patients in Inpatient Rehabilitation Facilities with attached Privacy Act Statement Health Care Records" upon admission.     

## 2013-03-01 NOTE — Progress Notes (Signed)
Physical Therapy Note  Patient Details  Name: Kevin Escobar MRN: [id number] Date of Birth: 05-06-1941 Today's Date: 03/01/2013  1100-1155 (55 minutes) individual Pain: no reported pain Other: Oxygen sats 95-96% RA (resting)  Focus of treatment: Neuro re-ed LT LE; gait training  Treatment: Pt in bed upon arrival; nursing tech reports pt. Has more difficulty breathing (see Oxygen sats above) ; nurse states that pt. Awaiting meds for anxiety; transfers wc >< mat mod assist squat/pivot; Neuro re-ed RT LE using powder board and skate to facilitate active hip flexion in sidelying; pt able to flex hip approximately 1/2 range but quickly fatigues; pt able to initiate isolated knee extension/flexion in sidelying using vibrator for stimulation; gait with rail left 20 feet, 10 feet mod assist with pt intermittently partially advancing rt LE (with ace wrap ankle) and improving right knee extension in stance.   Kevin Escobar,[name] 03/01/2013, 11:53 AM

## 2013-03-01 NOTE — Progress Notes (Signed)
Occupational Therapy Session Note  Patient Details  Name: Kevin Escobar MRN: [id number] Date of Birth: 1940/12/03  Today's Date: 03/01/2013 Time: 4098-1191 and 4782-9562 Time Calculation (min): 53 min and 44 min  Short Term Goals: Week 1:  OT Short Term Goal 1 (Week 1): Pt will don shirt using hemi dressing techniques with mod A  OT Short Term Goal 2 (Week 1): P t will transfer to toilet/ BSC with mod A consistently  OT Short Term Goal 3 (Week 1): Pt will demonstrate sitting balance EOB/EOM with supervision for 5 min  OT Short Term Goal 4 (Week 1): Pt will perform sit to stand with mod A for clothing management OT Short Term Goal 5 (Week 1): Pt will perform oral care with min A   Skilled Therapeutic Interventions/Progress Updates:    1) Pt seen for ADL retraining with focus on bed mobility, midline orientation, and sit <> stand during self-care tasks of bathing and dressing.  Pt in bed upon arrival, squat pivot transfer to Rt with max assist secondary to decreased weight shift forward and acceptance of weight through RLE.  Bathing and dressing completed at sit to stand level at sink with cues for sequencing with bathing.  Mod-max assist sit to stand with blocking at Rt knee for stability.  Pt incontinent of urine in standing for perineal hygiene - reports not sensing urge to go, but able to identify sensation once started voiding.  Pt assisted with hygiene post incontinent episode.  Pt requested to return to bed at end of session, reports "worn out" from session.  2) Pt asleep in w/c with lunch tray in front and family present.  Pt's daughter reports pt received xanax for anxiety prior to lunch and pt does not typically respond well to anxiety meds.  Pt arousable and willing to participate in treatment session.  Engaged in squat pivot transfers, weight bearing through RUE, and dynamic standing balance.  Gentle ROM with RUE with pt reporting pain in Rt shoulder with abduction > 90 degrees and  slight pain in radial wrist with weight bearing.  Weight bearing in sitting with Rt shoulder in external rotation and weight shifting through trunk with reaching outside BOS, pt with LOB to Rt requiring physical assistance to return to midline.  Engaged in sit <> stand x4 progressing from max-mod assist throughout session with blocking at Rt knee and RUE over this clinician's shoulder to promote upright standing.  Pt benefits from visual cues to reach up to promote upright standing and trunk control.  Pt returned to room and left in w/c with all necessary items in reach.  Therapy Documentation Precautions:  Precautions Precautions: Fall Precaution Comments: right hemiparesis  Restrictions Weight Bearing Restrictions: No Pain:  Pt with no c/o pain this session.  See FIM for current functional status  Therapy/Group: Individual Therapy  Rosalio Loud 03/01/2013, 10:48 AM

## 2013-03-01 NOTE — Evaluation (Signed)
Speech Language Pathology Bedside Swallow Evaluation   Patient Details  Name: Kevin Escobar MRN: [id number] Date of Birth: 1940-12-03  SLP Diagnosis: Dysarthria;Dysphagia  Rehab Potential: Good ELOS: 3-4 weeks   Today's Date: 03/01/2013 Time: 1530-1600 Time Calculation (min): 30 min  Problem List:  Patient Active Problem List   Diagnosis Date Noted  . Hypokalemia 02/22/2013  . Depression 02/21/2013  . PVD (peripheral vascular disease) 02/21/2013  . CVA (cerebral vascular accident) 02/20/2013  . Chronic kidney disease   . Hypertension   . TIA (transient ischemic attack)   . Peripheral vascular disease    Past Medical History:  Past Medical History  Diagnosis Date  . Cancer of bladder 2010  . Cancer of prostate 1994  . Depression   . Chronic kidney disease   . Hypertension   . TIA (transient ischemic attack) 2008  . Peripheral vascular disease   . PVD (peripheral vascular disease) 02/21/2013   Past Surgical History:  Past Surgical History  Procedure Laterality Date  . Abdominal aortic aneurysm repair  1995  . Prostate surgery  1994  . Colon surgery  2010    Assessment / Plan / Recommendation Clinical Impression  Patient presents with a moderate oral and pharyngeal sensory-motor based dysphagia, characterized by oral weakness resulting in prolonged oral phase, premature loss of thin boluses and sensed aspiration at times.  As a result, it is recommended that this patient continue a Dys.1 textures diet with nectar-thick liquids and water protocol between meals.  SLP re-educated patient regarding diet and liquid modifications as well as rationale due to his inability to recall, as well as procedures for the water protocol.        SLP Assessment  Patient will need skilled Speech Lanaguage Pathology Services during CIR admission    Recommendations  Diet Recommendations: Dysphagia 1 (Puree);Nectar-thick liquid (water protocol) Liquid Administration via: Cup Medication  Administration: Crushed with puree Supervision: Full supervision/cueing for compensatory strategies Compensations: Slow rate;Small sips/bites;Check for pocketing;Follow solids with liquid Postural Changes and/or Swallow Maneuvers: Seated upright 90 degrees;Upright 30-60 min after meal Oral Care Recommendations: Oral care BID Recommendations for Other Services: Neuropsych consult Patient destination:  (TBD) Follow up Recommendations: Home Health SLP;Skilled Nursing facility;24 hour supervision/assistance Equipment Recommended: None recommended by SLP    SLP Frequency 5 out of 7 days   SLP Treatment/Interventions Cueing hierarchy;Dysphagia/aspiration precaution training;Functional tasks;Internal/external aids;Oral motor exercises;Patient/family education;Therapeutic Activities    Pain  0 Prior Functioning  WFL per patient report  Short Term Goals: Week 1: SLP Short Term Goal 1 (Week 1): The patient will perform oral motor exercises to increase speech intelligibility and decrease aspiration risk, with min A multimodal cueing.  SLP Short Term Goal 2 (Week 1): Patient will perform pharyngeal strengthening exercises to decrease aspiration risk, with min A multimodal cueing. SLP Short Term Goal 3 (Week 1): Patient will perform diaphragmatic exercises to increase vocal intensity, with min A multimodal cueing.  SLP Short Term Goal 4 (Week 1): Patient will demonstrate sustained attention to functional tasks, in noisy environment, with supervision verbal cues for redirection, in order to increase independence in his environment.  SLP Short Term Goal 4 - Progress (Week 1): Discontinued (comment) SLP Short Term Goal 5 (Week 1): Patient will utilize compensatory strategies to increase speech intelligibility with min A verbal cues.    See FIM for current functional status Refer to Care Plan for Long Term Goals  Recommendations for other services: Neuropsych  Discharge Criteria: Patient will be  discharged from  SLP if patient refuses treatment 3 consecutive times without medical reason, if treatment goals not met, if there is a change in medical status, if patient makes no progress towards goals or if patient is discharged from hospital.  The above assessment, treatment plan, treatment alternatives and goals were discussed and mutually agreed upon: by patient  Charlane Ferretti., CCC-SLP [phone]  Jaylissa Felty 03/01/2013, 4:50 PM

## 2013-03-02 ENCOUNTER — Inpatient Hospital Stay (HOSPITAL_COMMUNITY): Payer: Medicare Other | Admitting: Occupational Therapy

## 2013-03-02 ENCOUNTER — Inpatient Hospital Stay (HOSPITAL_COMMUNITY): Payer: Medicare Other | Admitting: Rehabilitation

## 2013-03-02 ENCOUNTER — Inpatient Hospital Stay (HOSPITAL_COMMUNITY): Payer: Medicare Other | Admitting: Speech Pathology

## 2013-03-02 DIAGNOSIS — I6330 Cerebral infarction due to thrombosis of unspecified cerebral artery: Secondary | ICD-10-CM

## 2013-03-02 DIAGNOSIS — I69991 Dysphagia following unspecified cerebrovascular disease: Secondary | ICD-10-CM

## 2013-03-02 NOTE — Progress Notes (Signed)
Speech Language Pathology Daily Session Note  Patient Details  Name: Kevin Escobar MRN: [id number] Date of Birth: [birth year]  Today's Date: 03/02/2013 Time: 6295-2841 Time Calculation (min): 20 min  Short Term Goals: Week 1: SLP Short Term Goal 1 (Week 1): The patient will perform oral motor exercises to increase speech intelligibility and decrease aspiration risk, with min A multimodal cueing.  SLP Short Term Goal 2 (Week 1): Patient will perform pharyngeal strengthening exercises to decrease aspiration risk, with min A multimodal cueing. SLP Short Term Goal 3 (Week 1): Patient will perform diaphragmatic exercises to increase vocal intensity, with min A multimodal cueing.  SLP Short Term Goal 4 (Week 1): Patient will demonstrate sustained attention to functional tasks, in noisy environment, with supervision verbal cues for redirection, in order to increase independence in his environment.  SLP Short Term Goal 4 - Progress (Week 1): Discontinued (comment) SLP Short Term Goal 5 (Week 1): Patient will utilize compensatory strategies to increase speech intelligibility with min A verbal cues.    Skilled Therapeutic Interventions: Group, co-treatment with OT to address self-feeding and cognition; SLP facilitated session by addressing dysphagia and cognition goals.  Patient consumed Dys.1 textures and nectar-thick liquids via cup with set-up assist and Supervision level verbal cues to utilize safe swallow strategies.  Patient demonstrated intermittent wet vocal quality throughout session that he appeared to clear with reflexive intermittent coughs.  Patient also consumed trials of Dys.2 textures during session and demonstrated efficient mastication.  Recommend to continue trials of upgraded textures with SLP.   FIM:  Comprehension Comprehension Mode: Auditory Comprehension: 6-Follows complex conversation/direction: With extra time/assistive device Expression Expression Mode: Verbal Expression:  3-Expresses basic 50 - 74% of the time/requires cueing 25 - 50% of the time. Needs to repeat parts of sentences. Social Interaction Social Interaction: 5-Interacts appropriately 90% of the time - Needs monitoring or encouragement for participation or interaction. Problem Solving Problem Solving: 5-Solves basic problems: With no assist Memory Memory: 4-Recognizes or recalls 75 - 89% of the time/requires cueing 10 - 24% of the time FIM - Eating Eating Activity: 5: Set-up assist for open containers;5: Needs verbal cues/supervision  Pain Pain Assessment Pain Assessment: No/denies pain  Therapy/Group: Group Therapy  Charlane Ferretti., CCC-SLP [phone]  Wendy Mikles 03/02/2013, 2:12 PM

## 2013-03-02 NOTE — Progress Notes (Signed)
Speech Language Pathology Daily Session Note  Patient Details  Name: Kevin Escobar MRN: [id number] Date of Birth: 1941-04-28  Today's Date: 03/02/2013 Time: 9562-1308 Time Calculation (min): 57 min  Short Term Goals: Week 1: SLP Short Term Goal 1 (Week 1): The patient will perform oral motor exercises to increase speech intelligibility and decrease aspiration risk, with min A multimodal cueing.  SLP Short Term Goal 2 (Week 1): Patient will perform pharyngeal strengthening exercises to decrease aspiration risk, with min A multimodal cueing. SLP Short Term Goal 3 (Week 1): Patient will perform diaphragmatic exercises to increase vocal intensity, with min A multimodal cueing.  SLP Short Term Goal 4 (Week 1): Patient will demonstrate sustained attention to functional tasks, in noisy environment, with supervision verbal cues for redirection, in order to increase independence in his environment.  SLP Short Term Goal 4 - Progress (Week 1): Discontinued (comment) SLP Short Term Goal 5 (Week 1): Patient will utilize compensatory strategies to increase speech intelligibility with min A verbal cues.    Skilled Therapeutic Interventions: Skilled treatment session focused on addressing dysphagia and dysarthria goals.  SLP facilitated session by educating patient on oral-motor exercises address labial and lingual strength as well as pharyngeal strengthening exercises with Min verbal and visual cues to perform accurately following SLP demonstration.  SLP also educated patient regarding use of speech intelligibility strategies and patient required Mod cues to utilize during phrase level verbal expression throughout session.  Patient also consumed ice chips though out session with intermittent wet vocal quality and Max encouragement to produce effortful cough or throat clear; however, patient was unable to produce either.  SLP suspects fatigue was a limiting factor.  Will reinforce during next session and add  diaphragmatic breathing exercises; continue with current plan of care.    FIM:  Comprehension Comprehension Mode: Auditory Comprehension: 6-Follows complex conversation/direction: With extra time/assistive device Expression Expression Mode: Verbal Expression: 3-Expresses basic 50 - 74% of the time/requires cueing 25 - 50% of the time. Needs to repeat parts of sentences. Social Interaction Social Interaction: 5-Interacts appropriately 90% of the time - Needs monitoring or encouragement for participation or interaction. Problem Solving Problem Solving: 5-Solves basic problems: With no assist Memory Memory: 4-Recognizes or recalls 75 - 89% of the time/requires cueing 10 - 24% of the time FIM - Eating Eating Activity: 5: Set-up assist for open containers;5: Needs verbal cues/supervision  Pain Pain Assessment Pain Assessment: No/denies pain  Therapy/Group: Individual Therapy  Charlane Ferretti., CCC-SLP [phone]  Sandia Pfund 03/02/2013, 11:26 AM

## 2013-03-02 NOTE — Progress Notes (Addendum)
Physical Therapy Session Note  Patient Details  Name: Kevin Escobar MRN: [id number] Date of Birth: 09-23-1940  Today's Date: 03/02/2013 Time: 1400-1500 Time Calculation (min): 60 min  Short Term Goals: Week 1:  PT Short Term Goal 1 (Week 1): Patient will be able to perform bed mobility with Moderate assist. PT Short Term Goal 2 (Week 1): Patient will be able to perform transfers with Moderate assist. PT Short Term Goal 3 (Week 1): Patient will be able to perform gait using LRAD x 10' with Moderate assist x 2. PT Short Term Goal 4 (Week 1): Patient will be able to perform static standing balance with minimum assist. PT Short Term Goal 5 (Week 1): Patient will be able to perform dynamic standing balance with Moderate assist.  Skilled Therapeutic Interventions/Progress Updates:   Pt received in room in bed asleep.  Was arousable and agreeable to participate in therapy, however he c/o fatigue throughout entire session.  Noted that pt had increased work of breathing during session, however vitals remained stable throughout.  Pt self propelled approx 100' using L UE/LE with cues for proper technique.  Performed NMR in L SL position to increase hip flex/ext activation on powder board.  Performed 2 sets of 10 reps, however pt requires increased assist today from therapist to complete ROM and increased tapping for facilitation and pt with increased work of breathing and c/o fatigue.  Assisted pt back into sitting position and allowed pt to rest and work on deep breathing.  Provided pt with ice chips intermittently during session.  Performed stand pivot transfer x 3 at max assist and cues for hand placement, technique, and placement of RLE and WB through LLE.  Attempted ambulation in hallway using L handrail x 8' x 2 reps at max assist (+2 assist for safety and chair follow).  Pt very fatigued, however was able to demonstrate some quad control and requires less blocking of L knee during stance phase.   Provided manual and visual cues for increased step length on RLE.  Assisted pt back to room and into recliner with placement of table in front for safety (unable to find second quick release belt).   Call bell and phone placed within reach.    Therapy Documentation Precautions:  Precautions Precautions: Fall Precaution Comments: right hemiparesis  Restrictions Weight Bearing Restrictions: No   Pain: Pain Assessment Pain Assessment: No/denies pain   Locomotion : Ambulation Ambulation/Gait Assistance: 1: +2 Total assist Wheelchair Mobility Distance: 100'  See FIM for current functional status  Therapy/Group: Individual Therapy  Vista Deck 03/02/2013, 4:15 PM

## 2013-03-02 NOTE — Progress Notes (Signed)
Occupational Therapy Note  Patient Details  Name: PEREZ DIRICO MRN: [id number] Date of Birth: June 22, 1941 Today's Date: 03/02/2013  [employer] with SLP [phone] No c/o pain Group, co-treatment  self-feeding and cognition;focus on attention to vocal quality with liquids and food, management of food in mouth with supervision, attention to right UE and and it positioning, activity tolerance and endurance, functional communication in group setting.     Roney Mans [facility] 03/02/2013, 4:53 PM

## 2013-03-02 NOTE — Progress Notes (Signed)
Occupational Therapy Session Note  Patient Details  Name: Kevin Escobar MRN: [id number] Date of Birth: 20-Feb-1941  Today's Date: 03/02/2013 Time: 4098-1191 Time Calculation (min): 57 min  Short Term Goals: Week 1:  OT Short Term Goal 1 (Week 1): Pt will don shirt using hemi dressing techniques with mod A  OT Short Term Goal 2 (Week 1): P t will transfer to toilet/ BSC with mod A consistently  OT Short Term Goal 3 (Week 1): Pt will demonstrate sitting balance EOB/EOM with supervision for 5 min  OT Short Term Goal 4 (Week 1): Pt will perform sit to stand with mod A for clothing management OT Short Term Goal 5 (Week 1): Pt will perform oral care with min A   Skilled Therapeutic Interventions/Progress Updates:    Pt seen for ADL retraining with focus on bed mobility, midline orientation, and sit <> stand during self-care tasks of bathing and dressing. Pt in bed upon arrival, squat pivot transfer to Lt with mod-max assist secondary to decreased weight shift forward. Bathing and dressing completed at sit to stand level at sink with cues for sequencing with bathing. Mod assist sit to stand with blocking at Rt knee for stability. Pt with increased participation with bathing and dressing this session, demonstrating increased dynamic sitting balance with attempts to wash BLE, unable to reach feet.  Pt requested to shave this session, educated on blood thinner and this clinician shaved pt after he setup with shaving cream.  Therapy Documentation Precautions:  Precautions Precautions: Fall Precaution Comments: right hemiparesis  Restrictions Weight Bearing Restrictions: No General:   Vital Signs: Therapy Vitals Pulse Rate: 80 BP: 160/78 mmHg Pain: Pain Assessment Pain Assessment: No/denies pain  See FIM for current functional status  Therapy/Group: Individual Therapy  Rosalio Loud 03/02/2013, 11:22 AM

## 2013-03-02 NOTE — Progress Notes (Signed)
Patient ID: Kevin Escobar, male   DOB: Aug 10, 1940, 72 y.o.   MRN: [id number] Subjective/Complaints: 72 y.o. male with a past medical history significant for HTN, TIA, CKD, lupus, renal artery stenosis s/p stent placement, AAA repair in 1995, prostate and bladder cancer; admitted on 02/21/13 with progressive difficulty walking due to right sided weakness and fall as well and 2 episodes of transient language impairment for >24 hours PTA. MRI/MRA brain with acute left posterior limb infarct and no IC stenosis. 2D echo with EF 60-65% and No wall abnormality. Carotid dopplers with mild soft plaque L-ICA origin. Patient with neurological worsening on 07/21   Poor sleep otherwise no C/O ROS Negative except for weakness on the right side, remainder of systems normal  Objective: Vital Signs: Blood pressure 159/85, pulse 71, temperature 97.6 F (36.4 C), temperature source Oral, resp. rate 18, weight 59.24 kg (130 lb 9.6 oz), SpO2 93.00%. Dg Swallowing Func-speech Pathology    Results for orders placed during the hospital encounter of 02/26/13 (from the past 72 hour(s))  CBC WITH DIFFERENTIAL     Status: Abnormal   Collection Time    03/01/13  5:15 AM      Result Value Range   WBC 8.8  4.0 - 10.5 K/uL   RBC 4.02 (*) 4.22 - 5.81 MIL/uL   Hemoglobin 12.2 (*) 13.0 - 17.0 g/dL   HCT 78.2 (*) 95.6 - 21.3 %   MCV 88.8  78.0 - 100.0 fL   MCH 30.3  26.0 - 34.0 pg   MCHC 34.2  30.0 - 36.0 g/dL   RDW 08.6  57.8 - 46.9 %   Platelets 230  150 - 400 K/uL   Neutrophils Relative % 60  43 - 77 %   Neutro Abs 5.2  1.7 - 7.7 K/uL   Lymphocytes Relative 20  12 - 46 %   Lymphs Abs 1.7  0.7 - 4.0 K/uL   Monocytes Relative 11  3 - 12 %   Monocytes Absolute 1.0  0.1 - 1.0 K/uL   Eosinophils Relative 10 (*) 0 - 5 %   Eosinophils Absolute 0.8 (*) 0.0 - 0.7 K/uL   Basophils Relative 0  0 - 1 %   Basophils Absolute 0.0  0.0 - 0.1 K/uL  COMPREHENSIVE METABOLIC PANEL     Status: Abnormal   Collection Time     03/01/13  5:15 AM      Result Value Range   Sodium 140  135 - 145 mEq/L   Potassium 4.2  3.5 - 5.1 mEq/L   Chloride 111  96 - 112 mEq/L   CO2 18 (*) 19 - 32 mEq/L   Glucose, Bld 79  70 - 99 mg/dL   BUN 41 (*) 6 - 23 mg/dL   Creatinine, Ser 6.29 (*) 0.50 - 1.35 mg/dL   Calcium 9.0  8.4 - 52.8 mg/dL   Total Protein 6.2  6.0 - 8.3 g/dL   Albumin 2.6 (*) 3.5 - 5.2 g/dL   AST 14  0 - 37 U/L   ALT 14  0 - 53 U/L   Alkaline Phosphatase 71  39 - 117 U/L   Total Bilirubin 0.4  0.3 - 1.2 mg/dL   GFR calc non Af Amer 23 (*) >90 mL/min   GFR calc Af Amer 27 (*) >90 mL/min   Comment:            The eGFR has been calculated     using the CKD EPI equation.  This calculation has not been     validated in all clinical     situations.     eGFR's persistently     <90 mL/min signify     possible Chronic Kidney Disease.     HEENT: Eyes not injected, oral mucosa moist, external ears normal Cardio: RRR and No murmur Resp: CTA B/L and unlabored GI: BS positive and Nontender Extremity:  Pulses positive and No Edema Skin:   Intact Neuro: Alert/Oriented, Flat, Cranial Nerve Abnormalities Right central 7, Normal Sensory, Abnormal Motor 0/5 in the right upper and right lower limb, except 1/5 R finger flexorsTone:  Hypotonia and Dysarthric Musc/Skel:  Normal General no acute distress   Assessment/Plan: 1. 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 Physiatrist is providing close team supervision and 24 hour management of active medical problems listed below. Physiatrist and rehab team continue to assess barriers to discharge/monitor patient progress toward functional and medical goals. FIM: FIM - Bathing Bathing Steps Patient Completed: Chest;Right Arm;Abdomen;Front perineal area;Right upper leg;Left upper leg Bathing: 3: Mod-Patient completes 5-7 47f 10 parts or  50-74%  FIM - Upper Body Dressing/Undressing Upper body dressing/undressing steps patient completed: Thread/unthread left sleeve of front closure shirt/dress Upper body dressing/undressing: 1: Total-Patient completed less than 25% of tasks FIM - Lower Body Dressing/Undressing Lower body dressing/undressing: 1: Total-Patient completed less than 25% of tasks        FIM - Bed/Chair Transfer Bed/Chair Transfer: 2: Supine > Sit: Max A (lifting assist/Pt. 25-49%);2: Bed > Chair or W/C: Max A (lift and lower assist);2: Chair or W/C > Bed: Max A (lift and lower assist);3: Sit > Supine: Mod A (lifting assist/Pt. 50-74%/lift 2 legs)  FIM - Locomotion: Wheelchair Locomotion: Wheelchair: 0: Activity did not occur FIM - Locomotion: Ambulation Ambulation/Gait Assistance: Not tested (comment)  Comprehension Comprehension Mode: Auditory Comprehension: 5-Follows basic conversation/direction: With no assist  Expression Expression Mode: Verbal Expression: 5-Expresses basic needs/ideas: With no assist  Social Interaction Social Interaction: 7-Interacts appropriately with others - No medications needed.  Problem Solving Problem Solving Mode: Not assessed Problem Solving: 5-Solves basic problems: With no assist  Memory Memory Mode: Not assessed Memory: 5-Recognizes or recalls 90% of the time/requires cueing < 10% of the time   Medical Problem List and Plan:  1. DVT Prophylaxis/Anticoagulation: Pharmaceutical: Lovenox  2. Pain Management: N/A  3. Mood: Provide anxiety for support. Has a supportive family. Will add nicotine patch per family request (used 1 1/2-2PPD) LCSW to follow for evaluation and support. Provide ego support.  4. Neuropsych: This patient is capable of making decisions on his own behalf.  5. HTN: will monitor with bid checks. Continue metoprolol, norvasc and catapres. Off lasix due to renal insufficiency/dysphagia diet.  6. Reactive leucocytosis: Resolving.  7. Dyslipidemia:  Continue lipitor.  8. Dysphagia: continue D1, nectar liquids. Continue to educate patient and family on compliance of current restrictions and aspiration risk. Add nocturnal fluid for hydration as appears dry.  9. Prostate cancer/bladder CA: H/o frequency. Toilet every 2-3 hours to help with continence. Condom cath at night.  10. CKD: Will monitor with routine checks. Currently at baseline.   LOS (Days) 4 A FACE TO FACE EVALUATION WAS PERFORMED  Taffy Delconte E 03/02/2013, 8:03 AM

## 2013-03-03 ENCOUNTER — Inpatient Hospital Stay (HOSPITAL_COMMUNITY): Payer: Medicare Other

## 2013-03-03 ENCOUNTER — Inpatient Hospital Stay (HOSPITAL_COMMUNITY): Payer: Medicare Other | Admitting: Occupational Therapy

## 2013-03-03 ENCOUNTER — Inpatient Hospital Stay (HOSPITAL_COMMUNITY): Payer: Medicare Other | Admitting: Rehabilitation

## 2013-03-03 ENCOUNTER — Encounter (HOSPITAL_COMMUNITY): Payer: Medicare Other | Admitting: Speech Pathology

## 2013-03-03 DIAGNOSIS — J438 Other emphysema: Secondary | ICD-10-CM | POA: Insufficient documentation

## 2013-03-03 MED ORDER — ZOLPIDEM TARTRATE 5 MG PO TABS
5.0000 mg | ORAL_TABLET | Freq: Every day | ORAL | Status: DC
  Administered 2013-03-03 – 2013-03-11 (×9): 5 mg via ORAL
  Filled 2013-03-03 (×10): qty 1

## 2013-03-03 MED ORDER — CLONIDINE HCL 0.1 MG PO TABS
0.1000 mg | ORAL_TABLET | Freq: Three times a day (TID) | ORAL | Status: DC
  Administered 2013-03-03 – 2013-03-26 (×70): 0.1 mg via ORAL
  Filled 2013-03-03 (×73): qty 1

## 2013-03-03 MED ORDER — CLONIDINE HCL 0.1 MG PO TABS
0.1000 mg | ORAL_TABLET | Freq: Three times a day (TID) | ORAL | Status: DC
  Filled 2013-03-03 (×3): qty 1

## 2013-03-03 NOTE — Progress Notes (Signed)
Patient ID: Kevin Escobar, male   DOB: 27-Jan-1941, 72 y.o.   MRN: [id number] Subjective/Complaints: 72 y.o. male with a past medical history significant for HTN, TIA, CKD, lupus, renal artery stenosis s/p stent placement, AAA repair in 1995, prostate and bladder cancer; admitted on 02/21/13 with progressive difficulty walking due to right sided weakness and fall as well and 2 episodes of transient language impairment for >24 hours PTA. MRI/MRA brain with acute left posterior limb infarct and no IC stenosis. 2D echo with EF 60-65% and No wall abnormality. Carotid dopplers with mild soft plaque L-ICA origin. Patient with neurological worsening on 07/21   Poor sleep otherwise no C/O No problems in therapy yesterday ROS Negative except for weakness on the right side, remainder of systems normal  Objective: Vital Signs: Blood pressure 151/77, pulse 71, temperature 98.3 F (36.8 C), temperature source Oral, resp. rate 18, weight 59.24 kg (130 lb 9.6 oz), SpO2 93.00%. Dg Swallowing Func-speech Pathology    Results for orders placed during the hospital encounter of 02/26/13 (from the past 72 hour(s))  CBC WITH DIFFERENTIAL     Status: Abnormal   Collection Time    03/01/13  5:15 AM      Result Value Range   WBC 8.8  4.0 - 10.5 K/uL   RBC 4.02 (*) 4.22 - 5.81 MIL/uL   Hemoglobin 12.2 (*) 13.0 - 17.0 g/dL   HCT 40.9 (*) 81.1 - 91.4 %   MCV 88.8  78.0 - 100.0 fL   MCH 30.3  26.0 - 34.0 pg   MCHC 34.2  30.0 - 36.0 g/dL   RDW 78.2  95.6 - 21.3 %   Platelets 230  150 - 400 K/uL   Neutrophils Relative % 60  43 - 77 %   Neutro Abs 5.2  1.7 - 7.7 K/uL   Lymphocytes Relative 20  12 - 46 %   Lymphs Abs 1.7  0.7 - 4.0 K/uL   Monocytes Relative 11  3 - 12 %   Monocytes Absolute 1.0  0.1 - 1.0 K/uL   Eosinophils Relative 10 (*) 0 - 5 %   Eosinophils Absolute 0.8 (*) 0.0 - 0.7 K/uL   Basophils Relative 0  0 - 1 %   Basophils Absolute 0.0  0.0 - 0.1 K/uL  COMPREHENSIVE METABOLIC PANEL     Status:  Abnormal   Collection Time    03/01/13  5:15 AM      Result Value Range   Sodium 140  135 - 145 mEq/L   Potassium 4.2  3.5 - 5.1 mEq/L   Chloride 111  96 - 112 mEq/L   CO2 18 (*) 19 - 32 mEq/L   Glucose, Bld 79  70 - 99 mg/dL   BUN 41 (*) 6 - 23 mg/dL   Creatinine, Ser 0.86 (*) 0.50 - 1.35 mg/dL   Calcium 9.0  8.4 - 57.8 mg/dL   Total Protein 6.2  6.0 - 8.3 g/dL   Albumin 2.6 (*) 3.5 - 5.2 g/dL   AST 14  0 - 37 U/L   ALT 14  0 - 53 U/L   Alkaline Phosphatase 71  39 - 117 U/L   Total Bilirubin 0.4  0.3 - 1.2 mg/dL   GFR calc non Af Amer 23 (*) >90 mL/min   GFR calc Af Amer 27 (*) >90 mL/min   Comment:            The eGFR has been calculated     using  the CKD EPI equation.     This calculation has not been     validated in all clinical     situations.     eGFR's persistently     <90 mL/min signify     possible Chronic Kidney Disease.     HEENT: Eyes not injected, oral mucosa moist, external ears normal Cardio: RRR and No murmur Resp: CTA B/L and unlabored GI: BS positive and Nontender Extremity:  Pulses positive and No Edema Skin:   Intact Neuro: Alert/Oriented, Flat, Cranial Nerve Abnormalities Right central 7, Normal Sensory, Abnormal Motor 0/5 in the right upper and right lower limb, except 1/5 R finger flexorsTone:  Hypotonia and Dysarthric Musc/Skel:  Normal General no acute distress   Assessment/Plan: 1. 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 Physiatrist is providing close team supervision and 24 hour management of active medical problems listed below. Physiatrist and rehab team continue to assess barriers to discharge/monitor patient progress toward functional and medical goals. FIM: FIM - Bathing Bathing Steps Patient Completed: Chest;Right Arm;Abdomen;Front perineal area;Right upper leg;Left upper leg Bathing: 3: Mod-Patient  completes 5-7 70f 10 parts or 50-74%  FIM - Upper Body Dressing/Undressing Upper body dressing/undressing steps patient completed: Thread/unthread left sleeve of pullover shirt/dress;Put head through opening of pull over shirt/dress Upper body dressing/undressing: 3: Mod-Patient completed 50-74% of tasks FIM - Lower Body Dressing/Undressing Lower body dressing/undressing: 1: Total-Patient completed less than 25% of tasks  FIM - Toileting Toileting: 1: Total-Patient completed zero steps, helper did all 3 (Per Bonnita Nasuti, NT)     FIM - Bed/Chair Transfer Bed/Chair Transfer Assistive Devices: Arm rests Bed/Chair Transfer: 3: Supine > Sit: Mod A (lifting assist/Pt. 50-74%/lift 2 legs;3: Sit > Supine: Mod A (lifting assist/Pt. 50-74%/lift 2 legs);2: Bed > Chair or W/C: Max A (lift and lower assist);3: Chair or W/C > Bed: Mod A (lift or lower assist)  FIM - Locomotion: Wheelchair Distance: 100' Locomotion: Wheelchair: 2: Travels 50 - 149 ft with minimal assistance (Pt.>75%) FIM - Locomotion: Ambulation Locomotion: Ambulation Assistive Devices:  (L handrail) Ambulation/Gait Assistance: 1: +2 Total assist Locomotion: Ambulation: 1: Two helpers  Comprehension Comprehension Mode: Auditory Comprehension: 6-Follows complex conversation/direction: With extra time/assistive device  Expression Expression Mode: Verbal Expression: 3-Expresses basic 50 - 74% of the time/requires cueing 25 - 50% of the time. Needs to repeat parts of sentences.  Social Interaction Social Interaction: 5-Interacts appropriately 90% of the time - Needs monitoring or encouragement for participation or interaction.  Problem Solving Problem Solving Mode: Not assessed Problem Solving: 5-Solves basic problems: With no assist  Memory Memory Mode: Not assessed Memory: 4-Recognizes or recalls 75 - 89% of the time/requires cueing 10 - 24% of the time   Medical Problem List and Plan:  1. DVT  Prophylaxis/Anticoagulation: Pharmaceutical: Lovenox  2. Pain Management: N/A  3. Mood: Provide anxiety for support. Has a supportive family. Will add nicotine patch per family request (used 1 1/2-2PPD) LCSW to follow for evaluation and support. Provide ego support.  4. Neuropsych: This patient is capable of making decisions on his own behalf.  5. HTN: will monitor with bid checks. Continue metoprolol, norvasc and catapres. Off lasix due to renal insufficiency/dysphagia diet. Mainly systolic elevation increase clonidine to TID 6. Reactive leucocytosis: Resolving.  7. Dyslipidemia: Continue lipitor.  8. Dysphagia: continue D1, nectar liquids. Continue to educate patient and family on compliance of current restrictions and aspiration  risk. Add nocturnal fluid for hydration as appears dry.  9. Prostate cancer/bladder CA: H/o frequency. Toilet every 2-3 hours to help with continence. Condom cath at night.  10. CKD: Will monitor with routine checks. Currently at baseline.   LOS (Days) 5 A FACE TO FACE EVALUATION WAS PERFORMED  KIRSTEINS,ANDREW E 03/03/2013, 9:12 AM

## 2013-03-03 NOTE — Progress Notes (Signed)
Physical Therapy Session Note  Patient Details  Name: Kevin Escobar MRN: [id number] Date of Birth: [birth year]  Today's Date: 03/03/2013 Time: 1400-1503 Time Calculation (min): 63 min  Short Term Goals: Week 1:  PT Short Term Goal 1 (Week 1): Patient will be able to perform bed mobility with Moderate assist. PT Short Term Goal 2 (Week 1): Patient will be able to perform transfers with Moderate assist. PT Short Term Goal 3 (Week 1): Patient will be able to perform gait using LRAD x 10' with Moderate assist x 2. PT Short Term Goal 4 (Week 1): Patient will be able to perform static standing balance with minimum assist. PT Short Term Goal 5 (Week 1): Patient will be able to perform dynamic standing balance with Moderate assist.  Skilled Therapeutic Interventions/Progress Updates:   Focus of session was NMR for controlled posture, standing, weight shifts and stepping.  Pt brought to ortho gym for less distraction for decreased anxiety during session.  Pt asked about family and children and pt teared up when speaking about his daughter that left today to return to [place].  He was very agreeable to perform therapy, however to get mind off of situation.  Performed stand pivot w/c <> mat at max assist with cues for hand placement, posture, foot placement and technique.  Sitting at mat focused on static sitting balance initially then progressing to dynamic balance having pt lean posteriorly then right self, forward, downward and then when lifting each leg one at a time.  Able to sustain balance with intermittent min assist due to LOB posteriorly.  Then focused on standing x 3 reps breaking down task with focus on posture, foot placement, hand placement and forward leaning equally on BLE.  Once in standing, focused on sustained upright posture, weight shifting R and L then stepping with LLE.  Pt requires several resting breaks in between activity.  Overall pt making progress and was reassured that he is  making progress in therapy.    Therapy Documentation Precautions:  Precautions Precautions: Fall Precaution Comments: right hemiparesis  Restrictions Weight Bearing Restrictions: No   Pain: Pt with no c/o pain during session.    Locomotion : Wheelchair Mobility Distance: 36'   See FIM for current functional status  Therapy/Group: Individual Therapy  Vista Deck 03/03/2013, 4:43 PM

## 2013-03-03 NOTE — Progress Notes (Signed)
Occupational Therapy Session Note  Patient Details  Name: Kevin Escobar MRN: [id number] Date of Birth: 24-Apr-1941  Today's Date: 03/03/2013 Time: 4098-1191 and 4782-9562 Time Calculation (min): 39 min and 30 min  Short Term Goals: Week 1:  OT Short Term Goal 1 (Week 1): Pt will don shirt using hemi dressing techniques with mod A  OT Short Term Goal 2 (Week 1): P t will transfer to toilet/ BSC with mod A consistently  OT Short Term Goal 3 (Week 1): Pt will demonstrate sitting balance EOB/EOM with supervision for 5 min  OT Short Term Goal 4 (Week 1): Pt will perform sit to stand with mod A for clothing management OT Short Term Goal 5 (Week 1): Pt will perform oral care with min A   Skilled Therapeutic Interventions/Progress Updates:    1) Pt seated in w/c upon arrival with nurse tech present, reporting he had been incontinent of urine and his bed was soaked.  Pt obviously flustered and requesting to return to bed.  Encouraged pt to participate in bathing and dressing session as he needed to be cleaned up and his bed had to be remade.  Pt willing to participate only if this therapist would do all the bathing.  Pt washed chest hastily and reports he just wanted to get back to bed.  Pt with deep, labored breathing but were in normal range.  Engaged in standing x2 for perineal hygiene and pulling up pants with max assist (lifting and lowering) and blocking at Rt knee and tactile cues at scapula to increase upright standing posture while this clinician pulled pants over his hips.  Pt returned to bed with all necessary items in reach.  2) Pt seen for 1:1 OT with focus on bed mobility, increased participation in mobility and LB dressing, and static sitting balance.  Pt in bed with family at bedside upon arrival.  Pt's wife and daughter had questions regarding modifications to bathroom and stairs at home.  Encouraged wife to have second rail placed for steps into house and the 3 steps up to master  bedroom.  Discussed tub transfer bench for current tub/shower and recommended leaving bathroom as is for now.  Wife did report that bathroom is quite small, she plans to measure doorway.    Engaged in bed mobility with pt bridging and advancing hips to Rt to assist in bed mobility.  Required min tactile cues to maintain unsupported midline sitting.  Focus on sitting balance while this clinician assisted with donning pants and shoes.  Pt demonstrated increased initiation and participation with attempting to pull Rt pant leg over knee and assisting in pulling up over hips.  Squat pivot transfer to w/c with max assist to promote weight shift into w/c.  Pt left seated in w/c with RUE supported on half lap tray and all necessary items in reach.  Therapy Documentation Precautions:  Precautions Precautions: Fall Precaution Comments: right hemiparesis  Restrictions Weight Bearing Restrictions: No General: General Amount of Missed OT Time (min): 21 Minutes Vital Signs: Therapy Vitals BP: 155/88 mmHg Pain:  Pt with no c/o pain this session.  See FIM for current functional status  Therapy/Group: Individual Therapy  Rosalio Loud 03/03/2013, 10:47 AM

## 2013-03-03 NOTE — Progress Notes (Signed)
Social Work Patient ID: Kevin Escobar, male   DOB: 12/30/1940, 72 y.o.   MRN: [id number]  Kevin Deck Kamen Hanken, LCSW Social Worker Signed  Patient Care Conference Service date: 03/03/2013 12:12 PM  Inpatient RehabilitationTeam Conference and Plan of Care Update Date: 03/03/2013   Time: 12:13 PM     Patient Name: Kevin Escobar       Medical Record Number: [id number]   Date of Birth: 1940/10/15 Sex: Male         Room/Bed: 4W07C/4W07C-01 Payor Info: Payor: MEDICARE / Plan: MEDICARE PART A AND B / Product Type: *No Product type* /   Admitting Diagnosis: L CVA   Admit Date/Time:  02/26/2013  4:57 PM Admission Comments: No comment available   Primary Diagnosis:  CVA (cerebral vascular accident) Principal Problem: CVA (cerebral vascular accident)    Patient Active Problem List     Diagnosis  Date Noted   .  Other emphysema  03/03/2013   .  Hypokalemia  02/22/2013   .  Depression  02/21/2013   .  PVD (peripheral vascular disease)  02/21/2013   .  CVA (cerebral vascular accident)  02/20/2013   .  Chronic kidney disease     .  Hypertension     .  TIA (transient ischemic attack)     .  Peripheral vascular disease       Expected Discharge Date: Expected Discharge Date: 03/26/13  Team Members Present: Physician leading conference: Dr. Claudette Laws Social Worker Present: Staci Acosta, LCSW Nurse Present: Ethelene Browns, RN PT Present: Wanda Plump, PT;Emily Parcell, PT;Bridgett Ripa, Mammie Lorenzo, PT OT Present: Perrin Maltese, OT;Kayla Perkinson, OT;Kris Gellert, OT;Sarah [place], OT SLP Present: Fae Pippin, SLP        Current Status/Progress  Goal  Weekly Team Focus   Medical     Severe right hemiparesis, severe dysarthria, adjustment reaction  Maximize functional and neuromuscular improvement  Improve therapy tolerance and participation   Bowel/Bladder     Incontinent of bowel and  bladder, condom cath hs and timed tolieting during the day  Continent of  bowel and bladder with min assist     Swallow/Nutrition/ Hydration     Dys.1 textures and nectar-thick liquids with water protocol and full supervision   least restrictive p.o. intake  increase arousal and trials fo Dys.2 textures    ADL's     mod assist bathing, mod assist UB dressing, total assist LB dressing, max assist transfers, mod assist sit <> stand  min assist overall  transfers, sit <> stand, dynamic sitting balance, RUE NM re-ed   Mobility     max assist for transfers, mod assist sit <> stand, max assist (+2) for ambulation for safety with wrap on RLE  min assist to supervision overall  balance, safety, w/c mobility, gait, NMR for increase use of RLE, attention to R   Communication     Mod-Max assist  Min assist  increase breath support and carryover of strengthening exercises   Safety/Cognition/ Behavioral Observations    suspect baseline       Pain     No c/o pain  Pain level < or = 3     Skin     No skin issues  No skin breakdown/infection  Monitor skin q shift for sign of breakdown/infection     *See Care Plan and progress notes for long and short-term goals.    Barriers to Discharge:  Patient having problems adjusting to his new deficits  Possible Resolutions to Barriers:    Consider neuropsych evaluation      Discharge Planning/Teaching Needs:    Pt/family are "taking one day at a time."  Pt's wife prefers that pt return home with family and HH support, but is open to SNF placement if that's what he needs after inpatient rehab.      Team Discussion:    Team discussed pt being very tired due to poor sleep and that he is not able to participate fully with therapies.  Team recommended change in schedule.   Revisions to Treatment Plan:    Changed to a 15/7 schedule.    Continued Need for Acute Rehabilitation Level of Care: The patient requires daily medical management by a physician with specialized training in physical medicine and rehabilitation for the  following conditions: Daily direction of a multidisciplinary physical rehabilitation program to ensure safe treatment while eliciting the highest outcome that is of practical value to the patient.: Yes Daily medical management of patient stability for increased activity during participation in an intensive rehabilitation regime.: Yes Daily analysis of laboratory values and/or radiology reports with any subsequent need for medication adjustment of medical intervention for : Neurological problems  Kevin Escobar, Kevin Deck 03/03/2013, 12:13 PM

## 2013-03-03 NOTE — Progress Notes (Signed)
Speech Language Pathology Daily Session Note  Patient Details  Name: Kevin Escobar MRN: [id number] Date of Birth: 02/17/1941  Today's Date: 03/03/2013 Time: 1100-1120 Time Calculation (min): 20 min  Short Term Goals: Week 1: SLP Short Term Goal 1 (Week 1): The patient will perform oral motor exercises to increase speech intelligibility and decrease aspiration risk, with min A multimodal cueing.  SLP Short Term Goal 2 (Week 1): Patient will perform pharyngeal strengthening exercises to decrease aspiration risk, with min A multimodal cueing. SLP Short Term Goal 3 (Week 1): Patient will perform diaphragmatic exercises to increase vocal intensity, with min A multimodal cueing.  SLP Short Term Goal 4 (Week 1): Patient will demonstrate sustained attention to functional tasks, in noisy environment, with supervision verbal cues for redirection, in order to increase independence in his environment.  SLP Short Term Goal 4 - Progress (Week 1): Discontinued (comment) SLP Short Term Goal 5 (Week 1): Patient will utilize compensatory strategies to increase speech intelligibility with min A verbal cues.    Skilled Therapeutic Interventions: Skilled treatment focused on swallowing and speech goals. SLP facilitated session with review of speech strategies. Pt was lethargic today and able to perform 5 towel tuck swallows with Max cues before unable to participate further. Continue plan of care     FIM:   Please see chart for complete FIM.  Pain Pain Assessment Pain Assessment: No/denies pain  Therapy/Group: Individual Therapy  Maxcine Ham 03/03/2013, 11:57 AM  Maxcine Ham, M.A. CCC-SLP

## 2013-03-03 NOTE — Plan of Care (Signed)
Problem: RH BOWEL ELIMINATION Goal: RH STG MANAGE BOWEL WITH ASSISTANCE STG Manage Bowel with min Assistance.  Outcome: Not Progressing LBM 03-02-13

## 2013-03-03 NOTE — Patient Care Conference (Signed)
Inpatient RehabilitationTeam Conference and Plan of Care Update Date: 03/03/2013   Time: 12:13 PM    Patient Name: Kevin Escobar      Medical Record Number: [id number]  Date of Birth: 10-30-1940 Sex: Male         Room/Bed: 4W07C/4W07C-01 Payor Info: Payor: MEDICARE / Plan: MEDICARE PART A AND B / Product Type: *No Product type* /    Admitting Diagnosis: L CVA  Admit Date/Time:  02/26/2013  4:57 PM Admission Comments: No comment available   Primary Diagnosis:  CVA (cerebral vascular accident) Principal Problem: CVA (cerebral vascular accident)  Patient Active Problem List   Diagnosis Date Noted  . Other emphysema 03/03/2013  . Hypokalemia 02/22/2013  . Depression 02/21/2013  . PVD (peripheral vascular disease) 02/21/2013  . CVA (cerebral vascular accident) 02/20/2013  . Chronic kidney disease   . Hypertension   . TIA (transient ischemic attack)   . Peripheral vascular disease     Expected Discharge Date: Expected Discharge Date: 03/26/13  Team Members Present: Physician leading conference: Dr. Claudette Laws Social Worker Present: Staci Acosta, LCSW Nurse Present: Ethelene Browns, RN PT Present: Wanda Plump, PT;Emily Parcell, PT;Bridgett Ripa, Mammie Lorenzo, PT OT Present: Perrin Maltese, OT;Kayla Perkinson, OT;Kris Gellert, OT;Sarah Bent, OT SLP Present: Fae Pippin, SLP     Current Status/Progress Goal Weekly Team Focus  Medical   Severe right hemiparesis, severe dysarthria, adjustment reaction  Maximize functional and neuromuscular improvement  Improve therapy tolerance and participation   Bowel/Bladder   Incontinent of bowel and  bladder, condom cath hs and timed tolieting during the day  Continent of bowel and bladder with min assist      Swallow/Nutrition/ Hydration   Dys.1 textures and nectar-thick liquids with water protocol and full supervision   least restrictive p.o. intake  increase arousal and trials fo Dys.2 textures    ADL's   mod  assist bathing, mod assist UB dressing, total assist LB dressing, max assist transfers, mod assist sit <> stand  min assist overall  transfers, sit <> stand, dynamic sitting balance, RUE NM re-ed   Mobility   max assist for transfers, mod assist sit <> stand, max assist (+2) for ambulation for safety with wrap on RLE  min assist to supervision overall  balance, safety, w/c mobility, gait, NMR for increase use of RLE, attention to R   Communication   Mod-Max assist  Min assist  increase breath support and carryover of strengthening exercises   Safety/Cognition/ Behavioral Observations  suspect baseline         Pain   No c/o pain  Pain level < or = 3      Skin   No skin issues  No skin breakdown/infection  Monitor skin q shift for sign of breakdown/infection      *See Care Plan and progress notes for long and short-term goals.  Barriers to Discharge: Patient having problems adjusting to his new deficits    Possible Resolutions to Barriers:  Consider neuropsych evaluation    Discharge Planning/Teaching Needs:  Pt/family are "taking one day at a time."  Pt's wife prefers that pt return home with family and HH support, but is open to SNF placement if that's what he needs after inpatient rehab.      Team Discussion:  Team discussed pt being very tired due to poor sleep and that he is not able to participate fully with therapies.  Team recommended change in schedule.  Revisions to Treatment Plan:  Changed to a  15/7 schedule.   Continued Need for Acute Rehabilitation Level of Care: The patient requires daily medical management by a physician with specialized training in physical medicine and rehabilitation for the following conditions: Daily direction of a multidisciplinary physical rehabilitation program to ensure safe treatment while eliciting the highest outcome that is of practical value to the patient.: Yes Daily medical management of patient stability for increased activity during  participation in an intensive rehabilitation regime.: Yes Daily analysis of laboratory values and/or radiology reports with any subsequent need for medication adjustment of medical intervention for : Neurological problems  Heidemarie Goodnow, Vista Deck 03/03/2013, 12:13 PM

## 2013-03-03 NOTE — Progress Notes (Signed)
SLP Cancellation Note  Patient Details Name: Kevin Escobar MRN: [id number] DOB: October 12, 1940   Cancelled treatment:        Patient missed 30 minutes of skilled therapy with OT/SLP due to report of fatigue and refusal to participate in [employer].    Fae Pippin, M.A., CCC-SLP [phone]  Scott Vanderveer 03/03/2013, 1:26 PM

## 2013-03-04 ENCOUNTER — Inpatient Hospital Stay (HOSPITAL_COMMUNITY): Payer: Medicare Other | Admitting: *Deleted

## 2013-03-04 ENCOUNTER — Inpatient Hospital Stay (HOSPITAL_COMMUNITY): Payer: Medicare Other

## 2013-03-04 ENCOUNTER — Inpatient Hospital Stay (HOSPITAL_COMMUNITY): Payer: Medicare Other | Admitting: Occupational Therapy

## 2013-03-04 ENCOUNTER — Inpatient Hospital Stay (HOSPITAL_COMMUNITY): Payer: Medicare Other | Admitting: Speech Pathology

## 2013-03-04 ENCOUNTER — Encounter (HOSPITAL_COMMUNITY): Payer: Medicare Other

## 2013-03-04 DIAGNOSIS — I6330 Cerebral infarction due to thrombosis of unspecified cerebral artery: Secondary | ICD-10-CM

## 2013-03-04 DIAGNOSIS — I6350 Cerebral infarction due to unspecified occlusion or stenosis of unspecified cerebral artery: Secondary | ICD-10-CM

## 2013-03-04 DIAGNOSIS — I69991 Dysphagia following unspecified cerebrovascular disease: Secondary | ICD-10-CM

## 2013-03-04 NOTE — Progress Notes (Signed)
Speech Language Pathology Daily Session Note  Patient Details  Name: Kevin Escobar MRN: [id number] Date of Birth: 1940/08/06  Today's Date: 03/04/2013 Time: 9147-8295 Time Calculation (min): 20 min  Short Term Goals: Week 1: SLP Short Term Goal 1 (Week 1): The patient will perform oral motor exercises to increase speech intelligibility and decrease aspiration risk, with min A multimodal cueing.  SLP Short Term Goal 2 (Week 1): Patient will perform pharyngeal strengthening exercises to decrease aspiration risk, with min A multimodal cueing. SLP Short Term Goal 3 (Week 1): Patient will perform diaphragmatic exercises to increase vocal intensity, with min A multimodal cueing.  SLP Short Term Goal 4 (Week 1): Patient will demonstrate sustained attention to functional tasks, in noisy environment, with supervision verbal cues for redirection, in order to increase independence in his environment.  SLP Short Term Goal 4 - Progress (Week 1): Discontinued (comment) SLP Short Term Goal 5 (Week 1): Patient will utilize compensatory strategies to increase speech intelligibility with min A verbal cues.    Skilled Therapeutic Interventions: Group, co-treatment with OT to address self-feeding and cognition; SLP facilitated session by addressing dysphagia and cognition goals. Patient consumed Dys.1 textures and nectar-thick liquids via cup with set-up assist and Min physical cues and Supervision level verbal cues to carryover swallowing compensatory strategies. SLP also facilitated session with trials of regular textures (crackers).  Patient demonstrated prolonged mastication and utilize liquid wash to assist with reducing oral residue; cough x1 suspected to be due to large sip.  Recommend trial tray upgrade of Dys.2 textures tomorrow.     FIM:  Comprehension Comprehension Mode: Auditory Comprehension: 5-Follows basic conversation/direction: With extra time/assistive device Expression Expression Mode:  Verbal Expression: 3-Expresses basic 50 - 74% of the time/requires cueing 25 - 50% of the time. Needs to repeat parts of sentences. Social Interaction Social Interaction: 4-Interacts appropriately 75 - 89% of the time - Needs redirection for appropriate language or to initiate interaction. Problem Solving Problem Solving: 4-Solves basic 75 - 89% of the time/requires cueing 10 - 24% of the time Memory Memory: 3-Recognizes or recalls 50 - 74% of the time/requires cueing 25 - 49% of the time FIM - Eating Eating Activity: 5: Set-up assist for open containers;5: Needs verbal cues/supervision  Pain Pain Assessment Pain Assessment: No/denies pain  Therapy/Group: Individual Therapy  Charlane Ferretti., CCC-SLP [phone]  [place] 03/04/2013, 1:21 PM

## 2013-03-04 NOTE — Plan of Care (Signed)
Problem: RH BOWEL ELIMINATION Goal: RH STG MANAGE BOWEL WITH ASSISTANCE STG Manage Bowel with min Assistance.  Outcome: Not Progressing LBM 02-28-13

## 2013-03-04 NOTE — Progress Notes (Signed)
Occupational Therapy Note  Patient Details  Name: Kevin Escobar MRN: [id number] Date of Birth: 1941-04-25 Today's Date: 03/04/2013  1130-1145 - 15 Minutes Co-Treatment with SLP Patient engaged in [employer] group. Patient completed lunch through self-feeding. Skilled intervention focusing on effective use of RUE; attention to right side and attempting to use RUE as a stabilizer. Therapist encouraged patient to keep RUE on table during meal for attention to RUE. At end of session, rehab tech assisted patient back to room.   Felipe Cabell 03/04/2013, 1:13 PM

## 2013-03-04 NOTE — Progress Notes (Signed)
Patient ID: DOYL BITTING, male   DOB: 01/29/1941, 72 y.o.   MRN: [id number] Subjective/Complaints: 72 y.o. male with a past medical history significant for HTN, TIA, CKD, lupus, renal artery stenosis s/p stent placement, AAA repair in 1995, prostate and bladder cancer; admitted on 02/21/13 with progressive difficulty walking due to right sided weakness and fall as well and 2 episodes of transient language impairment for >24 hours PTA. MRI/MRA brain with acute left posterior limb infarct and no IC stenosis. 2D echo with EF 60-65% and No wall abnormality. Carotid dopplers with mild soft plaque L-ICA origin. Patient with neurological worsening on 07/21   Poor sleep which he feels is due to his back pain. No problems in therapy yesterday ROS Negative except for weakness on the right side, remainder of systems normal  Objective: Vital Signs: Blood pressure 158/72, pulse 74, temperature 98 F (36.7 C), temperature source Oral, resp. rate 19, weight 59.24 kg (130 lb 9.6 oz), SpO2 93.00%. Dg Swallowing Func-speech Pathology    No results found for this or any previous visit (from the past 72 hour(s)).   HEENT: Eyes not injected, oral mucosa moist, external ears normal Cardio: RRR and No murmur Resp: CTA B/L and unlabored GI: BS positive and Nontender Extremity:  Pulses positive and No Edema Skin:   Intact Neuro: Alert/Oriented, Flat, Cranial Nerve Abnormalities Right central 7, Normal Sensory, Abnormal Motor 0/5 in the right upper and right lower limb, except 1/5 R finger flexorsTone:  Hypotonia and Dysarthric Musc/Skel:  Normal General no acute distress   Assessment/Plan: 1. 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 Physiatrist is providing close team supervision and 24 hour management of active medical problems listed below. Physiatrist and rehab team continue  to assess barriers to discharge/monitor patient progress toward functional and medical goals. FIM: FIM - Bathing Bathing Steps Patient Completed: Chest;Right Arm;Abdomen;Front perineal area Bathing: 2: Max-Patient completes 3-4 28f 10 parts or 25-49%  FIM - Upper Body Dressing/Undressing Upper body dressing/undressing steps patient completed: Thread/unthread left sleeve of pullover shirt/dress;Put head through opening of pull over shirt/dress Upper body dressing/undressing: 3: Mod-Patient completed 50-74% of tasks FIM - Lower Body Dressing/Undressing Lower body dressing/undressing: 1: Total-Patient completed less than 25% of tasks  FIM - Toileting Toileting: 1: Total-Patient completed zero steps, helper did all 3 (Per Bonnita Nasuti, NT)     FIM - Bed/Chair Transfer Bed/Chair Transfer Assistive Devices: Arm rests Bed/Chair Transfer: 2: Bed > Chair or W/C: Max A (lift and lower assist);2: Chair or W/C > Bed: Max A (lift and lower assist)  FIM - Locomotion: Wheelchair Distance: 75' Locomotion: Wheelchair: 2: Travels 50 - 149 ft with supervision, cueing or coaxing FIM - Locomotion: Ambulation Locomotion: Ambulation Assistive Devices:  (L handrail) Ambulation/Gait Assistance: 1: +2 Total assist Locomotion: Ambulation: 1: Two helpers  Comprehension Comprehension Mode: Auditory Comprehension: 5-Follows basic conversation/direction: With no assist  Expression Expression Mode: Verbal Expression: 3-Expresses basic 50 - 74% of the time/requires cueing 25 - 50% of the time. Needs to repeat parts of sentences.  Social Interaction Social Interaction: 4-Interacts appropriately 75 - 89% of the time - Needs redirection for appropriate language or to initiate interaction.  Problem Solving Problem Solving Mode: Not assessed Problem Solving: 5-Solves basic 90% of the time/requires cueing < 10% of the time  Memory Memory Mode: Not assessed Memory: 3-Recognizes or recalls 50 - 74% of the  time/requires cueing 25 -  49% of the time   Medical Problem List and Plan:  1. DVT Prophylaxis/Anticoagulation: Pharmaceutical: Lovenox  2. Pain Management: will add scheduled hs tramadol for back pain. May help with sleep as well. 3. Mood: Provide anxiety for support. Has a supportive family. Will add nicotine patch per family request (used 1 1/2-2PPD) LCSW to follow for evaluation and support. Provide ego support.  4. Neuropsych: This patient is capable of making decisions on his own behalf.  5. HTN: will monitor with bid checks. Continue metoprolol, norvasc and catapres. Off lasix due to renal insufficiency/dysphagia diet. Mainly systolic elevation increased clonidine. Continue to monitor 6. Reactive leucocytosis: Resolving.  7. Dyslipidemia: Continue lipitor.  8. Dysphagia: continue D1, nectar liquids. Continue to educate patient and family on compliance of current restrictions and aspiration risk. Add nocturnal fluid for hydration as appears dry.  9. Prostate cancer/bladder CA: H/o frequency. Toilet every 2-3 hours to help with continence. Condom cath at night.  10. CKD: Will monitor with routine checks. Currently at baseline.   LOS (Days) 6 A FACE TO FACE EVALUATION WAS PERFORMED  SWARTZ,[name] 03/04/2013, 8:59 AM

## 2013-03-04 NOTE — Progress Notes (Signed)
Social Work Patient ID: Kevin Escobar, male   DOB: 1940/12/13, 72 y.o.   MRN: [id number]  Pt was discussed in team conference on 03-03-13. Pt is extremely tired due to not sleeping well and is not able to fully participate in therapies. Dr. Wynn Banker discussed medication changes to hopefully help him to sleep better, thus being more rested to work with therapists. CSW met with pt, pt's wife, and pt's dtr (with 65 y/o granddaughter present) to update them on pt's goals and results of team conference. Team decided that pt would benefit from a 4 week rehab stay with 15/7 schedule and CSW relayed this to pt/family. Pt's dtr encouraged pt to participate fully in therapies in order for him to reach his goal of going home from inpatient rehab, rather than needing more therapy at a SNF level of care. Pt expressed understanding of this, but was slightly discouraged and irritated. Dtr returned to [place], yet she plans to return when pt is ready for d/c to help with the transition. Pt's wife is a good support, but she acknowledged with pt that she can only assist him so much with hands on, physical care, so she needs him to work hard to get stronger. CSW also reported that pt would be seen by the neuropsychologist to assess how pt is coping on 03-04-13 and family was pleased with this. CSW reviewed goals with pt and pt's wife.  Wife is comfortable with pt needing minimal assistance and feels she can provide that care.  CSW updated her on pt's goals for bowel and bladder continence and she told CSW that pt has a hx of prostate cancer and had some leaking/periods of incontinence when he coughed for the last 5 years.  Pt would use a pad and if he can get back to that, she feels she can manage him.  CSW spoke with pt's RN, Morrie Sheldon, who reports pt is doing well with timed toileting.  Pt and wife are also aware and appreciative of neuropsych consult and Leavy Cella to see pt today.  No current questions/concerns from pt/wife at  this time.  CSW will continue to assist/support the pt/family.

## 2013-03-04 NOTE — Progress Notes (Signed)
Physical Therapy Session Note  Patient Details  Name: WHITTAKER LENIS MRN: [id number] Date of Birth: 1940/12/10  Today's Date: 03/04/2013 Time: 8295-6213 Time Calculation (min): 58 min  Short Term Goals: Week 1:  PT Short Term Goal 1 (Week 1): Patient will be able to perform bed mobility with Moderate assist. PT Short Term Goal 2 (Week 1): Patient will be able to perform transfers with Moderate assist. PT Short Term Goal 3 (Week 1): Patient will be able to perform gait using LRAD x 10' with Moderate assist x 2. PT Short Term Goal 4 (Week 1): Patient will be able to perform static standing balance with minimum assist. PT Short Term Goal 5 (Week 1): Patient will be able to perform dynamic standing balance with Moderate assist.  Skilled Therapeutic Interventions/Progress Updates:    Patient received supine in bed. Session focused on functional transfers, postural control with dynamic sitting balance, and core/trunk/R LE NMR; see details below. Patient performed pre-gait activities with +2 assist (3 musketeer style); emphasis on upright posture and midline orientation. Lateral weight shifts and forward/retro stepping with L LE to facilitate: increased lateral weight shift to the R to improve advancement of L LE, increased weight bearing through R LE. Patient requires manual facilitation at pelvis to achieve upright posture and stabilization at R knee to prevent R knee buckling. Patient returned to room and left supine in bed with all needs within reach and bed alarm on.  Therapy Documentation Precautions:  Precautions Precautions: Fall Precaution Comments: right hemiparesis  Restrictions Weight Bearing Restrictions: No Pain: Pain Assessment Pain Assessment: No/denies pain Pain Score: 0-No pain Locomotion : Ambulation Ambulation/Gait Assistance: Not tested (comment) Wheelchair Mobility Distance: 100  Balance: Balance Balance Assessed: Yes Dynamic Sitting Balance Dynamic Sitting -  Balance Support: No upper extremity supported;Feet supported Dynamic Sitting - Level of Assistance: 4: Min assist;1: +1 Total assist Dynamic Sitting - Balance Activities: Lateral lean/weight shifting;Forward lean/weight shifting;Reaching for objects;Reaching across midline Dynamic Sitting - Comments: Mirror positioned in front of patient for visual feedback for postural control. Patient performed ring toss sitting edge of mat. Rings positioned at different places outside of patient's BOS. Patient able to reach significantly outside BOS without LOB when reaching to his L; when required to reach across midline to his R (with L UE), patient requires total A to return to midline.  See FIM for current functional status  Therapy/Group: Individual Therapy  Chipper Herb. Nare Gaspari, PT, DPT 03/04/2013, 4:20 PM

## 2013-03-04 NOTE — Progress Notes (Signed)
Occupational Therapy Session Note  Patient Details  Name: Kevin Escobar MRN: [id number] Date of Birth: [birth year]  Today's Date: 03/04/2013 Time: 4098-1191 Time Calculation (min): 55 min  Short Term Goals: Week 1:  OT Short Term Goal 1 (Week 1): Pt will don shirt using hemi dressing techniques with mod A  OT Short Term Goal 2 (Week 1): P t will transfer to toilet/ BSC with mod A consistently  OT Short Term Goal 3 (Week 1): Pt will demonstrate sitting balance EOB/EOM with supervision for 5 min  OT Short Term Goal 4 (Week 1): Pt will perform sit to stand with mod A for clothing management OT Short Term Goal 5 (Week 1): Pt will perform oral care with min A   Skilled Therapeutic Interventions/Progress Updates:    Pt seen for ADL retraining with focus on bed mobility, transfers, sit <> stand, and increased participation in self-care tasks of bathing and dressing.  Squat pivot transfer to w/c to Lt with mod-max assist with pt demonstrating increased initiation and participation with use of arm rest.  Bathing completed at sit <> stand level at sink with increased participation, however pt unable to reach feet.  Pt demonstrated increased initiation and participation with attempting to pull Rt pant leg over knee and assisting in pulling up over hips.  Completed grooming in sitting at sink with setup assist to apply toothpaste to toothbrush and cues to not swallow thin liquids until post oral hygiene.  Discussed water protocol and single ice chips between meals, SLP entered at end of session and discussed his questions with him.  Therapy Documentation Precautions:  Precautions Precautions: Fall Precaution Comments: right hemiparesis  Restrictions Weight Bearing Restrictions: No General:   Vital Signs: Therapy Vitals Pulse Rate: 74 Resp: 20 Pain: Pain Assessment Pain Assessment: No/denies pain Pain Score: 5  Pain Type: Acute pain Pain Location: Back Pain Orientation: Lower Pain  Descriptors / Indicators: Sore Pain Onset: Gradual Pain Intervention(s): Medication (See eMAR)  See FIM for current functional status  Therapy/Group: Individual Therapy  Rosalio Loud 03/04/2013, 12:23 PM

## 2013-03-04 NOTE — Progress Notes (Signed)
Speech Language Pathology Daily Session Note  Patient Details  Name: Kevin Escobar MRN: [id number] Date of Birth: Aug 11, 1940  Today's Date: 03/04/2013 Time: 2952-8413 Time Calculation (min): 41 min  Short Term Goals: Week 1: SLP Short Term Goal 1 (Week 1): The patient will perform oral motor exercises to increase speech intelligibility and decrease aspiration risk, with min A multimodal cueing.  SLP Short Term Goal 2 (Week 1): Patient will perform pharyngeal strengthening exercises to decrease aspiration risk, with min A multimodal cueing. SLP Short Term Goal 3 (Week 1): Patient will perform diaphragmatic exercises to increase vocal intensity, with min A multimodal cueing.  SLP Short Term Goal 4 (Week 1): Patient will demonstrate sustained attention to functional tasks, in noisy environment, with supervision verbal cues for redirection, in order to increase independence in his environment.  SLP Short Term Goal 4 - Progress (Week 1): Discontinued (comment) SLP Short Term Goal 5 (Week 1): Patient will utilize compensatory strategies to increase speech intelligibility with min A verbal cues.    Skilled Therapeutic Interventions: Skilled treatment focused on swallowing goals. SLP facilitated session with oropharyngeal strengthening exercises. Pt was lethargic and SLP provided Mod-Max cues for completion of 10 towel tuck swallows. Pt required Max visual/verbal cues to attempt tongue base retraction exercise. Continue plan of care.   FIM:  Comprehension Comprehension Mode: Auditory Comprehension: 5-Follows basic conversation/direction: With extra time/assistive device Expression Expression Mode: Verbal Expression: 3-Expresses basic 50 - 74% of the time/requires cueing 25 - 50% of the time. Needs to repeat parts of sentences. Social Interaction Social Interaction: 4-Interacts appropriately 75 - 89% of the time - Needs redirection for appropriate language or to initiate interaction. Problem  Solving Problem Solving: 4-Solves basic 75 - 89% of the time/requires cueing 10 - 24% of the time Memory Memory: 3-Recognizes or recalls 50 - 74% of the time/requires cueing 25 - 49% of the time FIM - Eating Eating Activity: 5: Supervision/cues  Pain Pain Assessment Pain Assessment: No/denies pain Pain Score: 5  Pain Type: Acute pain Pain Location: Back Pain Orientation: Lower Pain Descriptors / Indicators: Sore Pain Onset: Gradual Pain Intervention(s): Medication (See eMAR)  Therapy/Group: Individual Therapy  Maxcine Ham 03/04/2013, 11:17 AM  Maxcine Ham, M.A. CCC-SLP

## 2013-03-05 ENCOUNTER — Inpatient Hospital Stay (HOSPITAL_COMMUNITY): Payer: Medicare Other | Admitting: Occupational Therapy

## 2013-03-05 ENCOUNTER — Inpatient Hospital Stay (HOSPITAL_COMMUNITY): Payer: Medicare Other | Admitting: Speech Pathology

## 2013-03-05 ENCOUNTER — Inpatient Hospital Stay (HOSPITAL_COMMUNITY): Payer: Medicare Other | Admitting: Rehabilitation

## 2013-03-05 ENCOUNTER — Inpatient Hospital Stay (HOSPITAL_COMMUNITY): Payer: Medicare Other

## 2013-03-05 LAB — CREATININE, SERUM
Creatinine, Ser: 2.41 mg/dL — ABNORMAL HIGH (ref 0.50–1.35)
GFR calc Af Amer: 29 mL/min — ABNORMAL LOW
GFR calc non Af Amer: 25 mL/min — ABNORMAL LOW

## 2013-03-05 MED ORDER — CITALOPRAM HYDROBROMIDE 10 MG PO TABS
10.0000 mg | ORAL_TABLET | Freq: Every day | ORAL | Status: DC
  Administered 2013-03-05 – 2013-03-07 (×3): 10 mg via ORAL
  Filled 2013-03-05 (×4): qty 1

## 2013-03-05 NOTE — Progress Notes (Signed)
Occupational Therapy Weekly Progress Note  Patient Details  Name: BRELAND TROUTEN MRN: [id number] Date of Birth: [birth year]  Today's Date: 03/05/2013 Time: 0930-1028 Time Calculation (min): 58 min  Patient has met 4 of 5 short term goals.  Pt making slow progress towards goals.  Pt continues to be a max assist for squat pivot transfers, progressing to mod when transferring to Lt side.  Pt is mod assist sit to stand, min-mod assist to maintain standing requiring blocking at Rt knee and tactile cues at scapular area to promote upright standing.  Pt continues to require frequent rest breaks due to fatigue, not sleeping well at nights, and anxiety.  Pt is tolerating more time OOB.  Patient continues to demonstrate the following deficits: dominant Rt hemiplegia, impaired sequencing, impaired postural control, static and dynamic sitting balance, static standing balance and therefore will continue to benefit from skilled OT intervention to enhance overall performance with BADL and Reduce care partner burden.  Patient progressing toward long term goals..  Continue plan of care.  OT Short Term Goals Week 1:  OT Short Term Goal 1 (Week 1): Pt will don shirt using hemi dressing techniques with mod A  OT Short Term Goal 1 - Progress (Week 1): Met OT Short Term Goal 2 (Week 1): P t will transfer to toilet/ BSC with mod A consistently  OT Short Term Goal 2 - Progress (Week 1): Progressing toward goal OT Short Term Goal 3 (Week 1): Pt will demonstrate sitting balance EOB/EOM with supervision for 5 min  OT Short Term Goal 3 - Progress (Week 1): Met OT Short Term Goal 4 (Week 1): Pt will perform sit to stand with mod A for clothing management OT Short Term Goal 4 - Progress (Week 1): Met OT Short Term Goal 5 (Week 1): Pt will perform oral care with min A  OT Short Term Goal 5 - Progress (Week 1): Met Week 2:  OT Short Term Goal 1 (Week 2): Pt will transfer to toilet/ BSC with mod A consistently  OT Short  Term Goal 2 (Week 2): Pt will don pants with max assist OT Short Term Goal 3 (Week 2): Pt will wash BLE including feet with steady assist OT Short Term Goal 4 (Week 2): Pt will don shirt using hemi-technique with min assist  Skilled Therapeutic Interventions/Progress Updates:    Pt in bed upon arrival and reporting not wanting to bathe, but willing to put on clothes and participate in treatment session.  Focus on static and dynamic sitting balance, weight bearing through RUE, sit <> stand, and standing balance during reaching outside BOS.  Engaged in dressing while seated EOB, pt with Rt bias when sitting on bed - encouraged pt to scoot to Lt to be seated on flatter portion of bed.  Mod assist to facilitate weight shift with use of rail at end of bed for leverage when scooting.  Pt with increased participation when reaching for Lt pant leg to pull up over shin and knee.  Mod assist sit to stand and mod assist with blocking at Rt knee to maintain upright standing balance while pt pulled pants over Lt hip, requiring physical assistance to pull over Rt hip.  Pt propelled w/c with LLE and Lt hand ~50 feet x2 to gym.  Engaged in standing activity with blocking at Rt knee and trunk rotation to promote weight bearing through RUE.  Pt with increased flexor tone in Rt hand and wrist in standing.  Pt continues to  require tactile cues at scapula to promote upright standing.  Engaged in dynamic sitting activity with focus on midline orientation in unsupported sitting.  Pt tends to shorten Lt trunk and weight shift through Lt buttock in sitting, tactile cues at ribcage to promote midline weight bearing in sitting.  Pt reaching for rings outside BOS to focus on trunk control and weight bearing through RUE and focus on forward weight shifting to familiarize pt with position to increase participation in LB bathing and dressing and assist with transfers.  Therapy Documentation Precautions:  Precautions Precautions:  Fall Precaution Comments: right hemiparesis  Restrictions Weight Bearing Restrictions: No Pain:  Pt with no c/o pain this session. ADL: ADL Grooming: Moderate assistance Where Assessed-Grooming: Sitting at sink Upper Body Bathing: Minimal assistance Where Assessed-Upper Body Bathing: Sitting at sink Lower Body Bathing: Moderate assistance Where Assessed-Lower Body Bathing: Sitting at sink;Standing at sink Upper Body Dressing: Moderate assistance Where Assessed-Upper Body Dressing: Sitting at sink Lower Body Dressing: Dependent Where Assessed-Lower Body Dressing: Sitting at sink;Standing at sink Toilet Transfer: Maximal assistance Toilet Transfer Method: Squat pivot Toilet Transfer Equipment: Grab bars  See FIM for current functional status  Therapy/Group: Individual Therapy  Rosalio Loud 03/05/2013, 11:20 AM

## 2013-03-05 NOTE — Progress Notes (Signed)
Speech Language Pathology Daily Session Note  Patient Details  Name: Kevin Escobar MRN: [id number] Date of Birth: 1940-11-26  Today's Date: 03/05/2013 Time: 1030-1100 Time Calculation (min): 30 min  Short Term Goals: Week 1: SLP Short Term Goal 1 (Week 1): The patient will perform oral motor exercises to increase speech intelligibility and decrease aspiration risk, with min A multimodal cueing.  SLP Short Term Goal 2 (Week 1): Patient will perform pharyngeal strengthening exercises to decrease aspiration risk, with min A multimodal cueing. SLP Short Term Goal 3 (Week 1): Patient will perform diaphragmatic exercises to increase vocal intensity, with min A multimodal cueing.  SLP Short Term Goal 4 (Week 1): Patient will demonstrate sustained attention to functional tasks, in noisy environment, with supervision verbal cues for redirection, in order to increase independence in his environment.  SLP Short Term Goal 4 - Progress (Week 1): Discontinued (comment) SLP Short Term Goal 5 (Week 1): Patient will utilize compensatory strategies to increase speech intelligibility with min A verbal cues.    Skilled Therapeutic Interventions: Skilled treatment focused on dysphagia goals. SLP facilitated session with Min-Mod assist for pharyngeal strengthening exercises. Pt performed 10 towel tuck swallows and tongue retractions with frequent breaks. SLP provided set-up assist and supervision as pt performed oral care prior to consuming cup sips of water per protocol. Pt consumed approximately 4 oz of water with no overt s/s of aspiration observed. Continue plan of care.   FIM:  Comprehension Comprehension Mode: Auditory Comprehension: 5-Follows basic conversation/direction: With no assist Expression Expression Mode: Verbal Expression: 3-Expresses basic 50 - 74% of the time/requires cueing 25 - 50% of the time. Needs to repeat parts of sentences. Social Interaction Social Interaction: 4-Interacts  appropriately 75 - 89% of the time - Needs redirection for appropriate language or to initiate interaction. Problem Solving Problem Solving: 4-Solves basic 75 - 89% of the time/requires cueing 10 - 24% of the time Memory Memory: 3-Recognizes or recalls 50 - 74% of the time/requires cueing 25 - 49% of the time FIM - Eating Eating Activity: 5: Supervision/cues  Pain Pain Assessment Pain Assessment: No/denies pain  Therapy/Group: Individual Therapy  Maxcine Ham 03/05/2013, 1:16 PM  Maxcine Ham, M.A. CCC-SLP

## 2013-03-05 NOTE — Progress Notes (Signed)
Physical Therapy Session Note  Patient Details  Name: Kevin Escobar MRN: [id number] Date of Birth: [birth year]  Today's Date: 03/05/2013 Time: 4098-1191 Time Calculation (min): 58 min  Short Term Goals: Week 1:  PT Short Term Goal 1 (Week 1): Patient will be able to perform bed mobility with Moderate assist. PT Short Term Goal 2 (Week 1): Patient will be able to perform transfers with Moderate assist. PT Short Term Goal 3 (Week 1): Patient will be able to perform gait using LRAD x 10' with Moderate assist x 2. PT Short Term Goal 4 (Week 1): Patient will be able to perform static standing balance with minimum assist. PT Short Term Goal 5 (Week 1): Patient will be able to perform dynamic standing balance with Moderate assist.  Skilled Therapeutic Interventions/Progress Updates:   Pt performed supine to sit at mod assist, and pt noticed that he had a bladder accident, therefore asked RN for assist while PT stood with pt. Performed sit to/from stand x 2 reps at max assist and max cues for proper foot placement, upright posture with forward lean, and assist to block R knee to prevent buckling.  Performed squat pivot transfer to R side at mostly min to mod assist with cues for safety and technique, however when going towards L side, requires increased assist and cues for technique.  Performed tall kneeling with blue table placed in front of patient for increased WB through RLE and RUE.  Requires max assist and cues for initiating upright posture and hand placement.  Note pt requires several rest breaks down on elbows, but able to sustain upright posture with manual cues and facilitation for glute activation for approx 1-2 mins.  W/c mobility x 150 to/from gym using L UE/LE at supervision level.    Therapy Documentation Precautions:  Precautions Precautions: Fall Precaution Comments: right hemiparesis  Restrictions Weight Bearing Restrictions: No General:   Vital Signs: Therapy Vitals Temp:  97.5 F (36.4 C) Temp src: Oral Pulse Rate: 67 Resp: 18 BP: 161/90 mmHg Patient Position, if appropriate: Sitting Oxygen Therapy SpO2: 100 % O2 Device: None (Room air) Pain: Pain Assessment Pain Assessment: No/denies pain Mobility:   Locomotion : Wheelchair Mobility Distance: 150   See FIM for current functional status  Therapy/Group: Individual Therapy  Vista Deck 03/05/2013, 4:16 PM

## 2013-03-05 NOTE — Progress Notes (Signed)
Speech Language Pathology Daily Session Note  Patient Details  Name: ZYERE JIMINEZ MRN: [id number] Date of Birth: June 08, 1941  Today's Date: 03/05/2013 Time: 1130-1145 Time Calculation (min): 15 min  Short Term Goals: Week 1: SLP Short Term Goal 1 (Week 1): The patient will perform oral motor exercises to increase speech intelligibility and decrease aspiration risk, with min A multimodal cueing.  SLP Short Term Goal 2 (Week 1): Patient will perform pharyngeal strengthening exercises to decrease aspiration risk, with min A multimodal cueing. SLP Short Term Goal 3 (Week 1): Patient will perform diaphragmatic exercises to increase vocal intensity, with min A multimodal cueing.  SLP Short Term Goal 4 (Week 1): Patient will demonstrate sustained attention to functional tasks, in noisy environment, with supervision verbal cues for redirection, in order to increase independence in his environment.  SLP Short Term Goal 4 - Progress (Week 1): Discontinued (comment) SLP Short Term Goal 5 (Week 1): Patient will utilize compensatory strategies to increase speech intelligibility with min A verbal cues.    Skilled Therapeutic Interventions: Group, co-treatment with OT to address self-feeding and cognition; SLP facilitated session by addressing dysphagia and cognition goals. Patient consumed Dys.1 textures and nectar-thick liquids via cup with set-up assist and Min verbal cues to carryover swallowing compensatory strategies. SLP also facilitated session with trials of regular textures (crackers). Pt declined trials of Dys. 2 textures, therefore, recommend to continue current diet.    FIM:  Comprehension Comprehension Mode: Auditory Comprehension: 5-Follows basic conversation/direction: With no assist Expression Expression Mode: Verbal Expression: 3-Expresses basic 50 - 74% of the time/requires cueing 25 - 50% of the time. Needs to repeat parts of sentences. Social Interaction Social Interaction:  4-Interacts appropriately 75 - 89% of the time - Needs redirection for appropriate language or to initiate interaction. Problem Solving Problem Solving: 4-Solves basic 75 - 89% of the time/requires cueing 10 - 24% of the time Memory Memory: 3-Recognizes or recalls 50 - 74% of the time/requires cueing 25 - 49% of the time FIM - Eating Eating Activity: 5: Set-up assist for open containers  Pain Pain Assessment Pain Assessment: No/denies pain  Therapy/Group: Group Therapy  Milianna Ericsson 03/05/2013, 12:46 PM

## 2013-03-05 NOTE — Progress Notes (Signed)
Patient ID: Kevin Escobar, male   DOB: 05/04/1941, 72 y.o.   MRN: [id number] Subjective/Complaints:   Slept better with ultram for pain last night. Feels anxious, depressed ROS Negative except for weakness on the right side, remainder of systems normal  Objective: Vital Signs: Blood pressure 147/74, pulse 67, temperature 97.6 F (36.4 C), temperature source Oral, resp. rate 18, weight 59.24 kg (130 lb 9.6 oz), SpO2 92.00%. Dg Swallowing Func-speech Pathology    Results for orders placed during the hospital encounter of 02/26/13 (from the past 72 hour(s))  CREATININE, SERUM     Status: Abnormal   Collection Time    03/05/13  6:05 AM      Result Value Range   Creatinine, Ser 2.41 (*) 0.50 - 1.35 mg/dL   GFR calc non Af Amer 25 (*) >90 mL/min   GFR calc Af Amer 29 (*) >90 mL/min   Comment:            The eGFR has been calculated     using the CKD EPI equation.     This calculation has not been     validated in all clinical     situations.     eGFR's persistently     <90 mL/min signify     possible Chronic Kidney Disease.     HEENT: Eyes not injected, oral mucosa moist, external ears normal Cardio: RRR and No murmur Resp: CTA B/L and unlabored GI: BS positive and Nontender Extremity:  Pulses positive and No Edema Skin:   Intact Neuro: More Alert/Oriented, Flat, Cranial Nerve Abnormalities Right central 7, Normal Sensory, Abnormal Motor 0/5 in the right upper and right lower limb, except 1/5 R finger flexorsTone:  Hypotonia and Dysarthric Musc/Skel:  Normal General no acute distress   Assessment/Plan: 1. 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 Physiatrist is providing close team supervision and 24 hour management of active medical problems listed below. Physiatrist and rehab team continue to assess barriers to discharge/monitor patient  progress toward functional and medical goals. FIM: FIM - Bathing Bathing Steps Patient Completed: Chest;Right Arm;Abdomen;Front perineal area;Right upper leg;Left upper leg Bathing: 3: Mod-Patient completes 5-7 33f 10 parts or 50-74%  FIM - Upper Body Dressing/Undressing Upper body dressing/undressing steps patient completed: Thread/unthread left sleeve of pullover shirt/dress;Put head through opening of pull over shirt/dress;Pull shirt over trunk Upper body dressing/undressing: 3: Mod-Patient completed 50-74% of tasks FIM - Lower Body Dressing/Undressing Lower body dressing/undressing steps patient completed: Thread/unthread left pants leg;Pull pants up/down Lower body dressing/undressing: 1: Total-Patient completed less than 25% of tasks  FIM - Toileting Toileting: 1: Total-Patient completed zero steps, helper did all 3 (Per Bonnita Nasuti, NT)  FIM - Toilet Transfers Toilet Transfers: 0-Activity did not occur  FIM - Press photographer Assistive Devices: HOB elevated;Bed rails;Arm rests Bed/Chair Transfer: 3: Supine > Sit: Mod A (lifting assist/Pt. 50-74%/lift 2 legs;2: Bed > Chair or W/C: Max A (lift and lower assist);2: Chair or W/C > Bed: Max A (lift and lower assist);3: Sit > Supine: Mod A (lifting assist/Pt. 50-74%/lift 2 legs)  FIM - Locomotion: Wheelchair Distance: 100 Locomotion: Wheelchair: 2: Travels 50 - 149 ft with minimal assistance (Pt.>75%) FIM - Locomotion: Ambulation Locomotion: Ambulation Assistive Devices:  (L handrail) Ambulation/Gait Assistance: Not tested (comment) Locomotion: Ambulation: 0: Activity did not occur  Comprehension Comprehension Mode: Auditory Comprehension: 5-Follows basic conversation/direction: With no assist  Expression Expression  Mode: Verbal Expression: 3-Expresses basic 50 - 74% of the time/requires cueing 25 - 50% of the time. Needs to repeat parts of sentences.  Social Interaction Social Interaction: 4-Interacts  appropriately 75 - 89% of the time - Needs redirection for appropriate language or to initiate interaction.  Problem Solving Problem Solving Mode: Not assessed Problem Solving: 4-Solves basic 75 - 89% of the time/requires cueing 10 - 24% of the time  Memory Memory Mode: Not assessed Memory: 3-Recognizes or recalls 50 - 74% of the time/requires cueing 25 - 49% of the time   Medical Problem List and Plan:  1. DVT Prophylaxis/Anticoagulation: Pharmaceutical: Lovenox  2. Pain Management: will add scheduled hs tramadol for back pain. May help with sleep as well. 3. Mood: Provide anxiety/mood support. Has a supportive family. On nicotine patch  -spoke to pt and reviewed recs by psychology---will begin celexa 10mg  qhs fore reactive depression/anxiety 4. Neuropsych: This patient is capable of making decisions on his own behalf.  5. HTN: will monitor with bid checks. Continue metoprolol, norvasc and catapres. Off lasix due to renal insufficiency/dysphagia diet. Mainly systolic elevation increased clonidine. Continue to monitor 6. Reactive leucocytosis: Resolving.  7. Dyslipidemia: Continue lipitor.  8. Dysphagia: continue D1, nectar liquids. Continue to educate patient and family on compliance of current restrictions and aspiration risk. Added nocturnal fluid for hydration as appears dry.  9. Prostate cancer/bladder CA: H/o frequency. Toilet every 2-3 hours to help with continence. Condom cath at night.  10. CKD: Will monitor with routine checks. Currently at baseline.   -dc weekly CR  LOS (Days) 7 A FACE TO FACE EVALUATION WAS PERFORMED  SWARTZ,[name] 03/05/2013, 9:32 AM

## 2013-03-05 NOTE — Consult Note (Signed)
NEUROCOGNITIVE STATUS EXAMINATION - CONFIDENTIAL  Inpatient Rehabilitation   Mr. Kevin Escobar is a 72 year old man, who was seen for a neurocognitive status examination to assess his cognitive and emotional functioning post-stroke.  According to his medical record, Mr. Kevin Escobar was admitted on 02/21/13 with progressive difficulty walking due to right sided weakness and fall as well as 2 episodes of transient language impairment.  He did not receive TPA.  MRI of the brain demonstrated acute left posterior limb infarct.  His treatment team noted presence of anxiety and was informed of prior treatment for depression.  As such, he was referred for the current evaluation to assess his emotional and cognitive status and to assist in treatment planning.    Emotional Functioning:  During the clinical interview, Mr. Kevin Escobar described feeling depressed.  He mentioned that it is generally "hard to be here" and described several frustrations (e.g. the food, length of rehabilitation process, etc.).  He described frustration with lack of knowledge regarding his expectation for recovery, though he was pessimistic, stating, "I can't imagine it's very high."  [name] acknowledged seeing "some" progress, but felt frustrated by the lack of improvement in his speech and right hand movement.  He acknowledged prior history of depression, but denied suicidal ideation.  Mr. Kevin Escobar also indicated that he has significant concern about his wife having to accommodate his limitations as well as worry about his youngest daughter, given her medical condition and drug use.  Of note, he suddenly became tearful when discussing his family and he noted that this was the third such tearful episode since he came to the unit.  Aside from emotional functioning, [name] mentioned that he has had trouble sleeping (long-term, but worse since coming on the unit) with significant daytime fatigue.  He mentioned that his physicians have  prescribed medication to aid in sleep, but so far, the medications have not been helpful.    [name] responses to self-report measures of mood symptoms were suggestive of the presence of mild-moderate depression and anxiety.    Mental Status:  [name] total score on an overall measure of mental status was on the borderline that would suggest the presence of significant cognitive impairment (MMSE-2 brief = 12/16).  After immediately registering 3 words into memory, he was able to freely recall only 1 of 3 words after a brief delay.  He lost additional points for misstating the date and month.  Subjectively, he described insidious onset of symptom over the past several years, including memory difficulties, word-finding trouble, and mild difficulty concentrating.  He mentioned that certain symptoms (e.g. word-finding and concentration) seem to be worse since his stroke, but his memory seems to have remained stable.    Impressions and Recommendations:  [name] overall neurocognitive profile is suggestive of the presence of significant cognitive impairment, though it does not seem to be at the level of dementia.  He likely meets diagnostic criteria for mild cognitive impairment, though more extensive neuropsychological evaluation would be beneficial in better determining the extent of his deficits.  This should be scheduled with Dr. Wylene Simmer next week.  From an emotional standpoint, Mr. Kevin Escobar certainly seems to be experiencing clinical depression.  It may be transient as he adjusts to his medical condition, though he would likely still benefit from medication to aid in mood enhancement.  Given his report of significant worry and evidence of mild-moderate anxiety, a medication that can assist with management of both depression and anxiety (e.g. Celexa) may be  considered.  Owing to his report of difficulty sleeping, [name]'s physician may also consider adjusting medications to aid in sleep.  It  will be important for [name] to remain engaged in enjoyable activities in order to combat mood disruption and preserve cognitive abilities.  To this end, his wife should be encouraged to bring in a book that he can read during downtime on the unit.    DIAGNOSES: CVA Mixed anxiety and depressive disorder  Leavy Cella, Psy.D.  Clinical Neuropsychologist

## 2013-03-05 NOTE — Progress Notes (Addendum)
Physical Therapy Weekly Progress Note  Patient Details  Name: Kevin Escobar MRN: [id number] Date of Birth: [birth year]  Today's Date: 03/05/2013 Time: 2130-8657 Time Calculation (min): 58 min  Patient has met 1 of 5 short term goals.  Pt making progress towards all five goals, however has only met mod assist for bed mobility at this time.  Continues to have decreased strength and control in RLE in standing and requires assist to prevent buckling.  Transfers to R side are improving to approx mod assist, but is inconsistent at this time.  Pt making improvements with sitting balance, however does continue to lose balance to R side, esp in dynamic sitting balance.  Therapy to continue to focus on increased WB through RLE/UE, static and dynamic standing balance, gait, safety and attention to R side.    Patient continues to demonstrate the following deficits: R hemiplegia, decreased strength, endurance, balance, safety, R sided inattention and therefore will continue to benefit from skilled PT intervention to enhance overall performance with activity tolerance, balance, postural control, functional use of  right upper extremity and right lower extremity, attention and coordination.  Patient progressing toward long term goals..  Continue plan of care.  PT Short Term Goals Week 1:  PT Short Term Goal 1 (Week 1): Patient will be able to perform bed mobility with Moderate assist. PT Short Term Goal 1 - Progress (Week 1): Met PT Short Term Goal 2 (Week 1): Patient will be able to perform transfers with Moderate assist. PT Short Term Goal 2 - Progress (Week 1): Progressing toward goal PT Short Term Goal 3 (Week 1): Patient will be able to perform gait using LRAD x 10' with Moderate assist x 2. PT Short Term Goal 3 - Progress (Week 1): Progressing toward goal PT Short Term Goal 4 (Week 1): Patient will be able to perform static standing balance with minimum assist. PT Short Term Goal 4 - Progress (Week 1):  Progressing toward goal PT Short Term Goal 5 (Week 1): Patient will be able to perform dynamic standing balance with Moderate assist. PT Short Term Goal 5 - Progress (Week 1): Progressing toward goal Week 2:  PT Short Term Goal 1 (Week 2): Pt will perform bed mobility with min assist and min cuing for technique.  PT Short Term Goal 2 (Week 2): Pt will perform transfers at moderate assist level.  PT Short Term Goal 3 (Week 2): Patient will be able to perform dynamic standing balance with Moderate assist. PT Short Term Goal 4 (Week 2): Patient will be able to perform static standing balance with minimum assist. PT Short Term Goal 5 (Week 2): Patient will be able to perform gait using LRAD x 10' with Moderate assist x 2.  Skilled Therapeutic Interventions/Progress Updates:    See separate treatment note for intervention.   Therapy Documentation Precautions:  Precautions Precautions: Fall Precaution Comments: right hemiparesis  Restrictions Weight Bearing Restrictions: No General:   Vital Signs: Therapy Vitals Temp: 97.5 F (36.4 C) Temp src: Oral Pulse Rate: 67 Resp: 18 BP: 161/90 mmHg Patient Position, if appropriate: Sitting Oxygen Therapy SpO2: 100 % O2 Device: None (Room air) Pain: Pain Assessment Pain Assessment: No/denies pain    Locomotion : Wheelchair Mobility Distance: 150    Vista Deck 03/05/2013, 4:29 PM

## 2013-03-05 NOTE — Plan of Care (Signed)
Problem: RH BOWEL ELIMINATION Goal: RH STG MANAGE BOWEL WITH ASSISTANCE STG Manage Bowel with min Assistance.  Outcome: Not Progressing LBM 02-28-13     

## 2013-03-06 ENCOUNTER — Inpatient Hospital Stay (HOSPITAL_COMMUNITY): Payer: Medicare Other

## 2013-03-06 ENCOUNTER — Inpatient Hospital Stay (HOSPITAL_COMMUNITY): Payer: Medicare Other | Admitting: Speech Pathology

## 2013-03-06 ENCOUNTER — Inpatient Hospital Stay (HOSPITAL_COMMUNITY): Payer: Medicare Other | Admitting: Physical Therapy

## 2013-03-06 DIAGNOSIS — I739 Peripheral vascular disease, unspecified: Secondary | ICD-10-CM

## 2013-03-06 DIAGNOSIS — I10 Essential (primary) hypertension: Secondary | ICD-10-CM

## 2013-03-06 NOTE — Progress Notes (Signed)
Physical Therapy Note  Patient Details  Name: Kevin Escobar MRN: [id number] Date of Birth: 1941-01-28 Today's Date: 03/06/2013  1430-1525 (55 minutes) individual Pain: no complaint of pain Focus of treatment: Neuro re-ed RT LE Treatment: Pt up in wc upon arrival; transfers stand/turn mod assist ; sit to supine (mat) min assist RT LE supine to side to sit min assist RT LE/trunk; Neuro re-ed RT LE in gravity eliminated sidelying position using suspension grid - pt able to actively initiate Rt hip flexion post quick stretch facilitation but movement quickly fatigues; supine hip abduction (gravity eliminated) minimal activation; bridging X 10; RT hip ER/IR in hooklying.    Ruchy Wildrick,[name] 03/06/2013, 3:39 PM

## 2013-03-06 NOTE — Progress Notes (Signed)
Physical Therapy Session Note  Patient Details  Name: Kevin Escobar MRN: [id number] Date of Birth: [birth year]  Today's Date: 03/06/2013 Time: 1030-1118 Time Calculation (min): 48 min  Short Term Goals: Week 2:  PT Short Term Goal 1 (Week 2): Pt will perform bed mobility with min assist and min cuing for technique.  PT Short Term Goal 2 (Week 2): Pt will perform transfers at moderate assist level.  PT Short Term Goal 3 (Week 2): Patient will be able to perform dynamic standing balance with Moderate assist. PT Short Term Goal 4 (Week 2): Patient will be able to perform static standing balance with minimum assist. PT Short Term Goal 5 (Week 2): Patient will be able to perform gait using LRAD x 10' with Moderate assist x 2.  Pt presents with increased lethargy today - pt attributes it to new medication (sleeping medication last night vs. Antidepressant) and RN aware. Vitals taken were stable (BP = 124/80).   Skilled Therapeutic Interventions/Progress Updates:    Session focused on functional transfers with mod A and cues for technique, neuro re-ed for dynamic sitting balance, sit to stands, and standing balance/weight shifting, and w/c propulsion. Pt completed sit to stands in parallel bars with min A and facilitation at R knee for extension and for equal weightbearing for pre-gait. Pt with limited standing tolerance due to lethargy and reports of feeling "woozy." RN aware.  Therapy Documentation Precautions:  Precautions Precautions: Fall Precaution Comments: right hemiparesis  Restrictions Weight Bearing Restrictions: No   Pain:  Denies pain.  See FIM for current functional status  Therapy/Group: Individual Therapy  Karolee Stamps [facility] 03/06/2013, 11:22 AM

## 2013-03-06 NOTE — Progress Notes (Signed)
Kevin Escobar is a 72 y.o. male 23-Apr-1941 [id number]  Subjective: No new complaints. No new problems. Slept well. Feeling OK.  Objective: Vital signs in last 24 hours: Temp:  [97.4 F (36.3 C)-97.5 F (36.4 C)] 97.4 F (36.3 C) (08/02 0521) Pulse Rate:  [65-67] 67 (08/02 0521) Resp:  [17-18] 17 (08/02 0521) BP: (133-171)/(55-95) 171/95 mmHg (08/02 0521) SpO2:  [97 %-100 %] 97 % (08/02 0521) Weight change:  Last BM Date: 03/05/13 (after suppository)  Intake/Output from previous day: 08/01 0701 - 08/02 0700 In: 480 [P.O.:480] Out: 650 [Urine:650] Last cbgs: CBG (last 3)  No results found for this basename: GLUCAP,  in the last 72 hours   Physical Exam General: No apparent distress    HEENT: moist mucosa Lungs: Normal effort. Lungs clear to auscultation, no crackles or wheezes. Cardiovascular: Regular rate and rhythm, no edema Musculoskeletal:  No change from before Neurological: No new neurological deficits Wounds: N/A    Skin: clear Alert, cooperative   Lab Results: BMET    Component Value Date/Time   NA 140 03/01/2013 0515   K 4.2 03/01/2013 0515   CL 111 03/01/2013 0515   CO2 18* 03/01/2013 0515   GLUCOSE 79 03/01/2013 0515   BUN 41* 03/01/2013 0515   CREATININE 2.41* 03/05/2013 0605   CREATININE 2.57* 09/30/2012 1617   CALCIUM 9.0 03/01/2013 0515   GFRNONAA 25* 03/05/2013 0605   GFRAA 29* 03/05/2013 0605   CBC    Component Value Date/Time   WBC 8.8 03/01/2013 0515   RBC 4.02* 03/01/2013 0515   HGB 12.2* 03/01/2013 0515   HCT 35.7* 03/01/2013 0515   PLT 230 03/01/2013 0515   MCV 88.8 03/01/2013 0515   MCH 30.3 03/01/2013 0515   MCHC 34.2 03/01/2013 0515   RDW 13.9 03/01/2013 0515   LYMPHSABS 1.7 03/01/2013 0515   MONOABS 1.0 03/01/2013 0515   EOSABS 0.8* 03/01/2013 0515   BASOSABS 0.0 03/01/2013 0515    Studies/Results: No results found.  Medications: I have reviewed the patient's current medications.  Assessment/Plan:   1. DVT Prophylaxis/Anticoagulation:  Pharmaceutical: Lovenox  2. Pain Management: will add scheduled hs tramadol for back pain. May help with sleep as well.  3. Mood: Provide anxiety/mood support. Has a supportive family. On nicotine patch  -spoke to pt and reviewed recs by psychology---will begin celexa 10mg  qhs fore reactive depression/anxiety  4. Neuropsych: This patient is capable of making decisions on his own behalf.  5. HTN: will monitor with bid checks. Continue metoprolol, norvasc and catapres. Off lasix due to renal insufficiency/dysphagia diet. Mainly systolic elevation increased clonidine. Continue to monitor  6. Reactive leucocytosis: Resolving.  7. Dyslipidemia: Continue lipitor.  8. Dysphagia: continue D1, nectar liquids. Continue to educate patient and family on compliance of current restrictions and aspiration risk. Added nocturnal fluid for hydration as appears dry.  9. Prostate cancer/bladder CA: H/o frequency. Toilet every 2-3 hours to help with continence. Condom cath at night.  10. CKD: Will monitor with routine checks. Currently at baseline.  -d/c weekly CR   Cont Rx   Length of stay, days: 8  Sonda Primes , MD 03/06/2013, 12:06 PM

## 2013-03-06 NOTE — Progress Notes (Signed)
Speech Language Pathology Daily Session Note  Patient Details  Name: [name] MRN: [id number] Date of Birth: Nov 09, 1940  Today's Date: 03/06/2013 Time: 1145-1200 Time Calculation (min): 15 min  Short Term Goals: Week 1: SLP Short Term Goal 1 (Week 1): The patient will perform oral motor exercises to increase speech intelligibility and decrease aspiration risk, with min A multimodal cueing.  SLP Short Term Goal 2 (Week 1): Patient will perform pharyngeal strengthening exercises to decrease aspiration risk, with min A multimodal cueing. SLP Short Term Goal 3 (Week 1): Patient will perform diaphragmatic exercises to increase vocal intensity, with min A multimodal cueing.  SLP Short Term Goal 4 (Week 1): Patient will demonstrate sustained attention to functional tasks, in noisy environment, with supervision verbal cues for redirection, in order to increase independence in his environment.  SLP Short Term Goal 4 - Progress (Week 1): Discontinued (comment) SLP Short Term Goal 5 (Week 1): Patient will utilize compensatory strategies to increase speech intelligibility with min A verbal cues.    Skilled Therapeutic Interventions: Group, co-treatment with OT to address dysphagia goals; Attempted to consume trials of Dys. 2 textures, however, the wrong diet texture was sent up from the kitchen for the second day in a row. Pt again declined to participate in any other trials or order a new tray. Pt reported lethargy and felling "woozy" and was unable to eat. Pt contributed not feeling well to the new medication he started yesterday. RN aware.    FIM:  Comprehension Comprehension Mode: Auditory Comprehension: 5-Follows basic conversation/direction: With no assist Expression Expression Mode: Verbal Expression: 3-Expresses basic 50 - 74% of the time/requires cueing 25 - 50% of the time. Needs to repeat parts of sentences. Social Interaction Social Interaction: 4-Interacts appropriately 75 - 89% of  the time - Needs redirection for appropriate language or to initiate interaction. Problem Solving Problem Solving: 4-Solves basic 75 - 89% of the time/requires cueing 10 - 24% of the time Memory Memory: 3-Recognizes or recalls 50 - 74% of the time/requires cueing 25 - 49% of the time FIM - Eating Eating Activity: 5: Supervision/cues  Pain Pain Assessment Pain Assessment: No/denies pain  Therapy/Group: Group Therapy  Kevin Escobar 03/06/2013, 1:15 PM

## 2013-03-06 NOTE — Progress Notes (Addendum)
Occupational Therapy Session Note  Patient Details  Name: MARQUINN MESCHKE MRN: [id number] Date of Birth: 17-Jan-1941  Today's Date: 03/06/2013 Time: 1130-1145 Time Calculation (min): 15 min  Skilled Therapeutic Interventions/Progress Updates: Patient came to [employer] but when he attempted to self food, he stated he was extremely dizzy when he attempted to self feed.  Patient attempted to stay for the complete session, but decided that he needed to ly down to  Try to combat the dizziness.      Therapy Documentation Precautions:  Precautions Precautions: Fall Precaution Comments: right hemiparesis  Restrictions Weight Bearing Restrictions: No   Pain: Pain Assessment Pain Assessment: No/denies pain  See FIM for current functional status  Therapy/Group: Group Therapy  Rozelle Logan 03/06/2013, 4:10 PM

## 2013-03-07 ENCOUNTER — Inpatient Hospital Stay (HOSPITAL_COMMUNITY): Payer: Medicare Other | Admitting: Occupational Therapy

## 2013-03-07 ENCOUNTER — Inpatient Hospital Stay (HOSPITAL_COMMUNITY): Payer: Medicare Other | Admitting: *Deleted

## 2013-03-07 ENCOUNTER — Inpatient Hospital Stay (HOSPITAL_COMMUNITY): Payer: Medicare Other

## 2013-03-07 DIAGNOSIS — F329 Major depressive disorder, single episode, unspecified: Secondary | ICD-10-CM

## 2013-03-07 NOTE — Progress Notes (Signed)
Physical Therapy Note  Patient Details  Name: Kevin Escobar MRN: [id number] Date of Birth: 1941-02-28 Today's Date: 03/07/2013  3:00 - 3:40 40 minutes Individual session. Patient denies pain.  Patient resting in bed upon entering room. Patient reports "feeling lousy" today but willing to work with PT. Patient supine to sit with min assist. Patient transferred bed > wheelchair > mat with mod assist. Patient sit to supine with mod assist. Patient used suspension grid for neuromuscular re-education of hip flexion and knee flexion/extension in side lying and hip ab/adduction in supine. Patient supine to sit with min assist. Patient sit to stand with mod assist. Patient stepped forward and back with left LE working on stance on right. Patient requires max assist to facilitate hip and knee extension on right. Patient returned to room and left in wheelchair with items in reach, wife in room, and quick release belt in place.   Arelia Longest M 03/07/2013, 3:46 PM

## 2013-03-07 NOTE — Progress Notes (Signed)
Occupational Therapy Session Note  Patient Details  Name: Kevin Escobar MRN: [id number] Date of Birth: May 01, 1941  Today's Date: 03/07/2013 Time: 1005-1105 Time Calculation (min): 60 min  Skilled Therapeutic Interventions/Progress Updates:    AM session:     Though patient was slow to arouse, he participated in skilled OT to address self care, neurological functional tasks and  Cognition as follows:   Due to patient being "slow to wake up and get going," right hemiparesis and decreased cogntive function, patient required overall Mod A for self care.  He exhibited very low motor strength in his R UE, except approximately trace motor function in right anterior and middle deltoid.  Patient required  Min to Mod A for standing at sink while clinician cleansed periarea and pulled up pants.  Patient this morning too groggy to safely maintain enough balance to utilize L UE to asssist with the task in standing.      Therapy Documentation Precautions:  Precautions Precautions: Fall Precaution Comments: right hemiparesis  Restrictions Weight Bearing Restrictions: No Therapy Vitals Temp: 97.5 F (36.4 C) Temp src: Oral Pulse Rate: 63 Resp: 16 BP: 124/58 mmHg Patient Position, if appropriate: Lying Oxygen Therapy SpO2: 97 % O2 Device: None (Room air)  Pain: "back inside my head"  4/10 but did not want pain meds because "They will only give me Tylenol for it."   See FIM for current functional status  Therapy/Group: Individual Therapy  Bud Face [facility] 03/07/2013, 3:49 PM

## 2013-03-07 NOTE — Plan of Care (Signed)
Problem: RH BLADDER ELIMINATION Goal: RH STG MANAGE BLADDER WITH ASSISTANCE STG Manage Bladder With max Assistance timed toileting  Outcome: Progressing Timed toileting during day remains cont, condom cath at [facility]

## 2013-03-07 NOTE — Progress Notes (Signed)
Occupational Therapy Session Note  Patient Details  Name: Kevin Escobar MRN: [id number] Date of Birth: Sep 28, 1940  Today's Date: 03/07/2013 Time: 1300-1330 Time Calculation (min): 30 min  Skilled Therapeutic Interventions/Progress Updates: Patient participated in R UE neuro reducation and exhibited trace R shoulder anterior and middle deltoid muscle movement strength.  He dosed on and off during the session and stated  He did not sleep well last night.  This clinician assisted patient with lying down at the end of the session to rest before his next session.  He required Min to Mod A for the transfer.  Call bell and alarm were left in place with the patient.     Therapy Documentation Precautions:  Precautions Precautions: Fall Precaution Comments: right hemiparesis  Restrictions Weight Bearing Restrictions: No  Pain: Pain Assessment Pain Assessment: No/denies pain See FIM for current functional status  Therapy/Group: Individual Therapy  Bud Face [facility] 03/07/2013, 3:57 PM

## 2013-03-07 NOTE — Plan of Care (Signed)
Problem: RH BOWEL ELIMINATION Goal: RH STG MANAGE BOWEL WITH ASSISTANCE STG Manage Bowel with min Assistance.  Outcome: Not Progressing LBM 8/1 after suppository, will require laxative today 2 days no BM

## 2013-03-07 NOTE — Progress Notes (Signed)
Kevin Escobar is a 72 y.o. male 1941/02/26 [id number]  Subjective: No new complaints. No new problems. Slept well.  Objective: Vital signs in last 24 hours: Temp:  [97.5 F (36.4 C)-97.9 F (36.6 C)] 97.9 F (36.6 C) (08/03 0547) Pulse Rate:  [62-63] 62 (08/03 0547) Resp:  [17-18] 17 (08/03 0547) BP: (123-156)/(71-76) 156/73 mmHg (08/03 0547) SpO2:  [98 %-99 %] 98 % (08/03 0547) Weight change:  Last BM Date: 03/05/13  Intake/Output from previous day: 08/02 0701 - 08/03 0700 In: 510 [P.O.:510] Out: 1100 [Urine:1100] Last cbgs: CBG (last 3)  No results found for this basename: GLUCAP,  in the last 72 hours   Physical Exam General: No apparent distress    HEENT: moist mucosa Lungs: Normal effort. Lungs clear to auscultation, no crackles or wheezes. Cardiovascular: Regular rate and rhythm, no edema Musculoskeletal:  No change from before Neurological: No new neurological deficits Wounds: N/A    Skin: clear Alert, cooperative   Lab Results: BMET    Component Value Date/Time   NA 140 03/01/2013 0515   K 4.2 03/01/2013 0515   CL 111 03/01/2013 0515   CO2 18* 03/01/2013 0515   GLUCOSE 79 03/01/2013 0515   BUN 41* 03/01/2013 0515   CREATININE 2.41* 03/05/2013 0605   CREATININE 2.57* 09/30/2012 1617   CALCIUM 9.0 03/01/2013 0515   GFRNONAA 25* 03/05/2013 0605   GFRAA 29* 03/05/2013 0605   CBC    Component Value Date/Time   WBC 8.8 03/01/2013 0515   RBC 4.02* 03/01/2013 0515   HGB 12.2* 03/01/2013 0515   HCT 35.7* 03/01/2013 0515   PLT 230 03/01/2013 0515   MCV 88.8 03/01/2013 0515   MCH 30.3 03/01/2013 0515   MCHC 34.2 03/01/2013 0515   RDW 13.9 03/01/2013 0515   LYMPHSABS 1.7 03/01/2013 0515   MONOABS 1.0 03/01/2013 0515   EOSABS 0.8* 03/01/2013 0515   BASOSABS 0.0 03/01/2013 0515    Studies/Results: No results found.  Medications: I have reviewed the patient's current medications.  Assessment/Plan:   1. DVT Prophylaxis/Anticoagulation: Pharmaceutical: Lovenox  2.  Pain Management: will add scheduled hs tramadol for back pain. May help with sleep as well.  3. Mood: Provide anxiety/mood support. Has a supportive family. On nicotine patch  -spoke to pt and reviewed recs by psychology---will begin celexa 10mg  qhs fore reactive depression/anxiety  4. Neuropsych: This patient is capable of making decisions on his own behalf.  5. HTN: will monitor with bid checks. Continue metoprolol, norvasc and catapres. Off lasix due to renal insufficiency/dysphagia diet. Mainly systolic elevation increased clonidine. Continue to monitor  6. Reactive leucocytosis: Resolving.  7. Dyslipidemia: Continue lipitor.  8. Dysphagia: continue D1, nectar liquids. Continue to educate patient and family on compliance of current restrictions and aspiration risk. Added nocturnal fluid for hydration as appears dry.  9. Prostate cancer/bladder CA: H/o frequency. Toilet every 2-3 hours to help with continence. Condom cath at night.  10. CKD: Will monitor with routine checks. Currently at baseline.  -d/c weekly CR   Cont Rx   Length of stay, days: 9  Sonda Primes , MD 03/07/2013, 8:57 AM

## 2013-03-07 NOTE — Progress Notes (Signed)
Occupational Therapy Note  Patient Details  Name: Kevin Escobar MRN: [id number] Date of Birth: [birth year] Today's Date: 03/07/2013  Time:  1045-1200(53min) Individual session Pain:  3/10 wrist pain with stretching  Focus of treatment was bed mobility, transfers,  Neuro-muscular reeducation, sitting balance, standing balance, attention, therapeutic activities,  postural control .  Pt. Just completed bathing and dressing with other OT.  Pt. Agreed to work with OT.  Transferred from wc to mat with mod assist with stand pivot technique.  Performed RUE PROM, NMRE, weight bearing through Right, scapular retraction/protraction, pelvic mobility.  Went to supine, but pt with increased SOB so went back to sitting.  Educated pt on pursed lip breathing, but pt stated it made him more uptight so he retuned to normal breathing.   Stood for 1 minute with cues to lean forward and push up with legs.  Pt. Stood agin  for 60 seconds with minimal assist for balance.  Transferred mat to wc with mod assist.  Taken to room.  Pt. Brushed teeth and OT monitored with water protocal.  Left in room with friend, Mosetta Putt and safety belt applied.  Humberto Seals 03/07/2013, 11:17 AM

## 2013-03-08 ENCOUNTER — Inpatient Hospital Stay (HOSPITAL_COMMUNITY): Payer: Medicare Other | Admitting: Rehabilitation

## 2013-03-08 ENCOUNTER — Inpatient Hospital Stay (HOSPITAL_COMMUNITY): Payer: Medicare Other | Admitting: Speech Pathology

## 2013-03-08 ENCOUNTER — Inpatient Hospital Stay (HOSPITAL_COMMUNITY): Payer: Medicare Other | Admitting: Occupational Therapy

## 2013-03-08 MED ORDER — ESCITALOPRAM OXALATE 5 MG PO TABS
5.0000 mg | ORAL_TABLET | Freq: Every day | ORAL | Status: DC
  Administered 2013-03-08 – 2013-03-25 (×18): 5 mg via ORAL
  Filled 2013-03-08 (×20): qty 1

## 2013-03-08 NOTE — Progress Notes (Addendum)
Speech Language Pathology Daily Session Note  Patient Details  Name: Kevin Escobar MRN: [id number] Date of Birth: 1941-05-24  Today's Date: 03/08/2013 Time: 1130-1145 Time Calculation (min): 15 min  Short Term Goals: Week 1: SLP Short Term Goal 1 (Week 1): The patient will perform oral motor exercises to increase speech intelligibility and decrease aspiration risk, with min A multimodal cueing.  SLP Short Term Goal 2 (Week 1): Patient will perform pharyngeal strengthening exercises to decrease aspiration risk, with min A multimodal cueing. SLP Short Term Goal 3 (Week 1): Patient will perform diaphragmatic exercises to increase vocal intensity, with min A multimodal cueing.  SLP Short Term Goal 4 (Week 1): Patient will demonstrate sustained attention to functional tasks, in noisy environment, with supervision verbal cues for redirection, in order to increase independence in his environment.  SLP Short Term Goal 4 - Progress (Week 1): Discontinued (comment) SLP Short Term Goal 5 (Week 1): Patient will utilize compensatory strategies to increase speech intelligibility with min A verbal cues.    Skilled Therapeutic Interventions: Group, co-treatment with OT to address self-feeding; SLP facilitated session by addressing dysphagia goals. Patient consumed upgrade of Dys.2 textures and nectar-thick liquids with set-up assist and Supervision level verbal cues to carryover swallowing compensatory strategies. Patient demonstrated no over s/s of aspiration throughout meal; as a result it is recommended to upgrade diet to Dys.2 textures with continued need for full staff supervision.      FIM:  Comprehension Comprehension Mode: Auditory Comprehension: 5-Follows basic conversation/direction: With no assist Expression Expression Mode: Verbal Expression: 3-Expresses basic 50 - 74% of the time/requires cueing 25 - 50% of the time. Needs to repeat parts of sentences. Social Interaction Social Interaction:  4-Interacts appropriately 75 - 89% of the time - Needs redirection for appropriate language or to initiate interaction. Problem Solving Problem Solving: 5-Solves basic 90% of the time/requires cueing < 10% of the time Memory Memory: 4-Recognizes or recalls 75 - 89% of the time/requires cueing 10 - 24% of the time FIM - Eating Eating Activity: 5: Set-up assist for open containers;5: Needs verbal cues/supervision  Pain Pain Assessment Pain Assessment: No/denies pain  Therapy/Group: Group Therapy  Charlane Ferretti., CCC-SLP [phone]  Kevin Escobar 03/08/2013, 1:51 PM

## 2013-03-08 NOTE — Progress Notes (Signed)
Patient ID: Kevin Escobar, male   DOB: 23-Jun-1941, 72 y.o.   MRN: [id number] Subjective/Complaints:   Feels that celexa is making him feel groggy in the am. Usually by the afternoon it wears off ROS Negative except for weakness on the right side, remainder of systems normal  Objective: Vital Signs: Blood pressure 155/80, pulse 60, temperature 97.9 F (36.6 C), temperature source Oral, resp. rate 17, weight 59.24 kg (130 lb 9.6 oz), SpO2 94.00%. Dg Swallowing Func-speech Pathology    No results found for this or any previous visit (from the past 72 hour(s)).   HEENT: Eyes not injected, oral mucosa moist, external ears normal Cardio: RRR and No murmur Resp: CTA B/L and unlabored GI: BS positive and Nontender Extremity:  Pulses positive and No Edema Skin:   Intact Neuro: More Alert/Oriented, Flat, Cranial Nerve Abnormalities Right central 7, Normal Sensory, Abnormal Motor tr to 1/5 in the right upper except 1/5 R finger flexors. RLE tr in HF, KE. 0/5 distally. Tone:  Hypotonia and Dysarthria continues Musc/Skel:  Normal General no acute distress   Assessment/Plan: 1. 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 Physiatrist is providing close team supervision and 24 hour management of active medical problems listed below. Physiatrist and rehab team continue to assess barriers to discharge/monitor patient progress toward functional and medical goals. FIM: FIM - Bathing Bathing Steps Patient Completed: Chest;Right Arm;Abdomen;Front perineal area;Right upper leg;Left upper leg Bathing: 3: Mod-Patient completes 5-7 17f 10 parts or 50-74%  FIM - Upper Body Dressing/Undressing Upper body dressing/undressing steps patient completed: Thread/unthread left sleeve of pullover shirt/dress;Pull shirt over trunk Upper body dressing/undressing: 3: Mod-Patient completed  50-74% of tasks FIM - Lower Body Dressing/Undressing Lower body dressing/undressing steps patient completed: Thread/unthread left pants leg;Pull pants up/down Lower body dressing/undressing: 1: Total-Patient completed less than 25% of tasks  FIM - Toileting Toileting: 0: Activity did not occur  FIM - Archivist Transfers: 0-Activity did not occur  FIM - Banker Devices: Bed rails;Arm rests Bed/Chair Transfer: 3: Bed > Chair or W/C: Mod A (lift or lower assist);3: Chair or W/C > Bed: Mod A (lift or lower assist)  FIM - Locomotion: Wheelchair Distance: 150 Locomotion: Wheelchair: 2: Travels 50 - 149 ft with minimal assistance (Pt.>75%) FIM - Locomotion: Ambulation Locomotion: Ambulation Assistive Devices:  (L handrail) Ambulation/Gait Assistance: Not tested (comment) Locomotion: Ambulation: 0: Activity did not occur  Comprehension Comprehension Mode: Auditory Comprehension: 5-Follows basic conversation/direction: With no assist  Expression Expression Mode: Verbal Expression: 3-Expresses basic 50 - 74% of the time/requires cueing 25 - 50% of the time. Needs to repeat parts of sentences.  Social Interaction Social Interaction Mode: Asleep Social Interaction: 4-Interacts appropriately 75 - 89% of the time - Needs redirection for appropriate language or to initiate interaction.  Problem Solving Problem Solving Mode: Asleep Problem Solving: 4-Solves basic 75 - 89% of the time/requires cueing 10 - 24% of the time  Memory Memory Mode: Asleep Memory: 3-Recognizes or recalls 50 - 74% of the time/requires cueing 25 - 49% of the time   Medical Problem List and Plan:  1. DVT Prophylaxis/Anticoagulation: Pharmaceutical: Lovenox  2. Pain Management: will add scheduled hs tramadol for back pain. May help with sleep as well. 3. Mood: Provide anxiety/mood support. Has a supportive family. On nicotine patch  -will change to lexapro 5mg   and give earlier in evening to reduce am  sedation 4. Neuropsych: This patient is capable of making decisions on his own behalf.  5. HTN: will monitor with bid checks. Continue metoprolol, norvasc and catapres. Off lasix due to renal insufficiency/dysphagia diet. Mainly systolic elevation. Clonidine recently adjusted. No changes at  present 6. Reactive leucocytosis: Resolving.  7. Dyslipidemia: Continue lipitor.  8. Dysphagia: continue D1, nectar liquids. Continue to educate patient and family on compliance of current restrictions and aspiration risk. Added nocturnal fluid for hydration as appears dry.  9. Prostate cancer/bladder CA: H/o frequency. Toilet every 2-3 hours to help with continence. Condom cath at night.  10. CKD: Will monitor with routine checks. Currently at baseline.   -dc weekly CR  LOS (Days) 10 A FACE TO FACE EVALUATION WAS PERFORMED  SWARTZ,[name] 03/08/2013, 9:24 AM

## 2013-03-08 NOTE — Progress Notes (Signed)
Physical Therapy Session Note  Patient Details  Name: Kevin Escobar MRN: [id number] Date of Birth: 12/08/1940  Today's Date: 03/08/2013 Time: 1302-1400 Time Calculation (min): 58 min  Short Term Goals: Week 2:  PT Short Term Goal 1 (Week 2): Pt will perform bed mobility with min assist and min cuing for technique.  PT Short Term Goal 2 (Week 2): Pt will perform transfers at moderate assist level.  PT Short Term Goal 3 (Week 2): Patient will be able to perform dynamic standing balance with Moderate assist. PT Short Term Goal 4 (Week 2): Patient will be able to perform static standing balance with minimum assist. PT Short Term Goal 5 (Week 2): Patient will be able to perform gait using LRAD x 10' with Moderate assist x 2.  Skilled Therapeutic Interventions/Progress Updates:   Focus of session was transfers, trunk control and standing balance/NMR for improved weight bearing/muscle facilitation in RLE.  Performed squat/stand pivot transfer x 2 in gym and also in room with continued cues for scooting forward in chair, keeping weight forward and reaching for chair.  Performed at [facility] assist level (very close to min assist).  Performed sit <> stand x 4 reps with focus on upright posture, weight bearing through R LE, and weight shifts R and L.  Pt requires +2 assist to perform standing activity and requires increased rest breaks during activity.  Pt states that he feels "woozy" following standing activity.  BP WNL, but assisted pt back to room and notified nursing.  Assisted back to bed with wife present in room.  Call bell/phone in pts reach.    Therapy Documentation Precautions:  Precautions Precautions: Fall Precaution Comments: right hemiparesis  Restrictions Weight Bearing Restrictions: No   Vital Signs: Therapy Vitals BP: 122/81 mmHg Pain: Pain Assessment Pain Assessment: No/denies pain Mobility:   Locomotion : Wheelchair Mobility Distance: 85   See FIM for current functional  status  Therapy/Group: Individual Therapy  Vista Deck 03/08/2013, 3:01 PM

## 2013-03-08 NOTE — Progress Notes (Signed)
Speech Language Pathology Daily Session Note & Weekly Progress Note  Patient Details  Name: LAWERENCE DERY MRN: [id number] Date of Birth: July 18, 1941  Today's Date: 03/08/2013 Time: 0900-0940 Time Calculation (min): 40 min  Short Term Goals: Week 1: SLP Short Term Goal 1 (Week 1): The patient will perform oral motor exercises to increase speech intelligibility and decrease aspiration risk, with min A multimodal cueing.  SLP Short Term Goal 2 (Week 1): Patient will perform pharyngeal strengthening exercises to decrease aspiration risk, with min A multimodal cueing. SLP Short Term Goal 3 (Week 1): Patient will perform diaphragmatic exercises to increase vocal intensity, with min A multimodal cueing.  SLP Short Term Goal 4 (Week 1): Patient will demonstrate sustained attention to functional tasks, in noisy environment, with supervision verbal cues for redirection, in order to increase independence in his environment.  SLP Short Term Goal 4 - Progress (Week 1): Discontinued (comment) SLP Short Term Goal 5 (Week 1): Patient will utilize compensatory strategies to increase speech intelligibility with min A verbal cues.    Skilled Therapeutic Interventions: Skilled treatment session focused on addressing dysphagia goals.  SLP facilitated session with breakfast of Dys.1 textures and thin liquids via cup with Supervision level vebral cues to utilize small quick sips.  Patient demonstrated no overt s/s of aspiraiton throuhgout meal.  SLP also facilitated session with Min verbal cues to perform pharyngeal strengthening exercises accurately.  Patietnt with improved endurance as compared to last week; less SOB and fewer rest breaks required.     FIM:  Comprehension Comprehension Mode: Auditory Comprehension: 5-Follows basic conversation/direction: With no assist Expression Expression Mode: Verbal Expression: 3-Expresses basic 50 - 74% of the time/requires cueing 25 - 50% of the time. Needs to repeat  parts of sentences. Social Interaction Social Interaction: 4-Interacts appropriately 75 - 89% of the time - Needs redirection for appropriate language or to initiate interaction. Problem Solving Problem Solving: 5-Solves basic 90% of the time/requires cueing < 10% of the time Memory Memory: 4-Recognizes or recalls 75 - 89% of the time/requires cueing 10 - 24% of the time FIM - Eating Eating Activity: 5: Set-up assist for open containers;5: Needs verbal cues/supervision  Pain Pain Assessment Pain Assessment: No/denies pain  Therapy/Group: Individual Therapy   Speech Language Pathology Weekly Progress Note  Patient Details  Name: AZURE BARRALES MRN: [id number] Date of Birth: 1940-08-11  Today's Date: 03/08/2013  Short Term Goals: Week 1: SLP Short Term Goal 1 (Week 1): The patient will perform oral motor exercises to increase speech intelligibility and decrease aspiration risk, with min A multimodal cueing.  SLP Short Term Goal 1 - Progress (Week 1): Met SLP Short Term Goal 2 (Week 1): Patient will perform pharyngeal strengthening exercises to decrease aspiration risk, with min A multimodal cueing. SLP Short Term Goal 2 - Progress (Week 1): Met SLP Short Term Goal 3 (Week 1): Patient will perform diaphragmatic exercises to increase vocal intensity, with min A multimodal cueing.  SLP Short Term Goal 3 - Progress (Week 1): Progressing toward goal SLP Short Term Goal 4 (Week 1): Patient will demonstrate sustained attention to functional tasks, in noisy environment, with supervision verbal cues for redirection, in order to increase independence in his environment.  SLP Short Term Goal 4 - Progress (Week 1): Discontinued (comment) SLP Short Term Goal 5 (Week 1): Patient will utilize compensatory strategies to increase speech intelligibility with min A verbal cues.   SLP Short Term Goal 5 - Progress (Week 1): Progressing toward goal Week  2: SLP Short Term Goal 1 (Week 2): The patient will  perform oral motor and pharyngeal strengthening exercises with Supervision cueing.  SLP Short Term Goal 2 (Week 2): Patient will perform diaphragmatic exercises to increase vocal intensity, with Min A multimodal cueing.  SLP Short Term Goal 3 (Week 2): Patient will utilize compensatory strategies to increase speech intelligibility at the phrase level with Min verbal cues.   SLP Short Term Goal 4 (Week 2): Patient will consume Dys.2 textures and thin liquids with minimal s/s of aspiration.   Weekly Progress Updates: Patient met 2 out of 4 short term objectives this reporting period due to gains in ability to perform oral motor and pharyngeal strengthening exercises.  Patient is currently requiring Min cues.  Patient's diet was also advanced this week from Dys.1 textures to Dys.2 textures with continued need for nectar-thick liquids with meals.  However, goals for thin with meals is being address in the next reporting period as well as diaphragmatic breathing and increased use/carryover  of speech intelligibility strategies.  SLP Intensity: Minumum of 1-2 x/day, 30 to 90 minutes SLP Frequency: 5 out of 7 days SLP Duration/Estimated Length of Stay: 8/22 SLP Treatment/Interventions: Cueing hierarchy;Dysphagia/aspiration precaution training;Functional tasks;Internal/external aids;Oral motor exercises;Patient/family education;Therapeutic Activities  Charlane Ferretti., CCC-SLP [phone]  Rashonda Warrior 03/08/2013, 5:29 PM

## 2013-03-08 NOTE — Progress Notes (Signed)
Occupational Therapy Note  Patient Details  Name: Kevin Escobar MRN: [id number] Date of Birth: Jan 31, 1941 Today's Date: 03/08/2013  Time: 7829-5621 (cotx with Speech therapy-total time [phone]) Pt denies pain although patient c/o discomfort in RUE when placed on table; relieved with repositioning Group Therapy  Pt participated in self feeding group with focus on swallowing precautions, compensatory strategies, RUE use as gross stabilizer, and attention to task.  Pt required assistance with opening containers.  Pt adhered to swallowing precautions independently and asked for assistance appropriately.  Pt required tot A to use RUE as gross stabilizer.  Lavone Neri [facility] 03/08/2013, 3:13 PM

## 2013-03-08 NOTE — Progress Notes (Signed)
Occupational Therapy Session Note  Patient Details  Name: Kevin Escobar MRN: [id number] Date of Birth: 10/12/1940  Today's Date: 03/08/2013 Time: 1030-1120 Time Calculation (min): 50 min  Short Term Goals: Week 2:  OT Short Term Goal 1 (Week 2): Pt will transfer to toilet/ BSC with mod A consistently  OT Short Term Goal 2 (Week 2): Pt will don pants with max assist OT Short Term Goal 3 (Week 2): Pt will wash BLE including feet with steady assist OT Short Term Goal 4 (Week 2): Pt will don shirt using hemi-technique with min assist  Skilled Therapeutic Interventions/Progress Updates:    Pt seen for ADL retraining with focus on bed mobility, transfers, sit <> stand, and increased participation in self-care tasks of bathing and dressing. Squat pivot transfer to w/c to Lt with mod assist with pt demonstrating increased initiation and participation with use of arm rest. Pt requesting to drink water, educated on water protocol and need to complete oral hygiene prior to drinking thin water. Completed grooming in sitting at sink with setup assist to apply toothpaste to toothbrush and cues to not swallow thin liquids until post oral hygiene. Bathing completed at sit <> stand level at sink with increased participation, however pt unable to reach feet. Pt demonstrated increased initiation and participation with attempting to pull Lt pant leg over knee and assisting in pulling up over hips.  Pt required multiple rest breaks during session secondary to anxiety and heavy breathing.  Pt reports antidepressant is effecting his participation and making him drowsy.  Therapy Documentation Precautions:  Precautions Precautions: Fall Precaution Comments: right hemiparesis  Restrictions Weight Bearing Restrictions: No General: General Amount of Missed OT Time (min): 10 Minutes Pain: Pain Assessment Pain Assessment: No/denies pain Pain Score: 0-No pain  See FIM for current functional  status  Therapy/Group: Individual Therapy  Rosalio Loud 03/08/2013, 11:28 AM

## 2013-03-09 ENCOUNTER — Inpatient Hospital Stay (HOSPITAL_COMMUNITY): Payer: Medicare Other | Admitting: Speech Pathology

## 2013-03-09 ENCOUNTER — Inpatient Hospital Stay (HOSPITAL_COMMUNITY): Payer: Medicare Other | Admitting: Occupational Therapy

## 2013-03-09 ENCOUNTER — Inpatient Hospital Stay (HOSPITAL_COMMUNITY): Payer: Medicare Other | Admitting: Rehabilitation

## 2013-03-09 ENCOUNTER — Inpatient Hospital Stay (HOSPITAL_COMMUNITY): Payer: Medicare Other

## 2013-03-09 NOTE — Progress Notes (Signed)
Speech Language Pathology Daily Session Note  Patient Details  Name: Kevin Escobar MRN: [id number] Date of Birth: March 19, 1941  Today's Date: 03/09/2013 Time: 4696-2952 Time Calculation (min): 10 min  Short Term Goals: Week 2: SLP Short Term Goal 1 (Week 2): The patient will perform oral motor and pharyngeal strengthening exercises with Supervision cueing.  SLP Short Term Goal 2 (Week 2): Patient will perform diaphragmatic exercises to increase vocal intensity, with Min A multimodal cueing.  SLP Short Term Goal 3 (Week 2): Patient will utilize compensatory strategies to increase speech intelligibility at the phrase level with Min verbal cues.   SLP Short Term Goal 4 (Week 2): Patient will consume Dys.2 textures and thin liquids with minimal s/s of aspiration.   Skilled Therapeutic Interventions: Group, co-treatment with OT to address self-feeding; SLP facilitated session by addressing dysphagia goals. Patient consumed Dys.2 textures and upgrade of thin liquids with set-up assist and Min verbal cues to carryover swallowing compensatory strategies: multiple swallows. Patient demonstrated reflexive cough x3 during meal; however, SLP suspects cough was effective at reducing penetrates.  Continue with full staff supervision.    FIM:  Comprehension Comprehension Mode: Auditory Comprehension: 5-Follows basic conversation/direction: With no assist Expression Expression Mode: Verbal Expression: 3-Expresses basic 50 - 74% of the time/requires cueing 25 - 50% of the time. Needs to repeat parts of sentences. Social Interaction Social Interaction: 4-Interacts appropriately 75 - 89% of the time - Needs redirection for appropriate language or to initiate interaction. Problem Solving Problem Solving: 5-Solves basic 90% of the time/requires cueing < 10% of the time Memory Memory: 5-Recognizes or recalls 90% of the time/requires cueing < 10% of the time FIM - Eating Eating Activity: 5: Set-up assist for  open containers;5: Needs verbal cues/supervision  Pain Pain Assessment Pain Assessment: No/denies pain  Therapy/Group: Group Therapy  Charlane Ferretti., CCC-SLP [phone]  Kevin Escobar 03/09/2013, 3:30 PM

## 2013-03-09 NOTE — Progress Notes (Signed)
Physical Therapy Session Note  Patient Details  Name: Kevin Escobar MRN: [id number] Date of Birth: 14-Aug-1940  Today's Date: 03/09/2013 Time: 1335-1405 Time Calculation (min): 30 min  Short Term Goals: Week 2:  PT Short Term Goal 1 (Week 2): Pt will perform bed mobility with min assist and min cuing for technique.  PT Short Term Goal 2 (Week 2): Pt will perform transfers at moderate assist level.  PT Short Term Goal 3 (Week 2): Patient will be able to perform dynamic standing balance with Moderate assist. PT Short Term Goal 4 (Week 2): Patient will be able to perform static standing balance with minimum assist. PT Short Term Goal 5 (Week 2): Patient will be able to perform gait using LRAD x 10' with Moderate assist x 2.  Skilled Therapeutic Interventions/Progress Updates:    Pt seen for co treatment with PT for gait training and transfers. Pt performed gait at handrail in hall with +2 assist. Pt displays difficulty with R LE extension and displays moderate knee flexion, attempted to facilitate R knee extension but pt unable to extend, displays max hamstring tightness. Pt performed stand pivot transfer and sit to stand from bed to perform toileting with use of urinal with +2 assist. Pt and spouse education on stretching of R LE by placing rolled towel under R heel with knees of bed flat. Good understanding and pt left in this position to stretch R Hamstrings.   Therapy Documentation Precautions:  Precautions Precautions: Fall Precaution Comments: right hemiparesis  Restrictions Weight Bearing Restrictions: No    Pain: Pain Assessment Pain Assessment: No/denies pain    Locomotion : Wheelchair Mobility Distance: 46'             See FIM for current functional status  Therapy/Group: Co-Treatment with PT.  Shiela Mayer A 03/09/2013, 4:19 PM

## 2013-03-09 NOTE — Progress Notes (Signed)
Speech Language Pathology Daily Session Note  Patient Details  Name: Kevin Escobar MRN: [id number] Date of Birth: [birth year]  Today's Date: 03/09/2013 Time: 2952-8413 Time Calculation (min): 45 min  Short Term Goals: Week 2: SLP Short Term Goal 1 (Week 2): The patient will perform oral motor and pharyngeal strengthening exercises with Supervision cueing.  SLP Short Term Goal 2 (Week 2): Patient will perform diaphragmatic exercises to increase vocal intensity, with Min A multimodal cueing.  SLP Short Term Goal 3 (Week 2): Patient will utilize compensatory strategies to increase speech intelligibility at the phrase level with Min verbal cues.   SLP Short Term Goal 4 (Week 2): Patient will consume Dys.2 textures and thin liquids with minimal s/s of aspiration.   Skilled Therapeutic Interventions: Skilled treatment session focused on addressing speech intelligibility and dysphagia goals.  SLP facilitated session with Min faded to Supervision level verbal cues to perform chin tuck pharyngeal strengthening exercise with muscle activation from towel squeezed between chin and chest.  Patient completed 16/20 with intermittent rest breaks to recover from being SOB.  SLP also facilitated session with a noisy environment and Mod verbal cues to utilize increased vocal intensity at the phrase level and Max cues for sentence level verbal expression.     FIM:  Comprehension Comprehension Mode: Auditory Comprehension: 5-Follows basic conversation/direction: With no assist Expression Expression Mode: Verbal Expression: 3-Expresses basic 50 - 74% of the time/requires cueing 25 - 50% of the time. Needs to repeat parts of sentences. Social Interaction Social Interaction: 4-Interacts appropriately 75 - 89% of the time - Needs redirection for appropriate language or to initiate interaction. Problem Solving Problem Solving: 5-Solves basic 90% of the time/requires cueing < 10% of the time Memory Memory:  5-Recognizes or recalls 90% of the time/requires cueing < 10% of the time FIM - Eating Eating Activity: 5: Set-up assist for open containers;5: Needs verbal cues/supervision  Pain Pain Assessment Pain Assessment: No/denies pain  Therapy/Group: Individual Therapy  Charlane Ferretti., CCC-SLP [phone]  [name] 03/09/2013, 3:26 PM

## 2013-03-09 NOTE — Progress Notes (Signed)
Patient ID: Kevin Escobar, male   DOB: 31-Jan-1941, 72 y.o.   MRN: [id number] Subjective/Complaints:   Had a good night. Feels better this am with change in medication. ROS Negative except for weakness on the right side, remainder of systems normal  Objective: Vital Signs: Blood pressure 159/80, pulse 53, temperature 98.2 F (36.8 C), temperature source Oral, resp. rate 19, weight 59.24 kg (130 lb 9.6 oz), SpO2 96.00%. Dg Swallowing Func-speech Pathology    No results found for this or any previous visit (from the past 72 hour(s)).   HEENT: Eyes not injected, oral mucosa moist, external ears normal Cardio: RRR and No murmur Resp: CTA B/L and unlabored GI: BS positive and Nontender Extremity:  Pulses positive and No Edema Skin:   Intact Neuro: More Alert/Oriented, Flat, Cranial Nerve Abnormalities Right central 7, Normal Sensory, Abnormal Motor tr to 1/5 in the right upper except 1/5 R finger flexors. RLE tr in HF, KE. 0/5 distally. Tone:  Hypotonia and Dysarthria continues Musc/Skel:  Normal General no acute distress   Assessment/Plan: 1. 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 Physiatrist is providing close team supervision and 24 hour management of active medical problems listed below. Physiatrist and rehab team continue to assess barriers to discharge/monitor patient progress toward functional and medical goals. FIM: FIM - Bathing Bathing Steps Patient Completed: Chest;Right Arm;Abdomen;Front perineal area;Right upper leg;Left upper leg;Buttocks Bathing: 3: Mod-Patient completes 5-7 15f 10 parts or 50-74%  FIM - Upper Body Dressing/Undressing Upper body dressing/undressing steps patient completed: Thread/unthread left sleeve of pullover shirt/dress;Pull shirt over trunk;Put head through opening of pull over shirt/dress Upper body  dressing/undressing: 3: Mod-Patient completed 50-74% of tasks FIM - Lower Body Dressing/Undressing Lower body dressing/undressing steps patient completed: Thread/unthread left pants leg;Pull pants up/down Lower body dressing/undressing: 1: Total-Patient completed less than 25% of tasks  FIM - Toileting Toileting: 0: Activity did not occur  FIM - Archivist Transfers: 0-Activity did not occur  FIM - Banker Devices: Arm rests Bed/Chair Transfer: 3: Chair or W/C > Bed: Mod A (lift or lower assist)  FIM - Locomotion: Wheelchair Distance: 85 Locomotion: Wheelchair: 2: Travels 50 - 149 ft with supervision, cueing or coaxing FIM - Locomotion: Ambulation Locomotion: Ambulation Assistive Devices:  (L handrail) Ambulation/Gait Assistance: Not tested (comment) Locomotion: Ambulation: 0: Activity did not occur  Comprehension Comprehension Mode: Auditory Comprehension: 5-Follows basic conversation/direction: With no assist  Expression Expression Mode: Verbal Expression: 3-Expresses basic 50 - 74% of the time/requires cueing 25 - 50% of the time. Needs to repeat parts of sentences.  Social Interaction Social Interaction Mode: Asleep Social Interaction: 4-Interacts appropriately 75 - 89% of the time - Needs redirection for appropriate language or to initiate interaction.  Problem Solving Problem Solving Mode: Asleep Problem Solving: 5-Solves basic 90% of the time/requires cueing < 10% of the time  Memory Memory Mode: Asleep Memory: 4-Recognizes or recalls 75 - 89% of the time/requires cueing 10 - 24% of the time   Medical Problem List and Plan:  1. DVT Prophylaxis/Anticoagulation: Pharmaceutical: Lovenox  2. Pain Management: will add scheduled hs tramadol for back pain. May help with sleep as well. 3. Mood: Provide anxiety/mood support. Has a supportive family. On nicotine patch  -changed to lexapro 5mg  and give earlier in evening  to reduce am sedation 4. Neuropsych: This patient is capable of making decisions on his own behalf.  5. HTN: will monitor with bid checks. Continue metoprolol, norvasc and catapres. Off lasix due to renal insufficiency/dysphagia diet. Mainly systolic elevation. Clonidine recently adjusted. No changes at  present 6. Reactive leucocytosis: Resolving.  7. Dyslipidemia: Continue lipitor.  8. Dysphagia: continue D1, nectar liquids. Continue to educate patient and family on compliance of current restrictions and aspiration risk. Added nocturnal fluid for hydration as appears dry.  9. Prostate cancer/bladder CA: H/o frequency. Toilet every 2-3 hours to help with continence. Condom cath at night.  10. CKD: Will monitor with routine checks. Currently at baseline.   -dc'ed weekly CR  LOS (Days) 11 A FACE TO FACE EVALUATION WAS PERFORMED  SWARTZ,[name] 03/09/2013, 8:51 AM

## 2013-03-09 NOTE — Progress Notes (Signed)
Physical Therapy Session Note  Patient Details  Name: Kevin Escobar MRN: [id number] Date of Birth: 20-May-1941  Today's Date: 03/09/2013 Time: 7829-5621 Time Calculation (min): 30 min  Short Term Goals: Week 2:  PT Short Term Goal 1 (Week 2): Pt will perform bed mobility with min assist and min cuing for technique.  PT Short Term Goal 2 (Week 2): Pt will perform transfers at moderate assist level.  PT Short Term Goal 3 (Week 2): Patient will be able to perform dynamic standing balance with Moderate assist. PT Short Term Goal 4 (Week 2): Patient will be able to perform static standing balance with minimum assist. PT Short Term Goal 5 (Week 2): Patient will be able to perform gait using LRAD x 10' with Moderate assist x 2.  Skilled Therapeutic Interventions/Progress Updates:   Skilled Co-treat with PTA for this treatment session.  Focus of first half of session was NMR in quadruped position for increased WB through RUE/LE with assist RUE to ensure proper positioning of humerus in glenoid fossa.  Pt unable to fully weight bear with extended elbow, therefore propped elbow on therapists lap on R side.  Had pt perform forward/backward and side/side weight shifts with manual facilitation at hips and at shoulder.  Requires +2 assist to attain position and to get back into supine position.    Therapy Documentation Precautions:  Precautions Precautions: Fall Precaution Comments: right hemiparesis  Restrictions Weight Bearing Restrictions: No   Vital Signs: Therapy Vitals BP: 150/74 mmHg Pain: Pain Assessment Pain Assessment: No/denies pain   Locomotion : Wheelchair Mobility Distance: 39'   See FIM for current functional status  Therapy/Group: Individual Therapy  Vista Deck 03/09/2013, 4:12 PM

## 2013-03-09 NOTE — Progress Notes (Signed)
Occupational Therapy Session Note  Patient Details  Name: Kevin Escobar MRN: [id number] Date of Birth: May 16, 1941  Today's Date: 03/09/2013 Time: 7829-5621 Time Calculation (min): 55 min  Short Term Goals: Week 2:  OT Short Term Goal 1 (Week 2): Pt will transfer to toilet/ BSC with mod A consistently  OT Short Term Goal 2 (Week 2): Pt will don pants with max assist OT Short Term Goal 3 (Week 2): Pt will wash BLE including feet with steady assist OT Short Term Goal 4 (Week 2): Pt will don shirt using hemi-technique with min assist  Skilled Therapeutic Interventions/Progress Updates:    Pt seen for ADL retraining with focus on increased participation and initiation with mobility and bathing and dressing tasks.  Pt in bed upon arrival, willing to get OOB to participate in treatment session.  Discussed bathing at shower level, to which pt requested to wash at sink today but was willing to attempt shower during next session.  Discussed use of tub bench and grab bars for safety and performing at sit <> stand, similar to bathing at sink.  Mod assist squat pivot transfer this session, requiring lifting assistance but pt with increased initiation.  Bathing completed at sit to stand level with tactile cues at Lt hip and Rt scapula to increase upright standing and appropriate weight shifting as pt tends to stand with Rt trunk flexed and rotated backwards.  Pt continues to require physical assistance with washing BLE (including feet) and donning socks and shoes.  Pt completed grooming with setup assist to apply toothpaste.  Therapy Documentation Precautions:  Precautions Precautions: Fall Precaution Comments: right hemiparesis  Restrictions Weight Bearing Restrictions: No General:   Vital Signs: Therapy Vitals Pulse Rate: 68 BP: 145/72 mmHg Pain:  pt with no c/o pain this session  See FIM for current functional status  Therapy/Group: Individual Therapy  Rosalio Loud 03/09/2013, 10:59 AM

## 2013-03-09 NOTE — Progress Notes (Signed)
Physical Therapy Note  Patient Details  Name: Kevin Escobar MRN: [id number] Date of Birth: 04-14-1941 Today's Date: 03/09/2013  Time: 1130-1145 Group Therapy  No c/o pain.   Pt participated in self-feeding group with focus on swallowing precautions, RUE use as gross stabilizer, attention to task, and compensatory strategies. Pt requires assist to open container. Pt adhered to swallowing precautions.   Sharol Croghan N 03/09/2013, 1:30 PM

## 2013-03-10 ENCOUNTER — Inpatient Hospital Stay (HOSPITAL_COMMUNITY): Payer: Medicare Other | Admitting: Occupational Therapy

## 2013-03-10 ENCOUNTER — Inpatient Hospital Stay (HOSPITAL_COMMUNITY): Payer: Medicare Other | Admitting: Physical Therapy

## 2013-03-10 ENCOUNTER — Inpatient Hospital Stay (HOSPITAL_COMMUNITY): Payer: Medicare Other

## 2013-03-10 DIAGNOSIS — I69991 Dysphagia following unspecified cerebrovascular disease: Secondary | ICD-10-CM

## 2013-03-10 DIAGNOSIS — I6330 Cerebral infarction due to thrombosis of unspecified cerebral artery: Secondary | ICD-10-CM

## 2013-03-10 NOTE — Patient Care Conference (Addendum)
Inpatient RehabilitationTeam Conference and Plan of Care Update Date: 03/10/2013   Time: 10:15 AM    Patient Name: Kevin Escobar      Medical Record Number: [id number]  Date of Birth: 11/11/1940 Sex: Male         Room/Bed: 4W07C/4W07C-01 Payor Info: Payor: MEDICARE / Plan: MEDICARE PART A AND B / Product Type: *No Product type* /    Admitting Diagnosis: L CVA  Admit Date/Time:  02/26/2013  4:57 PM Admission Comments: No comment available   Primary Diagnosis:  CVA (cerebral vascular accident) Principal Problem: CVA (cerebral vascular accident)  Patient Active Problem List   Diagnosis Date Noted  . Other emphysema 03/03/2013  . Hypokalemia 02/22/2013  . Depression 02/21/2013  . PVD (peripheral vascular disease) 02/21/2013  . CVA (cerebral vascular accident) 02/20/2013  . Chronic kidney disease   . Hypertension   . TIA (transient ischemic attack)   . Peripheral vascular disease     Expected Discharge Date: Expected Discharge Date: 03/26/13  Team Members Present: Physician leading conference: Dr. Faith Rogue Social Worker Present: Staci Acosta, LCSW Nurse Present: Ethelene Browns, RN PT Present: Cyndia Skeeters, PT;Emily Parcell, Varney Biles, PT OT Present: Scherrie November, OT;Sarah Hoxie, Felipa Eth, OT SLP Present: Maxcine Ham, Paticia Stack, SLP     Current Status/Progress Goal Weekly Team Focus  Medical   slow motor recovery, depression being treated. pain better  see prior, improve mood/sleep wake  see prior, depression rx.   Bowel/Bladder   Continent of bladder with timed toileting. wears condon cath @hs . Continent of bowel. LBM 03/07/13   Continent of bowel and bladder  Continue with timed toileting   Swallow/Nutrition/ Hydration   Dys.2 textures and thin liquids with full supervision   least restrictive p.o. intake  increase carryover of compensatory strategies and trials of Dys.3 textures    ADL's   mod assist bathing, mod assist UB  dressing, total assist LB dressing, mod assist transfers, min-mod assist sit <> stand  min assist overall  transfers, sit <> stand, standing tolerance, dynamic sitting balance, RUE NM re-ed   Mobility   Mod assist for bed mobility, mod to max for transfers (more mod when moving to R), +2 for standing and ambulation at this time.   min assist to supervision overall  balance, safety, w/c mobility, gait, NMR for increased RLE and RUE, transfers   Communication   Mod assist   Min assist  increase breath support and carryover of strengthening exercises    Safety/Cognition/ Behavioral Observations  WFL with basic          Pain   No c/o pain  <3  Offer pain medication 1 hr prior to initial therapy session   Skin   skin cdi  No skin breakdown  Routine turn q 2hrs. Monitor skin q shift       *See Care Plan and progress notes for long and short-term goals.  Barriers to Discharge: ongoing neurological deficits, mood    Possible Resolutions to Barriers:  antidepressants, ongoing therapy, education    Discharge Planning/Teaching Needs:   Plan for pt continues to be home with his wife and dtr at d/c.     Team Discussion:  MD continues to address pt's depression, pain, and lack of sleep.  Pt is doing better with medication adjustments.  Wrist and ankle splints are being added to support pt's right side affected by CVA.  Right hamstring tightness. Pt is more cheerful and has participated more.  Pt still  continues to need mod assist for bed to chair transfers- max assist +2 for standing/walking.  His progress has not changed much, but his participation is better, so hopefully results with be forthcoming.  ST has changed diet to D2 textures and thin liquids and pt is off IV fluids. SW continues to follow to assess wife's ability to care for pt.  Revisions to Treatment Plan:  None   Continued Need for Acute Rehabilitation Level of Care: The patient requires daily medical management by a physician with  specialized training in physical medicine and rehabilitation for the following conditions: Daily direction of a multidisciplinary physical rehabilitation program to ensure safe treatment while eliciting the highest outcome that is of practical value to the patient.: Yes Daily medical management of patient stability for increased activity during participation in an intensive rehabilitation regime.: Yes Daily analysis of laboratory values and/or radiology reports with any subsequent need for medication adjustment of medical intervention for : Neurological problems (depression\)  Jacqualine Weichel, [place] Deck 03/10/2013, 12:00 PM

## 2013-03-10 NOTE — Progress Notes (Signed)
Physical Therapy Session Note  Patient Details  Name: Kevin Escobar MRN: [id number] Date of Birth: 1940-09-24  Today's Date: 03/10/2013 Time: 1305-1400 Time Calculation (min): 55 min  Short Term Goals: Week 2:  PT Short Term Goal 1 (Week 2): Pt will perform bed mobility with min assist and min cuing for technique.  PT Short Term Goal 2 (Week 2): Pt will perform transfers at moderate assist level.  PT Short Term Goal 3 (Week 2): Patient will be able to perform dynamic standing balance with Moderate assist. PT Short Term Goal 4 (Week 2): Patient will be able to perform static standing balance with minimum assist. PT Short Term Goal 5 (Week 2): Patient will be able to perform gait using LRAD x 10' with Moderate assist x 2.  Skilled Therapeutic Interventions/Progress Updates:   Pt resting in w/c; no c/o pain.  Pt performed w/c mobility in controlled environment x 150' with L hemi technique and min-mod A to maintain straight navigation.  Pt able to position w/c appropriately for transfer to mat and able to manage both brakes, leg rest and arm rest with min-mod verbal and visual cues.  Pt performed squat pivot transfer w/c > mat to L side with mod A to maintain hip elevation during full pivot.  Performed sit > supine on flat mat with min-mod A to bring RLE up onto mat.  On mat performed PROM/stretching to R hamstring muscle and AAROM with gravity minimized to RLE for hip IR, hip ABD/ADD, hip and knee flexion <> extension and extension with bilat LE bridging x 8 reps total with verbal cues for increased RLE hip extension.  Performed supine > sit from flat mat with mod A for rolling to L side and side > sit with tactile cues at pelvis for depression and trunk lateral flexion on R side.  Performed sit <> stand x 3 reps from elevated mat and performed prolonged static standing with UE support on unstable rolling table for increased use of trunk and LE for stability during lateral weight shifting and weight  acceptance on RLE with focus on trunk elongation and activation of RLE hip and knee extensors in stance while attempting to advance LLE.  Required frequent rest breaks secondary to fatigue.  Transfer back to w/c to R side with mod-max A squat pivot over the back with focus on maintaining anterior weight shift over LE during full pivot.  Pt very fatigued at end of session and required max-total A on R side to maintain standing.       Therapy Documentation Precautions:  Precautions Precautions: Fall Precaution Comments: right hemiparesis  Restrictions Weight Bearing Restrictions: No  See FIM for current functional status  Therapy/Group: Individual Therapy  Edman Circle [facility] 03/10/2013, 2:14 PM

## 2013-03-10 NOTE — Progress Notes (Signed)
Orthopedic Tech Progress Note Patient Details:  Kevin Escobar 02-19-1941 [id number]  Patient ID: [name], male   DOB: February 05, 1941, 72 y.o.   MRN: [id number]   [name] 03/10/2013, 5:36 PMRight prafo completed by advanced.

## 2013-03-10 NOTE — Progress Notes (Signed)
Speech Language Pathology Daily Session Note  Patient Details  Name: Kevin Escobar MRN: [id number] Date of Birth: 12-Sep-1940  Today's Date: 03/10/2013 Time: 1500-1530 Time Calculation (min): 30 min  Short Term Goals: Week 2: SLP Short Term Goal 1 (Week 2): The patient will perform oral motor and pharyngeal strengthening exercises with Supervision cueing.  SLP Short Term Goal 2 (Week 2): Patient will perform diaphragmatic exercises to increase vocal intensity, with Min A multimodal cueing.  SLP Short Term Goal 3 (Week 2): Patient will utilize compensatory strategies to increase speech intelligibility at the phrase level with Min verbal cues.   SLP Short Term Goal 4 (Week 2): Patient will consume Dys.2 textures and thin liquids with minimal s/s of aspiration.   Skilled Therapeutic Interventions: Skilled treatment focused on swallowing goals. SLP facilitated session with Mod verbal/visual cues for increased vocal intensity in conversation. Pt performed 20 tongue base retractions with Min verbal/visual cues, as well as 20 towel tuck swallows with supervision level verbal cues. Pt was also observed with cup sips of thin liquid with no overt s/s of aspiration noted. Continue plan of care.   FIM:  Comprehension Comprehension Mode: Auditory Comprehension: 5-Follows basic conversation/direction: With extra time/assistive device Expression Expression Mode: Verbal Expression: 3-Expresses basic 50 - 74% of the time/requires cueing 25 - 50% of the time. Needs to repeat parts of sentences. Social Interaction Social Interaction: 5-Interacts appropriately 90% of the time - Needs monitoring or encouragement for participation or interaction. Problem Solving Problem Solving: 5-Solves basic 90% of the time/requires cueing < 10% of the time Memory Memory: 4-Recognizes or recalls 75 - 89% of the time/requires cueing 10 - 24% of the time FIM - Eating Eating Activity: 5: Supervision/cues  Pain Pain  Assessment Pain Assessment: No/denies pain  Therapy/Group: Individual Therapy  Maxcine Ham 03/10/2013, 3:47 PM  Maxcine Ham, M.A. CCC-SLP

## 2013-03-10 NOTE — Progress Notes (Signed)
Occupational Therapy Session Note  Patient Details  Name: Kevin Escobar MRN: [id number] Date of Birth: [birth year]  Today's Date: 03/10/2013 Time: 0902-0958 Time Calculation (min): 56 min  Short Term Goals: Week 2:  OT Short Term Goal 1 (Week 2): Pt will transfer to toilet/ BSC with mod A consistently  OT Short Term Goal 2 (Week 2): Pt will don pants with max assist OT Short Term Goal 3 (Week 2): Pt will wash BLE including feet with steady assist OT Short Term Goal 4 (Week 2): Pt will don shirt using hemi-technique with min assist  Skilled Therapeutic Interventions/Progress Updates:    Pt seen for ADL retraining with focus on transfers and sit <> stand during self-care tasks.  Pt in bed upon arrival and recalled plan to bathe at shower level this session.  Pt performed squat pivot transfer bed > w/c with mod assist with increased participation and carryover of technique.  Max assist required for transfer to tub bench in walk-in shower due to unfamiliar task.  Pt with improved sustained attention and sequencing with bathing at shower level, requiring fewer cues for initiation.  Pt with increased initiation with LB dressing, with pulling up Lt pant leg and pulling pants over hips without assistance.  Pt however continues to require mod assist to maintain standing balance while pulling pants over hips secondary to weight shift and inability to maintain knee extension.  Pt appeared to be in higher spirits this session and more involved in each task.  At end of session, pt requested to return to bed due to "worn out" from shower.  Therapy Documentation Precautions:  Precautions Precautions: Fall Precaution Comments: right hemiparesis  Restrictions Weight Bearing Restrictions: No Pain:   Pt with no c/o pain this session.  See FIM for current functional status  Therapy/Group: Individual Therapy  Rosalio Loud 03/10/2013, 10:41 AM

## 2013-03-10 NOTE — Progress Notes (Signed)
Orthopedic Tech Progress Note Patient Details:  Kevin Escobar 04-30-1941 [id number]  Patient ID: Kevin Escobar, male   DOB: 1940-10-27, 72 y.o.   MRN: [id number]   Kevin Escobar 03/10/2013, 9:08 AMCalled advanced for right resting wrist ,hand brace.

## 2013-03-10 NOTE — Progress Notes (Signed)
Occupational Therapy Note  Patient Details  Name: Kevin Escobar MRN: [id number] Date of Birth: 1941/01/30 Today's Date: 03/10/2013  Time In:  1130 Time Out:  1200.  Individual session no complaints of pain.  Treatment focused on self feeding.  Patient demonstrates safe swallowing techniques and able to monitor small bites/sips without verbal cues.  Patient continues to need min verbal cues for hygiene when eating.  Attempted to incorporate RUE into meal however patient very reluctant and wished to keep arm in lap.  Patient stated he had no pain.  Did allow this therapist to position RUE for approximately 5 minutes on the table but then requested to place it in his lap. Will continue to try and encourage incorporation of RUE into self feeding.    [name] 03/10/2013, 12:38 PM

## 2013-03-10 NOTE — Progress Notes (Addendum)
Patient ID: Kevin Escobar, male   DOB: [birth year], 72 y.o.   MRN: [id number] Subjective/Complaints:   Slept well again. Asked why his right foot is pointing down and is curled inward ROS  A 12 point review of systems has been performed and if not noted above is otherwise negative.   Objective: Vital Signs: Blood pressure 139/65, pulse 60, temperature 98.1 F (36.7 C), temperature source Oral, resp. rate 18, weight 59.24 kg (130 lb 9.6 oz), SpO2 94.00%. Dg Swallowing Func-speech Pathology    No results found for this or any previous visit (from the past 72 hour(s)).   HEENT: Eyes not injected, oral mucosa moist, external ears normal Cardio: RRR and No murmur Resp: CTA B/L and unlabored GI: BS positive and Nontender Extremity:  Pulses positive and No Edema Skin:   Intact, right heel boggy Neuro: More Alert/Oriented, Flat, Cranial Nerve Abnormalities Right central 7, Normal Sensory, Abnormal Motor tr to 1/5 in the right upper except 1/5 R finger flexors.(trace tone at wrist,fingers) RLE tr in HF, KE. 0/5 distally. Equinovarus position of right foot. Tone:    Dysarthria continues Musc/Skel:  Normal General no acute distress   Assessment/Plan: 1. 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 Physiatrist is providing close team supervision and 24 hour management of active medical problems listed below. Physiatrist and rehab team continue to assess barriers to discharge/monitor patient progress toward functional and medical goals.  Will order resting right WHO and PRAFO to support right foot/heel and to stretch fingers/wrist at night FIM: FIM - Bathing Bathing Steps Patient Completed: Chest;Right Arm;Abdomen;Front perineal area;Right upper leg;Left upper leg;Buttocks Bathing: 3: Mod-Patient completes 5-7 85f 10 parts or 50-74%  FIM - Upper Body  Dressing/Undressing Upper body dressing/undressing steps patient completed: Thread/unthread left sleeve of pullover shirt/dress;Pull shirt over trunk;Put head through opening of pull over shirt/dress Upper body dressing/undressing: 3: Mod-Patient completed 50-74% of tasks FIM - Lower Body Dressing/Undressing Lower body dressing/undressing steps patient completed: Thread/unthread left pants leg;Pull pants up/down Lower body dressing/undressing: 1: Total-Patient completed less than 25% of tasks  FIM - Toileting Toileting: 0: Activity did not occur  FIM - Archivist Transfers: 0-Activity did not occur  FIM - Banker Devices: Arm rests Bed/Chair Transfer: 4: Sit > Supine: Min A (steadying pt. > 75%/lift 1 leg);3: Bed > Chair or W/C: Mod A (lift or lower assist);3: Chair or W/C > Bed: Mod A (lift or lower assist)  FIM - Locomotion: Wheelchair Distance: 65' Locomotion: Wheelchair: 2: Travels 50 - 149 ft with supervision, cueing or coaxing FIM - Locomotion: Ambulation Locomotion: Ambulation Assistive Devices:  (L handrail) Ambulation/Gait Assistance: Not tested (comment) Locomotion: Ambulation: 0: Activity did not occur  Comprehension Comprehension Mode: Auditory Comprehension: 5-Follows basic conversation/direction: With no assist  Expression Expression Mode: Verbal Expression: 3-Expresses basic 50 - 74% of the time/requires cueing 25 - 50% of the time. Needs to repeat parts of sentences.  Social Interaction Social Interaction Mode: Asleep Social Interaction: 4-Interacts appropriately 75 - 89% of the time - Needs redirection for appropriate language or to initiate interaction.  Problem Solving Problem Solving Mode: Asleep Problem Solving: 5-Solves basic 90% of the time/requires cueing < 10% of the time  Memory Memory Mode: Asleep Memory: 5-Recognizes or recalls 90% of the time/requires cueing < 10% of the time   Medical  Problem List and Plan:  1. DVT Prophylaxis/Anticoagulation: Pharmaceutical:  Lovenox  2. Pain Management: will add scheduled hs tramadol for back pain. May help with sleep as well. 3. Mood: Provide anxiety/mood support. Has a supportive family. On nicotine patch  -changed to lexapro 5mg  and give earlier in evening to reduce am sedation 4. Neuropsych: This patient is capable of making decisions on his own behalf.  5. HTN: will monitor with bid checks. Continue metoprolol, norvasc and catapres. Off lasix due to renal insufficiency/dysphagia diet. Mainly systolic elevation. Clonidine recently adjusted. No changes at  present 6. Reactive leucocytosis: Resolving.  7. Dyslipidemia: Continue lipitor.  8. Dysphagia: continue D2, nectar liquids. Continue to educate patient and family on compliance of current restrictions and aspiration risk.  Recheck bmet tomorrow 9. Prostate cancer/bladder CA: H/o frequency. Toilet every 2-3 hours to help with continence. Condom cath at night.  10. CKD: Will monitor with routine checks. Currently at baseline.   -dc'ed weekly CR  LOS (Days) 12 A FACE TO FACE EVALUATION WAS PERFORMED  Kevin Escobar T 03/10/2013, 9:01 AM

## 2013-03-10 NOTE — Progress Notes (Signed)
Social Work Patient ID: Kevin Escobar, male   DOB: 1940/10/08, 72 y.o.   MRN: [id number]  Kevin Deck [name], LCSW Social Worker Signed  Patient Care Conference Service date: 03/10/2013 12:00 PM  Inpatient RehabilitationTeam Conference and Plan of Care Update Date: 03/10/2013   Time: 10:15 AM     Patient Name: Kevin Escobar       Medical Record Number: [id number]   Date of Birth: [birth year] Sex: Male         Room/Bed: 4W07C/4W07C-01 Payor Info: Payor: MEDICARE / Plan: MEDICARE PART A AND B / Product Type: *No Product type* /   Admitting Diagnosis: L CVA   Admit Date/Time:  02/26/2013  4:57 PM Admission Comments: No comment available   Primary Diagnosis:  CVA (cerebral vascular accident) Principal Problem: CVA (cerebral vascular accident)    Patient Active Problem List     Diagnosis  Date Noted   .  Other emphysema  03/03/2013   .  Hypokalemia  02/22/2013   .  Depression  02/21/2013   .  PVD (peripheral vascular disease)  02/21/2013   .  CVA (cerebral vascular accident)  02/20/2013   .  Chronic kidney disease     .  Hypertension     .  TIA (transient ischemic attack)     .  Peripheral vascular disease       Expected Discharge Date: Expected Discharge Date: 03/26/13  Team Members Present: Physician leading conference: Dr. Faith Rogue Social Worker Present: Kevin Acosta, LCSW Nurse Present: Kevin Browns, RN PT Present: Kevin Escobar, PT;Kevin Escobar, Kevin Escobar, PT OT Present: Kevin Escobar, OT;Kevin Escobar, Kevin Escobar, OT SLP Present: Kevin Escobar, Kevin Escobar, SLP        Current Status/Progress  Goal  Weekly Team Focus   Medical     slow motor recovery, depression being treated. pain better  see prior, improve mood/sleep wake  see prior, depression rx.   Bowel/Bladder     Continent of bladder with timed toileting. wears condon cath @[facility] . Continent of bowel. LBM 03/07/13   Continent of bowel and bladder  Continue with timed toileting    Swallow/Nutrition/ Hydration     Dys.2 textures and thin liquids with full supervision   least restrictive p.o. intake  increase carryover of compensatory strategies and trials of Dys.3 textures    ADL's     mod assist bathing, mod assist UB dressing, total assist LB dressing, mod assist transfers, min-mod assist sit <> stand  min assist overall  transfers, sit <> stand, standing tolerance, dynamic sitting balance, RUE NM re-ed   Mobility     Mod assist for bed mobility, mod to max for transfers (more mod when moving to R), +2 for standing and ambulation at this time.   min assist to supervision overall  balance, safety, w/c mobility, gait, NMR for increased RLE and RUE, transfers   Communication     Mod assist   Min assist  increase breath support and carryover of strengthening exercises    Safety/Cognition/ Behavioral Observations    WFL with basic        Pain     No c/o pain  <3  Offer pain medication 1 hr prior to initial therapy session   Skin     skin cdi  No skin breakdown  Routine turn q 2hrs. Monitor skin q shift      *See Care Plan and progress notes for long and short-term goals.    Barriers to Discharge:  ongoing neurological deficits, mood      Possible Resolutions to Barriers:    antidepressants, ongoing therapy, education      Discharge Planning/Teaching Needs:         Team Discussion:    MD continues to address pt's depression, pain, and lack of sleep.  Pt is doing better with medication adjustments.  Wrist and ankle splints are being added to support pt's right side affected by CVA.  Right hamstring tightness. Pt is more cheerful and has participated more.  Pt still continues to need mod assist for bed to chair transfers- max assist +2 for standing/walking.  His progress has not changed much, but his participation is better, so hopefully results with be forthcoming.  ST has changed diet to D2 textures and thin liquids and pt is off IV fluids. SW continues to  follow to assess wife's ability to care for pt.   Revisions to Treatment Plan:    None    Continued Need for Acute Rehabilitation Level of Care: The patient requires daily medical management by a physician with specialized training in physical medicine and rehabilitation for the following conditions: Daily direction of a multidisciplinary physical rehabilitation program to ensure safe treatment while eliciting the highest outcome that is of practical value to the patient.: Yes Daily medical management of patient stability for increased activity during participation in an intensive rehabilitation regime.: Yes Daily analysis of laboratory values and/or radiology reports with any subsequent need for medication adjustment of medical intervention for : Neurological problems (depression\)  Kevin Escobar, Kevin Deck 03/10/2013, 12:00 PM

## 2013-03-11 ENCOUNTER — Inpatient Hospital Stay (HOSPITAL_COMMUNITY): Payer: Medicare Other | Admitting: Physical Therapy

## 2013-03-11 ENCOUNTER — Inpatient Hospital Stay (HOSPITAL_COMMUNITY): Payer: Medicare Other | Admitting: Occupational Therapy

## 2013-03-11 ENCOUNTER — Inpatient Hospital Stay (HOSPITAL_COMMUNITY): Payer: Medicare Other | Admitting: Speech Pathology

## 2013-03-11 ENCOUNTER — Inpatient Hospital Stay (HOSPITAL_COMMUNITY): Payer: Medicare Other | Admitting: *Deleted

## 2013-03-11 DIAGNOSIS — I6330 Cerebral infarction due to thrombosis of unspecified cerebral artery: Secondary | ICD-10-CM

## 2013-03-11 DIAGNOSIS — I69991 Dysphagia following unspecified cerebrovascular disease: Secondary | ICD-10-CM

## 2013-03-11 LAB — BASIC METABOLIC PANEL
BUN: 35 mg/dL — ABNORMAL HIGH (ref 6–23)
Calcium: 9.4 mg/dL (ref 8.4–10.5)
Chloride: 105 mEq/L (ref 96–112)
Creatinine, Ser: 2.64 mg/dL — ABNORMAL HIGH (ref 0.50–1.35)
GFR calc Af Amer: 26 mL/min — ABNORMAL LOW (ref >90)
GFR calc non Af Amer: 22 mL/min — ABNORMAL LOW (ref >90)

## 2013-03-11 NOTE — Progress Notes (Signed)
Physical Therapy Session Note  Patient Details  Name: Kevin Escobar MRN: [id number] Date of Birth: 11/19/1940  Today's Date: 03/11/2013 Time: 7846-9629 Time Calculation (min): 54 min  Short Term Goals: Week 1:  PT Short Term Goal 1 (Week 1): Patient will be able to perform bed mobility with Moderate assist. PT Short Term Goal 1 - Progress (Week 1): Met PT Short Term Goal 2 (Week 1): Patient will be able to perform transfers with Moderate assist. PT Short Term Goal 2 - Progress (Week 1): Progressing toward goal PT Short Term Goal 3 (Week 1): Patient will be able to perform gait using LRAD x 10' with Moderate assist x 2. PT Short Term Goal 3 - Progress (Week 1): Progressing toward goal PT Short Term Goal 4 (Week 1): Patient will be able to perform static standing balance with minimum assist. PT Short Term Goal 4 - Progress (Week 1): Progressing toward goal PT Short Term Goal 5 (Week 1): Patient will be able to perform dynamic standing balance with Moderate assist. PT Short Term Goal 5 - Progress (Week 1): Progressing toward goal Week 2:  PT Short Term Goal 1 (Week 2): Pt will perform bed mobility with min assist and min cuing for technique.  PT Short Term Goal 2 (Week 2): Pt will perform transfers at moderate assist level.  PT Short Term Goal 3 (Week 2): Patient will be able to perform dynamic standing balance with Moderate assist. PT Short Term Goal 4 (Week 2): Patient will be able to perform static standing balance with minimum assist. PT Short Term Goal 5 (Week 2): Patient will be able to perform gait using LRAD x 10' with Moderate assist x 2.  Skilled Therapeutic Interventions/Progress Updates:   Pt reporting significant fatigue this pm but willing to participate.  Wife present for session; discussed with wife home entry and house set up.  Wife reports 2 steps to landing + 8 inch into house with one rail + 3 steps in house to reach hallway to bedroom with one rail.  Wife reports she has  provided OT with doorway measurements but will measure bed height.  Provided wife with education on w/c placement beside bed and demonstrated to wife how to perform stand pivot transfers w/c <> tall bed to L and R with patient UE support on bed and assisting advancement of RLE.  Once sitting on bed pt required block under feet to allow pt to push back for better positioning of buttocks on bed to prepare for sit > supine.  Wife reports they have a block in the kitchen they can use under his feet or they can remove box spring to lower bed.  Pt able to perform sit > supine by hooking LLE under RLE and transition to sidelying and side <> supine with min A and min verbal cues for attention to positioning of RUE and RLE for rolling.  Pt able to perform supine > sit with min A to initiate bringing trunk upright from L sidelying.  Returned to w/c with min-mod A to R side.  Performed NMR with GiveMohr sling during gait in hallway; see below for details.    Therapy Documentation Precautions:  Precautions Precautions: Fall Precaution Comments: right hemiparesis  Restrictions Weight Bearing Restrictions: No Vital Signs: Therapy Vitals BP: 132/76 mmHg Pain: Pain Assessment Pain Assessment: No/denies pain Locomotion : Ambulation Ambulation/Gait Assistance: 2: Max assist  Other Treatments: Treatments Neuromuscular Facilitation: Right;Upper Extremity;Lower Extremity;Activity to increase motor control;Activity to increase timing and sequencing;Activity  to increase sustained activation;Activity to increase lateral weight shifting;Activity to increase anterior-posterior weight shifting with GiveMohr sling donned to RUE to assist with reduction of subluxation and improve positioning and support of RUE for improved trunk posture during gait x 25' in hallway with LUE support on wall rail and mod-max A to fully clear RLE (pt able to initiate hip flexion but no DF noted), safe placement of RLE and cues for anterior  weight shift and acceptance on RLE for full step length LLE.  Pt and wife very encouraged after gait training and pt reports that his RUE didn't feel as heavy with sling.    See FIM for current functional status  Therapy/Group: Individual Therapy  Edman Circle [facility] 03/11/2013, 3:37 PM

## 2013-03-11 NOTE — Plan of Care (Signed)
Problem: RH SKIN INTEGRITY Goal: RH STG SKIN FREE OF INFECTION/BREAKDOWN Skin free of infection/breakdown at d/c  Outcome: Progressing No s/s of infection of skin breakdown

## 2013-03-11 NOTE — Progress Notes (Signed)
Social Work Patient ID: Kevin Escobar, male   DOB: [birth year], 72 y.o.   MRN: [id number]  CSW met with pt to discuss team conference held yesterday.  CSW explained that pt is still working on same goals and that they are encouraged that he is sleeping better, seems more cheerful, and has been able to participate more.  Pt has not made much progress toward PT goals, but has progressed in his speech goals.  Pt agrees with the team in that regard, however, he is more discouraged today that things are not progressing as fast as he expected them to.  Pt also expressed concern for wife and the fact that everything has fallen to her. CSW provided active listening and support to pt and tried to encourage him.  Pt did smile with CSW a couple of times.  CSW tried to determine activities pt may like to do while not in therapy.  He hasn't had interest in reading, so has been watching TV mostly.  CSW will discuss with Recreation Therapist to find other activities of interest while pt is on the rehab unit.  CSW spoke with pt's wife via telephone and she remains positive and upbeat about pt's progress.  CSW updated her on pt's progress and that he continues to need 2 people for standing/walking.  She is aware of this and CSW spoke with her about continuing to assess this and how feasible it is for her to assist pt to the extent he may need.  She was realistic about the level of care he may require and that he may not always have two caregivers at home.  Wife remains supportive and encouraging of husband and is still willing to make any home modifications pt may need.  She appreciated CSW phone call and checking in with her.  She reports that she is coping well and handling household needs.  Wife is very Adult nurse of rehab services.  No concerns/needs noted at this time.  CSW will continue to follow pt and assist as needed.

## 2013-03-11 NOTE — Plan of Care (Signed)
Problem: RH SAFETY Goal: RH STG DECREASED RISK OF FALL WITH ASSISTANCE STG Decreased Risk of Fall With supervision  Outcome: Progressing Calls appropriately. Aware of his limitations

## 2013-03-11 NOTE — Progress Notes (Signed)
Physical Therapy Session Note  Patient Details  Name: Kevin Escobar MRN: [id number] Date of Birth: [birth year]  Today's Date: 03/11/2013 Time: 4098-1191 Time Calculation (min): 26 min  Short Term Goals: Week 2:  PT Short Term Goal 1 (Week 2): Pt will perform bed mobility with min assist and min cuing for technique.  PT Short Term Goal 2 (Week 2): Pt will perform transfers at moderate assist level.  PT Short Term Goal 3 (Week 2): Patient will be able to perform dynamic standing balance with Moderate assist. PT Short Term Goal 4 (Week 2): Patient will be able to perform static standing balance with minimum assist. PT Short Term Goal 5 (Week 2): Patient will be able to perform gait using LRAD x 10' with Moderate assist x 2.  Skilled Therapeutic Interventions/Progress Updates:    Patient received sitting up in bed, eating breakfast. Patient finished eating breakfast with supervision of therapist and remainder of session focused on bed mobility: rolling to B sides, sit<>supine, etc. Patient performed bed mobility initially with use of bed rails then progresses to no bed rails on flat surface. Patient rolls to R side with supervision/verbal cues for sequencing/technique, to L side with min A. Patient reports at home, he gets out of bed on L side; practiced supine<>sit to the L and patient requires maxA for supine>sit from flat surface and mod A for sit>supine. Practiced supine<>sit to the R side and patient requires maxA for supine>sit and sit>supine.  Patient left supine in bed with HOB elevated and bed alarm on with all needs within reach.  Therapy Documentation Precautions:  Precautions Precautions: Fall Precaution Comments: right hemiparesis  Restrictions Weight Bearing Restrictions: No Pain: Pain Assessment Pain Assessment: No/denies pain Pain Score: 0-No pain  See FIM for current functional status  Therapy/Group: Individual Therapy  Chipper Herb. Jlyn Bracamonte, PT,  DPT 03/11/2013, 8:56 AM

## 2013-03-11 NOTE — Progress Notes (Signed)
Occupational Therapy Weekly Progress Note  Patient Details  Name: Kevin Escobar MRN: [id number] Date of Birth: 03-29-1941  Today's Date: 03/11/2013 Time: 1030-1130 Time Calculation (min): 60 min  Patient has met 3 of 4 short term goals.  Pt is making steady progress towards goals.  He is sleeping better, seems more cheerful, and has been able to participate more. Pt has progressed to a min-mod assist for squat pivot transfers. Pt is min-mod assist sit to stand, min assist to maintain standing requiring blocking at Rt knee with improved standing tolerance while washing buttocks and pulling pants over hips.  Completed walk-in shower transfer with stand pivot using grab bar and requiring max assist due to unfamiliar task.    Patient continues to demonstrate the following deficits:dominant Rt hemiplegia, impaired sequencing, impaired postural control, static and dynamic sitting balance, static standing balance and therefore will continue to benefit from skilled OT intervention to enhance overall performance with BADL and Reduce care partner burden.  Patient progressing toward long term goals..  Continue plan of care.   OT Short Term Goals Week 2:  OT Short Term Goal 1 (Week 2): Pt will transfer to toilet/ BSC with mod A consistently  OT Short Term Goal 1 - Progress (Week 2): Met OT Short Term Goal 2 (Week 2): Pt will don pants with max assist OT Short Term Goal 2 - Progress (Week 2): Met OT Short Term Goal 3 (Week 2): Pt will wash BLE including feet with steady assist OT Short Term Goal 3 - Progress (Week 2): Progressing toward goal OT Short Term Goal 4 (Week 2): Pt will don shirt using hemi-technique with min assist OT Short Term Goal 4 - Progress (Week 2): Met Week 3:  OT Short Term Goal 1 (Week 3): Pt will wash BLE including feet with steady assist OT Short Term Goal 2 (Week 3): Pt will complete LB dressing with mod assist OT Short Term Goal 3 (Week 3): Pt will complete bathing with min  assist OT Short Term Goal 4 (Week 3): Pt will complete toilet transfer with min assist  Skilled Therapeutic Interventions/Progress Updates:    Pt seen for ADL retraining with focus on sit <> stand, standing tolerance, and increased participation in LB bathing and dressing tasks.  Pt's brother present during session, observing and asking appropriate questions.  Pt reports "sort of down" today and expecting to have made more progress to this point.  Throughout session discussed with pt progress made and increased initiation and participation with self-care tasks.   Pt completed bathing at sit to stand level at sink with increased focus on trunk control with standing and LB bathing and dressing.  Pt sit > stand with min assist this session with increased carryover of hand placement, pt did not require tactile cues at scapula to promote upright standing posture, however continues to require blocking at Rt knee for stability.  Pt able to wash periarea in standing this session, with this clinician blocking Rt knee to promote upright standing.  Pt with minimal Rt knee extension this session with decreased gravity, pt motivated by seeing foot advance. Pt able to don Lt brief and pant leg this session with increased time and attempted to pull pants and brief over hips however required assistance to pull over Lt hip.  Pt able to don and fasten Lt shoe with only setup assistance.  Pt's brother very encouraging through session and this clinician discussed with pt and pt's brother small victories and progress and  to focus on the positives.    Therapy Documentation Precautions:  Precautions Precautions: Fall Precaution Comments: right hemiparesis  Restrictions Weight Bearing Restrictions: No General:   Vital Signs: Therapy Vitals Pulse Rate: 68 BP: 138/90 mmHg Pain: Pain Assessment Pain Assessment: No/denies pain Pain Score: 0-No pain ADL: ADL Grooming: Minimal assistance Where Assessed-Grooming:  Sitting at sink Upper Body Bathing: Minimal assistance Where Assessed-Upper Body Bathing: Shower Lower Body Bathing: Moderate assistance Where Assessed-Lower Body Bathing: Shower Upper Body Dressing: Minimal assistance Where Assessed-Upper Body Dressing: Sitting at sink Lower Body Dressing: Maximal assistance Where Assessed-Lower Body Dressing: Sitting at sink;Standing at sink Toilet Transfer: Moderate assistance Toilet Transfer Method: Squat pivot Toilet Transfer Equipment: Acupuncturist: Maximal TEFL teacher Method: Warden/ranger: Transfer tub bench;Grab bars  See FIM for current functional status  Therapy/Group: Individual Therapy  Rosalio Loud 03/11/2013, 11:33 AM

## 2013-03-11 NOTE — Progress Notes (Signed)
Patient ID: Kevin Escobar, male   DOB: Jan 23, 1941, 71 y.o.   MRN: [id number] Subjective/Complaints: No pain c/os  Questions about CVA recovery ROS  A 12 point review of systems has been performed and if not noted above is otherwise negative.   Objective: Vital Signs: Blood pressure 140/90, pulse 58, temperature 97.9 F (36.6 C), temperature source Oral, resp. rate 19, weight 67.405 kg (148 lb 9.6 oz), SpO2 97.00%. Dg Swallowing Func-speech Pathology    Results for orders placed during the hospital encounter of 02/26/13 (from the past 72 hour(s))  BASIC METABOLIC PANEL     Status: Abnormal   Collection Time    03/11/13  5:45 AM      Result Value Range   Sodium 138  135 - 145 mEq/L   Potassium 3.9  3.5 - 5.1 mEq/L   Chloride 105  96 - 112 mEq/L   CO2 20  19 - 32 mEq/L   Glucose, Bld 76  70 - 99 mg/dL   BUN 35 (*) 6 - 23 mg/dL   Creatinine, Ser 2.13 (*) 0.50 - 1.35 mg/dL   Calcium 9.4  8.4 - 08.6 mg/dL   GFR calc non Af Amer 22 (*) >90 mL/min   GFR calc Af Amer 26 (*) >90 mL/min   Comment:            The eGFR has been calculated     using the CKD EPI equation.     This calculation has not been     validated in all clinical     situations.     eGFR's persistently     <90 mL/min signify     possible Chronic Kidney Disease.     HEENT: Eyes not injected, oral mucosa moist, external ears normal Cardio: RRR and No murmur Resp: CTA B/L and unlabored GI: BS positive and Nontender Extremity:  Pulses positive and No Edema Skin:   Intact, right heel boggy Neuro: More Alert/Oriented, Flat, Cranial Nerve Abnormalities Right central 7, Normal Sensory, Abnormal Motor tr to 1/5 in the right upper except 1/5 R finger flexors.(trace tone at wrist,fingers) RLE tr in HF, KE. 0/5 distally. Equinovarus position of right foot. Tone:    Dysarthria continues Musc/Skel:  Normal General no acute distress   Assessment/Plan: 1. Functional deficits secondary to Left PLIC infarct right  hemiplegia which require 3+ hours per day of interdisciplinary therapy in a comprehensive inpatient rehab setting. Physiatrist is providing close team supervision and 24 hour management of active medical problems listed below. Physiatrist and rehab team continue to assess barriers to discharge/monitor patient progress toward functional and medical goals.  FIM: FIM - Bathing Bathing Steps Patient Completed: Chest;Right Arm;Abdomen;Front perineal area;Right upper leg;Left upper leg;Buttocks Bathing: 3: Mod-Patient completes 5-7 72f 10 parts or 50-74%  FIM - Upper Body Dressing/Undressing Upper body dressing/undressing steps patient completed: Thread/unthread left sleeve of pullover shirt/dress;Pull shirt over trunk;Put head through opening of pull over shirt/dress Upper body dressing/undressing: 4: Min-Patient completed 75 plus % of tasks FIM - Lower Body Dressing/Undressing Lower body dressing/undressing steps patient completed: Thread/unthread left pants leg;Pull pants up/down Lower body dressing/undressing: 1: Total-Patient completed less than 25% of tasks  FIM - Toileting Toileting: 0: Activity did not occur  FIM - Diplomatic Services operational officer Devices: Grab bars Toilet Transfers: 3-To toilet/BSC: Mod A (lift or lower assist);3-From toilet/BSC: Mod A (lift or lower assist)  FIM - Bed/Chair Transfer Bed/Chair Transfer Assistive Devices: Arm rests Bed/Chair Transfer: 3: Supine > Sit: Mod  A (lifting assist/Pt. 50-74%/lift 2 legs;4: Sit > Supine: Min A (steadying pt. > 75%/lift 1 leg);3: Bed > Chair or W/C: Mod A (lift or lower assist);3: Chair or W/C > Bed: Mod A (lift or lower assist)  FIM - Locomotion: Wheelchair Distance: 150 Locomotion: Wheelchair: 3: Travels 150 ft or more: maneuvers on rugs and over door sills with moderate assistance  (Pt: 50 - 74%) FIM - Locomotion: Ambulation Locomotion: Ambulation Assistive Devices:  (L handrail) Ambulation/Gait Assistance: Not  tested (comment) Locomotion: Ambulation: 0: Activity did not occur  Comprehension Comprehension Mode: Auditory Comprehension: 5-Follows basic conversation/direction: With extra time/assistive device  Expression Expression Mode: Verbal Expression: 3-Expresses basic 50 - 74% of the time/requires cueing 25 - 50% of the time. Needs to repeat parts of sentences.  Social Interaction Social Interaction Mode: Asleep Social Interaction: 4-Interacts appropriately 75 - 89% of the time - Needs redirection for appropriate language or to initiate interaction.  Problem Solving Problem Solving Mode: Asleep Problem Solving: 5-Solves basic 90% of the time/requires cueing < 10% of the time  Memory Memory Mode: Asleep Memory: 4-Recognizes or recalls 75 - 89% of the time/requires cueing 10 - 24% of the time   Medical Problem List and Plan:  1. DVT Prophylaxis/Anticoagulation: Pharmaceutical: Lovenox  2. Pain Management: will add scheduled hs tramadol for back pain. May help with sleep as well. 3. Mood: Provide anxiety/mood support. Has a supportive family. On nicotine patch  -changed to lexapro 5mg  and give earlier in evening to reduce am sedation 4. Neuropsych: This patient is capable of making decisions on his own behalf.  5. HTN: will monitor with bid checks. Continue metoprolol, norvasc and catapres. Off lasix due to renal insufficiency/dysphagia diet. Mainly systolic elevation. Clonidine recently adjusted. No changes at  present 6. Reactive leucocytosis: Resolving.  7. Dyslipidemia: Continue lipitor.  8. Dysphagia: continue D2, nectar liquids. Continue to educate patient and family on compliance of current restrictions and aspiration risk.   9. Prostate cancer/bladder CA: H/o frequency. Toilet every 2-3 hours to help with continence. Condom cath at night.  10. CKD: Will monitor with routine checks. Currently at baseline.     LOS (Days) 13 A FACE TO FACE EVALUATION WAS  PERFORMED  KIRSTEINS,ANDREW E 03/11/2013, 8:43 AM

## 2013-03-11 NOTE — Progress Notes (Signed)
Speech Language Pathology Daily Session Note  Patient Details  Name: Kevin Escobar MRN: [id number] Date of Birth: [birth year]  Today's Date: 03/11/2013 Time: 0945-1000 Time Calculation (min): 15 min  Short Term Goals: Week 2: SLP Short Term Goal 1 (Week 2): The patient will perform oral motor and pharyngeal strengthening exercises with Supervision cueing.  SLP Short Term Goal 2 (Week 2): Patient will perform diaphragmatic exercises to increase vocal intensity, with Min A multimodal cueing.  SLP Short Term Goal 3 (Week 2): Patient will utilize compensatory strategies to increase speech intelligibility at the phrase level with Min verbal cues.   SLP Short Term Goal 4 (Week 2): Patient will consume Dys.2 textures and thin liquids with minimal s/s of aspiration.   Skilled Therapeutic Interventions: Skilled treatment focused on dysphagia goals. SLP facilitated session with Min verbal/visual cues to perform previously taught pharyngeal strenghting exercises.  SLP also facilitated session with MAx cues faded to Mod cues to utilize increased vocal intesity at phrase-sentence level of verbal expression, cues were effective at assistig patient to better direct caregiver during positioning.  Continue plan of care.   FIM:  Comprehension Comprehension Mode: Auditory Comprehension: 5-Follows basic conversation/direction: With extra time/assistive device Expression Expression Mode: Verbal Expression: 3-Expresses basic 50 - 74% of the time/requires cueing 25 - 50% of the time. Needs to repeat parts of sentences. Social Interaction Social Interaction: 5-Interacts appropriately 90% of the time - Needs monitoring or encouragement for participation or interaction. Problem Solving Problem Solving: 5-Solves basic 90% of the time/requires cueing < 10% of the time Memory Memory: 4-Recognizes or recalls 75 - 89% of the time/requires cueing 10 - 24% of the time FIM - Eating Eating Activity: 5:  Supervision/cues  Pain Pain Assessment Pain Assessment: No/denies pain  Therapy/Group: Individual Therapy  Charlane Ferretti., CCC-SLP [phone]  Kevin Escobar 03/11/2013, 1:21 PM

## 2013-03-11 NOTE — Plan of Care (Signed)
Problem: RH BLADDER ELIMINATION Goal: RH STG MANAGE BLADDER WITH ASSISTANCE STG Manage Bladder With Min Assistance.  Outcome: Progressing Pt uses urinal. Requires staff to assist with removal of brief.

## 2013-03-11 NOTE — Progress Notes (Addendum)
Speech Language Pathology Daily Session Note  Patient Details  Name: Kevin Escobar MRN: [id number] Date of Birth: 10-Apr-1941  Today's Date: 03/11/2013 Time: 1130-1210 Time Calculation (min): 40 min  Short Term Goals: Week 2: SLP Short Term Goal 1 (Week 2): The patient will perform oral motor and pharyngeal strengthening exercises with Supervision cueing.  SLP Short Term Goal 2 (Week 2): Patient will perform diaphragmatic exercises to increase vocal intensity, with Min A multimodal cueing.  SLP Short Term Goal 3 (Week 2): Patient will utilize compensatory strategies to increase speech intelligibility at the phrase level with Min verbal cues.   SLP Short Term Goal 4 (Week 2): Patient will consume Dys.2 textures and thin liquids with minimal s/s of aspiration.  SLP Short Term Goal 5 (Week 2): Patient will self-monitor and correct wet vocal quality with Min verbal cues.  Skilled Therapeutic Interventions: Skilled treatment session with peers focused on addressing dysphagia and self-care goals.  SLP facilitated session with Min physical assist for tray set-up and Supervision level verbal cues to carryover recommended safe swallow strategies.  Patient also required cues to request help appropriately, i.e. cutting food into small pieces to facilitate safe p.o. intake. Patient consumed Dys.2 textures and thin liquids as well as trials of Dys.3 with intermittent wet vocal quality with Min cues to self-monitor and correct as well as a cough x1; however, hard reflexive cough appeared to clear suspected penetrates.  Recommend to continue with current plan of care.  FIM:  Comprehension Comprehension Mode: Auditory Comprehension: 5-Follows basic conversation/direction: With extra time/assistive device Expression Expression Mode: Verbal Expression: 3-Expresses basic 50 - 74% of the time/requires cueing 25 - 50% of the time. Needs to repeat parts of sentences. Social Interaction Social Interaction:  5-Interacts appropriately 90% of the time - Needs monitoring or encouragement for participation or interaction. Problem Solving Problem Solving: 5-Solves basic 90% of the time/requires cueing < 10% of the time Memory Memory: 4-Recognizes or recalls 75 - 89% of the time/requires cueing 10 - 24% of the time FIM - Eating Eating Activity: 5: Supervision/cues  Pain Pain Assessment Pain Assessment: No/denies pain  Therapy/Group: Individual Therapy  Charlane Ferretti., CCC-SLP [phone]  Candido Flott 03/11/2013, 1:30 PM

## 2013-03-12 ENCOUNTER — Inpatient Hospital Stay (HOSPITAL_COMMUNITY): Payer: Medicare Other | Admitting: Physical Therapy

## 2013-03-12 ENCOUNTER — Inpatient Hospital Stay (HOSPITAL_COMMUNITY): Payer: Medicare Other | Admitting: Occupational Therapy

## 2013-03-12 ENCOUNTER — Inpatient Hospital Stay (HOSPITAL_COMMUNITY): Payer: Medicare Other | Admitting: Speech Pathology

## 2013-03-12 ENCOUNTER — Encounter (HOSPITAL_COMMUNITY): Payer: Medicare Other

## 2013-03-12 DIAGNOSIS — I69991 Dysphagia following unspecified cerebrovascular disease: Secondary | ICD-10-CM

## 2013-03-12 DIAGNOSIS — I6330 Cerebral infarction due to thrombosis of unspecified cerebral artery: Secondary | ICD-10-CM

## 2013-03-12 MED ORDER — ZOLPIDEM TARTRATE 5 MG PO TABS
5.0000 mg | ORAL_TABLET | Freq: Every day | ORAL | Status: DC
  Administered 2013-03-12 – 2013-03-25 (×14): 5 mg via ORAL
  Filled 2013-03-12 (×13): qty 1

## 2013-03-12 NOTE — Progress Notes (Signed)
Patient ID: Kevin Escobar, male   DOB: July 04, 1941, 72 y.o.   MRN: [id number] Subjective/Complaints: No pain c/os  Questions about CVA recovery ROS  A 12 point review of systems has been performed and if not noted above is otherwise negative.   Objective: Vital Signs: Blood pressure 138/58, pulse 61, temperature 97.4 F (36.3 C), temperature source Oral, resp. rate 17, weight 67.405 kg (148 lb 9.6 oz), SpO2 96.00%. Dg Swallowing Func-speech Pathology    Results for orders placed during the hospital encounter of 02/26/13 (from the past 72 hour(s))  BASIC METABOLIC PANEL     Status: Abnormal   Collection Time    03/11/13  5:45 AM      Result Value Range   Sodium 138  135 - 145 mEq/L   Potassium 3.9  3.5 - 5.1 mEq/L   Chloride 105  96 - 112 mEq/L   CO2 20  19 - 32 mEq/L   Glucose, Bld 76  70 - 99 mg/dL   BUN 35 (*) 6 - 23 mg/dL   Creatinine, Ser 4.09 (*) 0.50 - 1.35 mg/dL   Calcium 9.4  8.4 - 81.1 mg/dL   GFR calc non Af Amer 22 (*) >90 mL/min   GFR calc Af Amer 26 (*) >90 mL/min   Comment:            The eGFR has been calculated     using the CKD EPI equation.     This calculation has not been     validated in all clinical     situations.     eGFR's persistently     <90 mL/min signify     possible Chronic Kidney Disease.     HEENT: Eyes not injected, oral mucosa moist, external ears normal Cardio: RRR and No murmur Resp: CTA B/L and unlabored GI: BS positive and Nontender Extremity:  Pulses positive and No Edema Skin:   Intact, right heel boggy Neuro: More Alert/Oriented, Flat, Cranial Nerve Abnormalities Right central 7, Normal Sensory, Abnormal Motor tr to 1/5 in the right upper except 1/5 R finger flexors.(trace tone at wrist,fingers) RLE tr in HF, KE. 0/5 distally. Equinovarus position of right foot. Tone:    Dysarthria continues Musc/Skel:  Normal General no acute distress   Assessment/Plan: 1. Functional deficits secondary to Left PLIC infarct right  hemiplegia which require 3+ hours per day of interdisciplinary therapy in a comprehensive inpatient rehab setting. Physiatrist is providing close team supervision and 24 hour management of active medical problems listed below. Physiatrist and rehab team continue to assess barriers to discharge/monitor patient progress toward functional and medical goals.  FIM: FIM - Bathing Bathing Steps Patient Completed: Chest;Right Arm;Abdomen;Front perineal area;Buttocks;Right upper leg;Left upper leg Bathing: 3: Mod-Patient completes 5-7 36f 10 parts or 50-74%  FIM - Upper Body Dressing/Undressing Upper body dressing/undressing steps patient completed: Thread/unthread left sleeve of pullover shirt/dress;Pull shirt over trunk;Put head through opening of pull over shirt/dress Upper body dressing/undressing: 4: Min-Patient completed 75 plus % of tasks FIM - Lower Body Dressing/Undressing Lower body dressing/undressing steps patient completed: Thread/unthread left underwear leg;Thread/unthread left pants leg;Don/Doff left shoe;Fasten/unfasten left shoe Lower body dressing/undressing: 2: Max-Patient completed 25-49% of tasks  FIM - Toileting Toileting: 0: Activity did not occur  FIM - Diplomatic Services operational officer Devices: Grab bars Toilet Transfers: 3-To toilet/BSC: Mod A (lift or lower assist);3-From toilet/BSC: Mod A (lift or lower assist)  FIM - Bed/Chair Transfer Bed/Chair Transfer Assistive Devices: Arm rests Bed/Chair Transfer: 4: Supine >  Sit: Min A (steadying Pt. > 75%/lift 1 leg);4: Sit > Supine: Min A (steadying pt. > 75%/lift 1 leg);3: Bed > Chair or W/C: Mod A (lift or lower assist);3: Chair or W/C > Bed: Mod A (lift or lower assist)  FIM - Locomotion: Wheelchair Distance: 150 Locomotion: Wheelchair: 1: Total Assistance/staff pushes wheelchair (Pt<25%) FIM - Locomotion: Ambulation Locomotion: Ambulation Assistive Devices: Other (comment) (GiveMohr sling and wall  rail) Ambulation/Gait Assistance: 2: Max assist Locomotion: Ambulation: 1: Travels less than 50 ft with maximal assistance (Pt: 25 - 49%)  Comprehension Comprehension Mode: Auditory Comprehension: 5-Follows basic conversation/direction: With extra time/assistive device  Expression Expression Mode: Verbal Expression: 3-Expresses basic 50 - 74% of the time/requires cueing 25 - 50% of the time. Needs to repeat parts of sentences.  Social Interaction Social Interaction Mode: Asleep Social Interaction: 5-Interacts appropriately 90% of the time - Needs monitoring or encouragement for participation or interaction.  Problem Solving Problem Solving Mode: Asleep Problem Solving: 5-Solves basic 90% of the time/requires cueing < 10% of the time  Memory Memory Mode: Asleep Memory: 4-Recognizes or recalls 75 - 89% of the time/requires cueing 10 - 24% of the time   Medical Problem List and Plan:  1. DVT Prophylaxis/Anticoagulation: Pharmaceutical: Lovenox  2. Pain Management: will add scheduled hs tramadol for back pain. 3. Mood: Provide anxiety/mood support. Has a supportive family. On nicotine patch  -changed to lexapro 5mg  and give earlier in evening to reduce am sedation, increase ambien for sleep monitor am sedation 4. Neuropsych: This patient is capable of making decisions on his own behalf.  5. HTN: will monitor with bid checks. Continue metoprolol, norvasc and catapres. Off lasix due to renal insufficiency/dysphagia diet. Mainly systolic elevation. Clonidine recently adjusted. No changes at  present 6. Reactive leucocytosis: Resolving.  7. Dyslipidemia: Continue lipitor.  8. Dysphagia: continue D2, nectar liquids. Continue to educate patient and family on compliance of current restrictions and aspiration risk.   9. Prostate cancer/bladder CA: H/o frequency. Toilet every 2-3 hours to help with continence. Condom cath at night.  10. CKD: Will monitor with routine checks. Currently at  baseline.     LOS (Days) 14 A FACE TO FACE EVALUATION WAS PERFORMED  KIRSTEINS,ANDREW E 03/12/2013, 9:42 AM

## 2013-03-12 NOTE — Progress Notes (Signed)
Speech Language Pathology Daily Session Note  Patient Details  Name: Kevin Escobar MRN: [id number] Date of Birth: [birth year]  Today's Date: 03/12/2013 Time: 1515-1600 Time Calculation (min): 45 min  Short Term Goals: Week 2: SLP Short Term Goal 1 (Week 2): The patient will perform oral motor and pharyngeal strengthening exercises with Supervision cueing.  SLP Short Term Goal 2 (Week 2): Patient will perform diaphragmatic exercises to increase vocal intensity, with Min A multimodal cueing.  SLP Short Term Goal 3 (Week 2): Patient will utilize compensatory strategies to increase speech intelligibility at the phrase level with Min verbal cues.   SLP Short Term Goal 4 (Week 2): Patient will consume Dys.2 textures and thin liquids with minimal s/s of aspiration.  SLP Short Term Goal 5 (Week 2): Patient will self-monitor and correct wet vocal quality with Min verbal cues.  Skilled Therapeutic Interventions: Skilled treatment session focused on addressing dysarthria goals.  SLP facilitated session with structured verbal description task with Mod verbal cues for word level expression and Max cues with phrases to increase vocal intensity.  Wife present for session and reported that he sounded even softer this afternoon, when compared to earlier.  SLP educated regarding impact of fatigue.  SLP also facilitated session by educating and demonstrating diaphragmatic breathing exercises to patient and wife.  Continue with current plan of care.    FIM:  Comprehension Comprehension Mode: Auditory Comprehension: 5-Follows basic conversation/direction: With extra time/assistive device Expression Expression Mode: Verbal Expression: 3-Expresses basic 50 - 74% of the time/requires cueing 25 - 50% of the time. Needs to repeat parts of sentences. Social Interaction Social Interaction: 5-Interacts appropriately 90% of the time - Needs monitoring or encouragement for participation or interaction. Problem  Solving Problem Solving: 5-Solves basic 90% of the time/requires cueing < 10% of the time Memory Memory: 4-Recognizes or recalls 75 - 89% of the time/requires cueing 10 - 24% of the time FIM - Eating Eating Activity: 5: Supervision/cues  Pain Pain Assessment Pain Assessment: No/denies pain Pain Score: 0-No pain  Therapy/Group: Individual Therapy  Charlane Ferretti., CCC-SLP [phone]  Kevin Escobar 03/12/2013, 4:47 PM

## 2013-03-12 NOTE — Progress Notes (Signed)
Speech Language Pathology Daily Session Note  Patient Details  Name: Kevin Escobar MRN: [id number] Date of Birth: 1940-09-01  Today's Date: 03/12/2013 Time: 4098-1191 Time Calculation (min): 45 min  Short Term Goals: Week 2: SLP Short Term Goal 1 (Week 2): The patient will perform oral motor and pharyngeal strengthening exercises with Supervision cueing.  SLP Short Term Goal 2 (Week 2): Patient will perform diaphragmatic exercises to increase vocal intensity, with Min A multimodal cueing.  SLP Short Term Goal 3 (Week 2): Patient will utilize compensatory strategies to increase speech intelligibility at the phrase level with Min verbal cues.   SLP Short Term Goal 4 (Week 2): Patient will consume Dys.2 textures and thin liquids with minimal s/s of aspiration.  SLP Short Term Goal 5 (Week 2): Patient will self-monitor and correct wet vocal quality with Min verbal cues.  Skilled Therapeutic Interventions: Skilled group treatment session focused on addressing dysphagia and self-care goals.  SLP facilitated session with Min physical assist for tray set-up and Supervision level verbal cues to carryover recommended safe swallow strategies.  Patient demonstrated use of hard effortful coughs x2 throughout meal and waited until suspected penetrates cleared prior to continuing p.o.  SLP also facilitated session with trials of regular textures with thin liquid cup sips to assist with reducing oral residue and a strong reflexive cough response x1; however, cough appeared to clear suspected penetrates.  Continue with current plan of care.     FIM:  Comprehension Comprehension Mode: Auditory Comprehension: 5-Follows basic conversation/direction: With extra time/assistive device Expression Expression Mode: Verbal Expression: 3-Expresses basic 50 - 74% of the time/requires cueing 25 - 50% of the time. Needs to repeat parts of sentences. Social Interaction Social Interaction: 5-Interacts appropriately 90%  of the time - Needs monitoring or encouragement for participation or interaction. Problem Solving Problem Solving: 5-Solves basic 90% of the time/requires cueing < 10% of the time Memory Memory: 4-Recognizes or recalls 75 - 89% of the time/requires cueing 10 - 24% of the time FIM - Eating Eating Activity: 5: Supervision/cues  Pain Pain Assessment Pain Assessment: No/denies pain  Therapy/Group: Group Therapy  Charlane Ferretti., CCC-SLP [phone]  Kevin Escobar 03/12/2013, 1:14 PM

## 2013-03-12 NOTE — Progress Notes (Signed)
Pt very anxious during bath.  PRN given.  Re-assessed pt and anxiety level had decreased.  Resting.  Will continue to monitor.Oretha Milch D

## 2013-03-12 NOTE — Progress Notes (Signed)
Occupational Therapy Note  Patient Details  Name: PRATT BRESS MRN: [id number] Date of Birth: 22-Aug-1940 Today's Date: 03/12/2013  Time:9:32-10:35am ( ) Pt seen for 1:1 OT session with focus on bed mobility, activity tolerance, safety awareness and transfers. Pt in bed upon arrival stating he was "very anxious" and that he did not sleep last night. Found nurse to report, with medication given about halfway through tx. Spoke to pt for several minutes to encourage him to get up for bathing and dressing and removed resting hand splint. Pt refused bathing, but said he would dress. Verbal cues during transfers to ease pt's anxiety. Once sitting at sink, pt began breathing heavily, verbal cues for pursed lip breathing. Pt washed face and hands before asking to go back to bed. Once back in bed, brief changed with pt rolling side to side and nurse arrived to remove catheter and administer meds. Pt able to donn L side of clothing while sitting up in bed, requiring assistance with R side.  Pt continued to require cueing for pursed lip breathing. Ended session with bed alarm set and call bell in place.   Orie Baxendale Hessie Diener 03/12/2013, 12:39 PM

## 2013-03-12 NOTE — Progress Notes (Signed)
Physical Therapy Weekly Progress Note  Patient Details  Name: Kevin Escobar MRN: [id number] Date of Birth: 05-Jul-1941  Today's Date: 03/12/2013 Time: 1352-1450 Time Calculation (min): 58 min  Patient has made good progress towards LTG and has met 3 of 5 short term goals.  Pt is currently min-mod A for bed mobility, bed <> w/c transfers, w/c mobility but continues to require mod-max A of one person for gait and stair negotiation.  Education with wife for bed mobility and basic transfers has been initiated.  Have discussed with patient the need for AFO for RLE for increased safety with gait; will pursue evaluation by P&O next week and begin gait training with appropriate orthosis.    Patient continues to demonstrate the following deficits: R hemiplegia with impaired motor control, timing, sequencing, impaired postural control, balance, gait, activity tolerance/endurance, increased falls risk and therefore will continue to benefit from skilled PT intervention to enhance overall performance with activity tolerance, balance, postural control, ability to compensate for deficits, functional use of  right upper extremity and right lower extremity and coordination.  Patient progressing toward long term goals..  Plan of care revisions: LTG downgraded to min A w/c level overall.  PT Short Term Goals Week 2:  PT Short Term Goal 1 (Week 2): Pt will perform bed mobility with min assist and min cuing for technique.  PT Short Term Goal 1 - Progress (Week 2): Met PT Short Term Goal 2 (Week 2): Pt will perform transfers at moderate assist level.  PT Short Term Goal 2 - Progress (Week 2): Met PT Short Term Goal 3 (Week 2): Patient will be able to perform dynamic standing balance with Moderate assist. PT Short Term Goal 3 - Progress (Week 2): Progressing toward goal PT Short Term Goal 4 (Week 2): Patient will be able to perform static standing balance with minimum assist. PT Short Term Goal 4 - Progress (Week  2): Progressing toward goal PT Short Term Goal 5 (Week 2): Patient will be able to perform gait using LRAD x 10' with Moderate assist x 2. PT Short Term Goal 5 - Progress (Week 2): Met Week 3:  PT Short Term Goal 1 (Week 3): Pt will perform bed mobility on flat bed consistently with min A and verbalizing sequence to wife PT Short Term Goal 2 (Week 3): Pt will performed bed <> w/c transfers to L and R consistently with min A PT Short Term Goal 3 (Week 3): Pt will perform w/c mobility in controlled and home environments with supervision x 150' PT Short Term Goal 4 (Week 3): Pt will perform ambulation with LRAD and appropriate orthoses x 25' and mod A PT Short Term Goal 5 (Week 3): Pt will ascend and descend 4 stairs with one rail, appropriate orthosis and mod A  Skilled Therapeutic Interventions/Progress Updates:   Pt's brother present for beginning of session and wife present for second half of session.  Pt reports having severe anxiety attack this am but agreeable to therapy.  Performed w/c mobility x 150' with min A to maintain LLE propulsion and maintain straight navigation.  Performed NMR during gait and stair training with GiveMohr sling to RUE and blue rocker AFO to RLE for ankle DF assistance and to assist with anterior translation of COG over RLE; see below for details.  Following gait and stair training, had long discussion with wife and pt about the reality of them being able to do stairs together upon D/C for home entry/exit.  Pt will  likely need second person there to assist with stairs (ambulatory or w/c bumping) and treatment to focus on transfers and stairs.     Therapy Documentation Precautions:  Precautions Precautions: Fall Precaution Comments: right hemiparesis  Restrictions Weight Bearing Restrictions: No Pain: Pain Assessment Pain Assessment: No/denies pain Pain Score: 0-No pain PAINAD (Pain Assessment in Advanced Dementia) Breathing: normal Critical Care Pain Observation  Tool (CPOT) Facial Expression: Relaxed, neutral Locomotion : Ambulation Ambulation/Gait Assistance: 2: Max Lawyer Distance: 150  Other Treatments: Treatments Neuromuscular Facilitation: Right;Upper Extremity;Lower Extremity;Activity to increase motor control;Activity to increase timing and sequencing;Activity to increase sustained activation;Activity to increase lateral weight shifting;Activity to increase anterior-posterior weight shifting;Forced use during gait in hall way x 25' x 2 with wall rail for LUE support, GiveMohr sling for RUE and blue rocker RLE with mod-max A with patient able to initiate swing phase with manual assistance for R foot placement and verbal and tactile cues for anterior translation of COG over RLE and cues for RLE extension in stance to allow for full LLE step length.  Performed stair training with LUE support on rail and +2 A for safety and to assist with lateral weight shifting to RLE and cues for RLE extension in stance for stabilization during LLE advancement and to assist with full RLE clearance to next step.  Pt became very fatigued at end and presented with significant trunk flexion; required total A to pivot back to w/c.    See FIM for current functional status  Therapy/Group: Individual Therapy  Edman Circle [facility] 03/12/2013, 3:02 PM

## 2013-03-13 ENCOUNTER — Inpatient Hospital Stay (HOSPITAL_COMMUNITY): Payer: Medicare Other | Admitting: *Deleted

## 2013-03-13 ENCOUNTER — Inpatient Hospital Stay (HOSPITAL_COMMUNITY): Payer: Medicare Other | Admitting: Speech Pathology

## 2013-03-13 MED ORDER — DICLOFENAC SODIUM 1 % TD GEL
2.0000 g | Freq: Four times a day (QID) | TRANSDERMAL | Status: DC
  Administered 2013-03-13 – 2013-03-26 (×31): 2 g via TOPICAL
  Filled 2013-03-13: qty 100

## 2013-03-13 NOTE — Progress Notes (Signed)
Patient ID: Kevin Escobar, male   DOB: 12-03-1940, 72 y.o.   MRN: [id number] Subjective/Complaints: Slept better with Ambien 10 mg ROS  A 12 point review of systems has been performed and if not noted above is otherwise negative.   Objective: Vital Signs: Blood pressure 138/81, pulse 59, temperature 97.9 F (36.6 C), temperature source Oral, resp. rate 17, weight 67.405 kg (148 lb 9.6 oz), SpO2 97.00%. Dg Swallowing Func-speech Pathology    Results for orders placed during the hospital encounter of 02/26/13 (from the past 72 hour(s))  BASIC METABOLIC PANEL     Status: Abnormal   Collection Time    03/11/13  5:45 AM      Result Value Range   Sodium 138  135 - 145 mEq/L   Potassium 3.9  3.5 - 5.1 mEq/L   Chloride 105  96 - 112 mEq/L   CO2 20  19 - 32 mEq/L   Glucose, Bld 76  70 - 99 mg/dL   BUN 35 (*) 6 - 23 mg/dL   Creatinine, Ser 4.09 (*) 0.50 - 1.35 mg/dL   Calcium 9.4  8.4 - 81.1 mg/dL   GFR calc non Af Amer 22 (*) >90 mL/min   GFR calc Af Amer 26 (*) >90 mL/min   Comment:            The eGFR has been calculated     using the CKD EPI equation.     This calculation has not been     validated in all clinical     situations.     eGFR's persistently     <90 mL/min signify     possible Chronic Kidney Disease.     HEENT: Eyes not injected, oral mucosa moist, external ears normal Cardio: RRR and No murmur Resp: CTA B/L and unlabored GI: BS positive and Nontender Extremity:  Pulses positive and No Edema Skin:   Intact, right heel boggy Neuro: More Alert/Oriented, Flat, Cranial Nerve Abnormalities Right central 7, Normal Sensory, Abnormal Motor tr to 1/5 in the right upper except 1/5 R finger flexors.(trace tone at wrist,fingers) RLE tr in HF, KE. 0/5 distally. Equinovarus position of right foot. Tone:    Dysarthria continues Musc/Skel:  Normal General no acute distress   Assessment/Plan: 1. Functional deficits secondary to Left PLIC infarct right hemiplegia which  require 3+ hours per day of interdisciplinary therapy in a comprehensive inpatient rehab setting. Physiatrist is providing close team supervision and 24 hour management of active medical problems listed below. Physiatrist and rehab team continue to assess barriers to discharge/monitor patient progress toward functional and medical goals.  FIM: FIM - Bathing Bathing Steps Patient Completed: Chest;Right Arm;Abdomen;Front perineal area;Buttocks;Right upper leg;Left upper leg Bathing: 0: Activity did not occur  FIM - Upper Body Dressing/Undressing Upper body dressing/undressing steps patient completed: Thread/unthread left sleeve of pullover shirt/dress;Put head through opening of pull over shirt/dress Upper body dressing/undressing: 0: Activity did not occur FIM - Lower Body Dressing/Undressing Lower body dressing/undressing steps patient completed: Thread/unthread left pants leg;Don/Doff left sock Lower body dressing/undressing: 0: Activity did not occur  FIM - Toileting Toileting: 0: Activity did not occur  FIM - Diplomatic Services operational officer Devices: Bedside commode Toilet Transfers: 3-To toilet/BSC: Mod A (lift or lower assist)  FIM - Banker Devices: Arm rests;Bed rails Bed/Chair Transfer: 3: Bed > Chair or W/C: Mod A (lift or lower assist);3: Chair or W/C > Bed: Mod A (lift or lower assist)  FIM -  Locomotion: Wheelchair Distance: 150 Locomotion: Wheelchair: 0: Activity did not occur FIM - Locomotion: Ambulation Locomotion: Ambulation Assistive Devices: Other (comment);Orthosis Ambulation/Gait Assistance: 2: Max assist Locomotion: Ambulation: 0: Activity did not occur  Comprehension Comprehension Mode: Auditory Comprehension: 5-Follows basic conversation/direction: With extra time/assistive device  Expression Expression Mode: Verbal Expression: 3-Expresses basic 50 - 74% of the time/requires cueing 25 - 50% of the time.  Needs to repeat parts of sentences.  Social Interaction Social Interaction Mode: Asleep Social Interaction: 5-Interacts appropriately 90% of the time - Needs monitoring or encouragement for participation or interaction.  Problem Solving Problem Solving Mode: Asleep Problem Solving: 5-Solves basic 90% of the time/requires cueing < 10% of the time  Memory Memory Mode: Asleep Memory: 4-Recognizes or recalls 75 - 89% of the time/requires cueing 10 - 24% of the time   Medical Problem List and Plan:  1. DVT Prophylaxis/Anticoagulation: Pharmaceutical: Lovenox  2. Pain Management: will add scheduled hs tramadol for back pain. 3. Mood: Provide anxiety/mood support. Has a supportive family. On nicotine patch  -changed to lexapro 5mg  and give earlier in evening to reduce am sedation, increase ambien for sleep monitor am sedation 4. Neuropsych: This patient is capable of making decisions on his own behalf.  5. HTN: will monitor with bid checks. Continue metoprolol, norvasc and catapres. Off lasix due to renal insufficiency/dysphagia diet. Mainly systolic elevation. Clonidine recently adjusted. No changes at  present 6. Reactive leucocytosis: Resolving.  7. Dyslipidemia: Continue lipitor.  8. Dysphagia: continue D2, nectar liquids. Continue to educate patient and family on compliance of current restrictions and aspiration risk.   9. Prostate cancer/bladder CA: H/o frequency. Toilet every 2-3 hours to help with continence. Condom cath at night.  10. CKD: Will monitor with routine checks. Currently at baseline.     LOS (Days) 15 A FACE TO FACE EVALUATION WAS PERFORMED  Lalitha Ilyas E 03/13/2013, 10:39 AM

## 2013-03-13 NOTE — Progress Notes (Signed)
Occupational Therapy Note  Patient Details  Name: HILTON SAEPHAN MRN: [id number] Date of Birth: [birth year] Today's Date: 03/13/2013  [employer] with SLP  1130-1200 No c/o pain Self feeding group with focus on activity tolerance, recalling safe swallowing strategies with min cuing (cue to listen to vocal quality); at first pt reports too many rules to doing this and wanted to leave- however able to encourage him to stay and participate. Pt required min A to setup tray. No s/s of aspiration with think liquids or dys 2 textures today.     Roney Mans [facility] 03/13/2013, 12:17 PM

## 2013-03-13 NOTE — Progress Notes (Signed)
Physical Therapy Session Note  Patient Details  Name: Kevin Escobar MRN: [id number] Date of Birth: 02-05-1941  Today's Date: 03/13/2013 Time: 1003-1033 Time Calculation (min): 30 min   Skilled Therapeutic Interventions/Progress Updates:  Patient in bed ,not dressed, modA to Roll to the affected side with verbal and tactile cues. Sitting EOB with modA, due to LOB to the R side. Mod to max A for dressing both lower and upper body. Sit to stand and transfer to the chair with mod A and constant cues for sequencing and safety.Patient was able to propel his w/c with stand by assist to and from the gym using L side UE and LE. Standing with holding to a side rail 2 x 1 min ,reduced tolerance due to SOB. Attempted gait training with holding to side rail and with AFO, patient is able to take few steps with max A and manual progression of L foot, looses balance easily and needs to sit down.Patient returned to his room left in a w/c with a safety  Belt and with all needs within reach.   Therapy Documentation Precautions:  Precautions Precautions: Fall Precaution Comments: right hemiparesis  Restrictions Weight Bearing Restrictions: No    See FIM for current functional status  Therapy/Group: Individual Therapy  Dorna Mai 03/13/2013, 10:50 AM

## 2013-03-13 NOTE — Progress Notes (Signed)
Speech Language Pathology Daily Session Note  Patient Details  Name: Kevin Escobar MRN: [id number] Date of Birth: 1940/08/22  Today's Date: 03/13/2013 Time: 1145-1200 Time Calculation (min): 15 min  Short Term Goals: Week 2: SLP Short Term Goal 1 (Week 2): The patient will perform oral motor and pharyngeal strengthening exercises with Supervision cueing.  SLP Short Term Goal 2 (Week 2): Patient will perform diaphragmatic exercises to increase vocal intensity, with Min A multimodal cueing.  SLP Short Term Goal 3 (Week 2): Patient will utilize compensatory strategies to increase speech intelligibility at the phrase level with Min verbal cues.   SLP Short Term Goal 4 (Week 2): Patient will consume Dys.2 textures and thin liquids with minimal s/s of aspiration.  SLP Short Term Goal 5 (Week 2): Patient will self-monitor and correct wet vocal quality with Min verbal cues.  Skilled Therapeutic Interventions: Therapeutic intervention completed during [employer] today.  Patient required min verbal cues to  alternate bites with sips,  verbal cue to clear and reswallow due to wet voice.   Continue with current treatment plan.    FIM:  Comprehension Comprehension Mode: Auditory Comprehension: 5-Follows basic conversation/direction: With extra time/assistive device Expression Expression Mode: Verbal Expression: 3-Expresses basic 50 - 74% of the time/requires cueing 25 - 50% of the time. Needs to repeat parts of sentences. Social Interaction Social Interaction: 5-Interacts appropriately 90% of the time - Needs monitoring or encouragement for participation or interaction. Problem Solving Problem Solving: 4-Solves basic 75 - 89% of the time/requires cueing 10 - 24% of the time Memory Memory: 4-Recognizes or recalls 75 - 89% of the time/requires cueing 10 - 24% of the time FIM - Eating Eating Activity: 5: Supervision/cues;5: Set-up assist for open containers  Pain Pain Assessment Pain  Assessment: No/denies pain  Therapy/Group: Group Therapy  Lenny Pastel 03/13/2013, 5:43 PM

## 2013-03-14 ENCOUNTER — Inpatient Hospital Stay (HOSPITAL_COMMUNITY): Payer: Medicare Other | Admitting: *Deleted

## 2013-03-14 MED ORDER — WHITE PETROLATUM GEL
Status: AC
  Filled 2013-03-14: qty 5

## 2013-03-14 NOTE — Progress Notes (Signed)
Occupational Therapy Session Note  Patient Details  Name: Kevin Escobar MRN: [id number] Date of Birth: 1941-05-13  Today's Date: 03/14/2013 Time:  -     1030-1130  (60 min)    Short Term Goals: Week 1:  OT Short Term Goal 1 (Week 1): Pt will don shirt using hemi dressing techniques with mod A  OT Short Term Goal 1 - Progress (Week 1): Met OT Short Term Goal 2 (Week 1): P t will transfer to toilet/ BSC with mod A consistently  OT Short Term Goal 2 - Progress (Week 1): Progressing toward goal OT Short Term Goal 3 (Week 1): Pt will demonstrate sitting balance EOB/EOM with supervision for 5 min  OT Short Term Goal 3 - Progress (Week 1): Met OT Short Term Goal 4 (Week 1): Pt will perform sit to stand with mod A for clothing management OT Short Term Goal 4 - Progress (Week 1): Met OT Short Term Goal 5 (Week 1): Pt will perform oral care with min A  OT Short Term Goal 5 - Progress (Week 1): Met Week 2:  OT Short Term Goal 1 (Week 2): Pt will transfer to toilet/ BSC with mod A consistently  OT Short Term Goal 1 - Progress (Week 2): Met OT Short Term Goal 2 (Week 2): Pt will don pants with max assist OT Short Term Goal 2 - Progress (Week 2): Met OT Short Term Goal 3 (Week 2): Pt will wash BLE including feet with steady assist OT Short Term Goal 3 - Progress (Week 2): Progressing toward goal OT Short Term Goal 4 (Week 2): Pt will don shirt using hemi-technique with min assist OT Short Term Goal 4 - Progress (Week 2): Met  Skilled Therapeutic Interventions/Progress Updates:    Focus of treatment was bed mobility, transfers,  Neuro-muscular reeducation, sitting balance, standing balance, attention, therapeutic activities, sustained attention, postural control.  Transferred from wc to shower bench with mod assist.  Pt. Bathed right arm with OT lifting it up for thoroughness.  Pt leaned over to wash feet but could only reach to ankles.   Pt went form sit to stand with minimal assist and able to  stand for 1 minute for pericare.  Pt. Needed rest breaks 4-5 time during session.  Sat at sink to dress and brush teeth with set up.  Left pt in wc with friend, Mosetta Putt.       Therapy Documentation Precautions:  Precautions Precautions: Fall Precaution Comments: right hemiparesis  Restrictions Weight Bearing Restrictions: No       Pain:  none           See FIM for current functional status  Therapy/Group: Individual Therapy  Humberto Seals 03/14/2013, 10:41 AM

## 2013-03-14 NOTE — Progress Notes (Signed)
Patient ID: Kevin Escobar, male   DOB: 1941-02-25, 72 y.o.   MRN: [id number] Subjective/Complaints: Slept better with Ambien 10 mg, no new issues ROS  A 12 point review of systems has been performed and if not noted above is otherwise negative.   Objective: Vital Signs: Blood pressure 139/82, pulse 55, temperature 98 F (36.7 C), temperature source Oral, resp. rate 16, weight 67.405 kg (148 lb 9.6 oz), SpO2 97.00%.     No results found for this or any previous visit (from the past 72 hour(s)).   HEENT: Eyes not injected, oral mucosa moist, external ears normal Cardio: RRR and No murmur Resp: CTA B/L and unlabored GI: BS positive and Nontender Extremity:  Pulses positive and No Edema Skin:   Intact, right heel boggy Neuro: More Alert/Oriented, Flat, Cranial Nerve Abnormalities Right central 7, Normal Sensory, Abnormal Motor tr to 1/5 in the right upper except 1/5 R finger flexors.(trace tone at wrist,fingers) RLE tr in HF, KE. 1/5 distally. Equinovarus position of right foot. Tone:    Dysarthria continues Musc/Skel:  Normal General no acute distress   Assessment/Plan: 1. 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 Physiatrist is providing close team supervision and 24 hour management of active medical problems listed below. Physiatrist and rehab team continue to assess barriers to discharge/monitor patient progress toward functional and medical goals.  FIM: FIM - Bathing Bathing Steps Patient Completed: Chest;Right Arm;Abdomen;Front perineal area;Buttocks;Right upper leg;Left upper leg Bathing: 0: Activity did not occur  FIM - Upper Body Dressing/Undressing Upper body dressing/undressing steps patient completed: Thread/unthread left sleeve of pullover shirt/dress;Put head through opening of pull over shirt/dress Upper body dressing/undressing: 0:  Activity did not occur FIM - Lower Body Dressing/Undressing Lower body dressing/undressing steps patient completed: Thread/unthread left pants leg;Don/Doff left sock Lower body dressing/undressing: 0: Activity did not occur  FIM - Toileting Toileting: 0: Activity did not occur  FIM - Diplomatic Services operational officer Devices: Psychiatrist Transfers: 0-Activity did not occur  FIM - Banker Devices: Arm rests Bed/Chair Transfer: 0: Activity did not occur  FIM - Locomotion: Wheelchair Distance: 150 Locomotion: Wheelchair: 0: Activity did not occur FIM - Locomotion: Ambulation Locomotion: Ambulation Assistive Devices: Other (comment) (side rails on the hall , AFO) Ambulation/Gait Assistance: 2: Max assist Locomotion: Ambulation: 0: Activity did not occur  Comprehension Comprehension Mode: Auditory Comprehension: 5-Follows basic conversation/direction: With extra time/assistive device  Expression Expression Mode: Verbal Expression: 3-Expresses basic 50 - 74% of the time/requires cueing 25 - 50% of the time. Needs to repeat parts of sentences.  Social Interaction Social Interaction Mode: Asleep Social Interaction: 5-Interacts appropriately 90% of the time - Needs monitoring or encouragement for participation or interaction.  Problem Solving Problem Solving Mode: Asleep Problem Solving: 4-Solves basic 75 - 89% of the time/requires cueing 10 - 24% of the time  Memory Memory Mode: Asleep Memory: 4-Recognizes or recalls 75 - 89% of the time/requires cueing 10 - 24% of the time   Medical Problem List and Plan:  1. DVT Prophylaxis/Anticoagulation: Pharmaceutical: Lovenox  2. Pain Management: will add scheduled hs tramadol for back pain. 3. Mood: Provide anxiety/mood support. Has a supportive family. On nicotine patch  -changed to lexapro 5mg  and give earlier in evening to reduce am sedation, increase ambien for sleep  monitor am sedation 4. Neuropsych: This patient is capable of making decisions on his own  behalf.  5. HTN: will monitor with bid checks. Continue metoprolol, norvasc and catapres. Off lasix due to renal insufficiency/dysphagia diet. Mainly systolic elevation. Clonidine recently adjusted. No changes at  present 6. Reactive leucocytosis: Resolving.  7. Dyslipidemia: Continue lipitor.  8. Dysphagia: continue D2, nectar liquids. Continue to educate patient and family on compliance of current restrictions and aspiration risk.   9. Prostate cancer/bladder CA: H/o frequency. Toilet every 2-3 hours to help with continence. Condom cath at night.  10. CKD: Will monitor with routine checks. Currently at baseline.     LOS (Days) 16 A FACE TO FACE EVALUATION WAS PERFORMED  KIRSTEINS,ANDREW E 03/14/2013, 10:01 AM

## 2013-03-15 ENCOUNTER — Inpatient Hospital Stay (HOSPITAL_COMMUNITY): Payer: Medicare Other | Admitting: Occupational Therapy

## 2013-03-15 ENCOUNTER — Inpatient Hospital Stay (HOSPITAL_COMMUNITY): Payer: Medicare Other | Admitting: Speech Pathology

## 2013-03-15 ENCOUNTER — Inpatient Hospital Stay (HOSPITAL_COMMUNITY): Payer: Medicare Other | Admitting: Physical Therapy

## 2013-03-15 DIAGNOSIS — I6330 Cerebral infarction due to thrombosis of unspecified cerebral artery: Secondary | ICD-10-CM

## 2013-03-15 DIAGNOSIS — I69991 Dysphagia following unspecified cerebrovascular disease: Secondary | ICD-10-CM

## 2013-03-15 NOTE — Progress Notes (Signed)
Speech Language Pathology Daily Session Note  Patient Details  Name: Kevin Escobar MRN: [id number] Date of Birth: [birth year]  Today's Date: 03/15/2013 Time: 1350-1430 Time Calculation (min): 40 min  Short Term Goals: Week 3: SLP Short Term Goal 1 (Week 3): The patient will perform oral motor, pharyngeal strengthening and diaphragmatic breathing exercises with Supervision cuing.  SLP Short Term Goal 2 (Week 3): Patient will utilize compensatory strategies to increase speech intelligibility at the phrase level with Min verbal cues.   SLP Short Term Goal 3 (Week 3): Patient will consume Dys.3 textures and thin liquids with minimal s/s of aspiration.  SLP Short Term Goal 4 (Week 3): Patient will self-monitor and correct wet vocal quality with Min verbal cues.  Skilled Therapeutic Interventions: Skilled treatment session focused on addressing dysarthria goals.  SLP facilitated session with Mod verbal cues to utilize increased vocal intensity at the phrase level during a structured task.  Patient also required Max cues to increase intensity as needed when background noise increased in therapy room.    FIM:  Comprehension Comprehension Mode: Auditory Comprehension: 5-Follows basic conversation/direction: With extra time/assistive device Expression Expression Mode: Verbal Expression: 3-Expresses basic 50 - 74% of the time/requires cueing 25 - 50% of the time. Needs to repeat parts of sentences. Social Interaction Social Interaction: 4-Interacts appropriately 75 - 89% of the time - Needs redirection for appropriate language or to initiate interaction. Problem Solving Problem Solving: 5-Solves basic 90% of the time/requires cueing < 10% of the time Memory Memory: 5-Recognizes or recalls 90% of the time/requires cueing < 10% of the time FIM - Eating Eating Activity: 5: Supervision/cues;5: Set-up assist for open containers;5: Set-up assist for cut food  Pain Pain Assessment Pain Assessment:  No/denies pain  Therapy/Group: Individual Therapy  Charlane Ferretti., CCC-SLP [phone]  Kevin Escobar 03/15/2013, 4:31 PM

## 2013-03-15 NOTE — Progress Notes (Signed)
Occupational Therapy Session Note  Patient Details  Name: Kevin Escobar MRN: [id number] Date of Birth: 02-02-1941  Today's Date: 03/15/2013 Time: 1130-1200 Time Calculation (min): 30 min  Short Term Goals: Week 3:  OT Short Term Goal 1 (Week 3): Pt will wash BLE including feet with steady assist OT Short Term Goal 2 (Week 3): Pt will complete LB dressing with mod assist OT Short Term Goal 3 (Week 3): Pt will complete bathing with min assist OT Short Term Goal 4 (Week 3): Pt will complete toilet transfer with min assist  Skilled Therapeutic Interventions: Patient participated in "[employer]" group therapy session this date with emphasis on use of R-UE as stabilizer, practice with alternating solids with liquids during self-feeding to reduce aspiration risk, and attention to right.   Patient required mod assist to cut food portions to size, min verbal cues to attend to right, and hand-over-hand assist to incorporate R-UE as stabilizer.  Patient reported fatigue, initially, but engaged in self-feeding as directed.   Per SLP, patient has improved to Dys 3 restrictions.   Patient able to tolerate thin liquids well during this session.    Therapy Documentation Precautions:  Precautions Precautions: Fall Precaution Comments: right hemiparesis  Restrictions Weight Bearing Restrictions: No  Pain: No report of pain  See FIM for current functional status  Therapy/Group: Group Therapy  Georgeanne Nim 03/15/2013, 1:16 PM

## 2013-03-15 NOTE — Progress Notes (Signed)
Speech Language Pathology Daily Session Note  Patient Details  Name: Kevin Escobar MRN: [id number] Date of Birth: 1940/10/12  Today's Date: 03/15/2013 Time: 1200-1210 Time Calculation (min): 10 min  Short Term Goals: Week 3: SLP Short Term Goal 1 (Week 3): The patient will perform oral motor, pharyngeal strengthening and diaphragmatic breathing exercises with Supervision cuing.  SLP Short Term Goal 2 (Week 3): Patient will utilize compensatory strategies to increase speech intelligibility at the phrase level with Min verbal cues.   SLP Short Term Goal 3 (Week 3): Patient will consume Dys.3 textures and thin liquids with minimal s/s of aspiration.  SLP Short Term Goal 4 (Week 3): Patient will self-monitor and correct wet vocal quality with Min verbal cues.  Skilled Therapeutic Interventions: Skilled group co- treatment session with OT focused on addressing self-care goals and SLP facilitated session by addressing dysphagia goals.  SLP facilitated session with trial tray upgrade of Dys.3 textures and thin liquids.  Patient required Min-Mod verbal cues to utilize safe swallow strategies and demonstrated intermittent coughs throughout session.  Cough response appeared to clear suspected penetrates.  Continue with diet upgrade and full supervision with p.o.    FIM:  Comprehension Comprehension Mode: Auditory Comprehension: 5-Follows basic conversation/direction: With extra time/assistive device Expression Expression Mode: Verbal Expression: 3-Expresses basic 50 - 74% of the time/requires cueing 25 - 50% of the time. Needs to repeat parts of sentences. Social Interaction Social Interaction: 4-Interacts appropriately 75 - 89% of the time - Needs redirection for appropriate language or to initiate interaction. Problem Solving Problem Solving: 5-Solves basic 90% of the time/requires cueing < 10% of the time Memory Memory: 5-Recognizes or recalls 90% of the time/requires cueing < 10% of the  time FIM - Eating Eating Activity: 5: Supervision/cues;5: Set-up assist for open containers;5: Set-up assist for cut food  Pain Pain Assessment Pain Assessment: No/denies pain  Therapy/Group: Group Therapy  Charlane Ferretti., CCC-SLP [phone]  Kevin Escobar 03/15/2013, 4:28 PM

## 2013-03-15 NOTE — Progress Notes (Signed)
Speech Language Pathology Weekly Progress Note  Patient Details  Name: AUDIEL SCHEIBER MRN: [id number] Date of Birth: 11/12/1940  Today's Date: 03/15/2013  Short Term Goals: Week 2: SLP Short Term Goal 1 (Week 2): The patient will perform oral motor and pharyngeal strengthening exercises with Supervision cueing.  SLP Short Term Goal 1 - Progress (Week 2): Progressing toward goal SLP Short Term Goal 2 (Week 2): Patient will perform diaphragmatic exercises to increase vocal intensity, with Min A multimodal cueing.  SLP Short Term Goal 2 - Progress (Week 2): Progressing toward goal SLP Short Term Goal 3 (Week 2): Patient will utilize compensatory strategies to increase speech intelligibility at the phrase level with Min verbal cues.   SLP Short Term Goal 3 - Progress (Week 2): Progressing toward goal SLP Short Term Goal 4 (Week 2): Patient will consume Dys.2 textures and thin liquids with minimal s/s of aspiration.  SLP Short Term Goal 4 - Progress (Week 2): Met SLP Short Term Goal 5 (Week 2): Patient will self-monitor and correct wet vocal quality with Min verbal cues. SLP Short Term Goal 5 - Progress (Week 2): Progressing toward goal Week 3: SLP Short Term Goal 1 (Week 3): The patient will perform oral motor, pharyngeal strengthening and diaphragmatic breathing exercises with Supervision cuing.  SLP Short Term Goal 2 (Week 3): Patient will utilize compensatory strategies to increase speech intelligibility at the phrase level with Min verbal cues.   SLP Short Term Goal 3 (Week 3): Patient will consume Dys.3 textures and thin liquids with minimal s/s of aspiration.  SLP Short Term Goal 4 (Week 3): Patient will self-monitor and correct wet vocal quality with Min verbal cues.  Weekly Progress Updates: Patient met 1 out of 5 short term objectives this reporting period due to diet advancement and toleration.  Patient is consuming Dys.2 textures and thin liquids with minimal s/s of aspiration and Min  cues for safety.  Patient continues to require Min cues to perform oral motor and pharyngeal strengthening exercises, and Mod cues for diaphragmatic breathing and increased use/carryover of speech intelligibility strategies.  As a result, goals are being continued and diet goals are set for Dys.3 textures and thin liquids.  This patient continues to require skilled SLP services to address these deficits and reduce burden of care prior to discharge home with wife.   SLP Intensity: Minumum of 1-2 x/day, 30 to 90 minutes SLP Frequency: 5 out of 7 days SLP Duration/Estimated Length of Stay: 8/22 SLP Treatment/Interventions: Cueing hierarchy;Dysphagia/aspiration precaution training;Functional tasks;Internal/external aids;Oral motor exercises;Patient/family education;Therapeutic Activities  Charlane Ferretti., CCC-SLP [phone]  Maanya Hippert 03/15/2013, 11:16 AM

## 2013-03-15 NOTE — Progress Notes (Signed)
Patient ID: Kevin Escobar, male   DOB: [birth year], 72 y.o.   MRN: [id number] Subjective/Complaints: Slept better with Ambien 10 mg, no pain c/os, still weak on Right with spasticity ROS  A 12 point review of systems has been performed and if not noted above is otherwise negative.   Objective: Vital Signs: Blood pressure 125/81, pulse 62, temperature 97.8 F (36.6 C), temperature source Oral, resp. rate 20, weight 67.405 kg (148 lb 9.6 oz), SpO2 96.00%.     No results found for this or any previous visit (from the past 72 hour(s)).   HEENT: Eyes not injected, oral mucosa moist, external ears normal Cardio: RRR and No murmur Resp: CTA B/L and unlabored GI: BS positive and Nontender Extremity:  Pulses positive and No Edema Skin:   Intact, right heel boggy Neuro: More Alert/Oriented, Flat, Cranial Nerve Abnormalities Right central 7, Normal Sensory, Abnormal Motor tr to 1/5 in the right upper except 1/5 R finger flexors.(trace tone at wrist,fingers) RLE tr in HF, KE. 1/5 distally. Equinovarus position of right foot. Tone:    Dysarthria continues Musc/Skel:  Normal General no acute distress   Assessment/Plan: 1. 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 Physiatrist is providing close team supervision and 24 hour management of active medical problems listed below. Physiatrist and rehab team continue to assess barriers to discharge/monitor patient progress toward functional and medical goals.  FIM: FIM - Bathing Bathing Steps Patient Completed: Chest;Right Arm;Abdomen;Front perineal area;Buttocks;Right upper leg;Left upper leg Bathing: 3: Mod-Patient completes 5-7 88f 10 parts or 50-74%  FIM - Upper Body Dressing/Undressing Upper body dressing/undressing steps patient completed: Thread/unthread left sleeve of pullover shirt/dress;Put head through opening of  pull over shirt/dress;Pull shirt over trunk Upper body dressing/undressing: 3: Mod-Patient completed 50-74% of tasks FIM - Lower Body Dressing/Undressing Lower body dressing/undressing steps patient completed: Thread/unthread left pants leg Lower body dressing/undressing: 2: Max-Patient completed 25-49% of tasks  FIM - Toileting Toileting: 0: Activity did not occur  FIM - Diplomatic Services operational officer Devices: Psychiatrist Transfers: 0-Activity did not occur  FIM - Banker Devices: Arm rests Bed/Chair Transfer: 1: Two helpers  FIM - Locomotion: Wheelchair Distance: 150 Locomotion: Wheelchair: 0: Activity did not occur FIM - Locomotion: Ambulation Locomotion: Ambulation Assistive Devices: Other (comment) (side rails on the hall , AFO) Ambulation/Gait Assistance: 2: Max assist Locomotion: Ambulation: 0: Activity did not occur  Comprehension Comprehension Mode: Auditory Comprehension: 5-Follows basic conversation/direction: With extra time/assistive device  Expression Expression Mode: Verbal Expression: 3-Expresses basic 50 - 74% of the time/requires cueing 25 - 50% of the time. Needs to repeat parts of sentences.  Social Interaction Social Interaction Mode: Asleep Social Interaction: 5-Interacts appropriately 90% of the time - Needs monitoring or encouragement for participation or interaction.  Problem Solving Problem Solving Mode: Asleep Problem Solving: 4-Solves basic 75 - 89% of the time/requires cueing 10 - 24% of the time  Memory Memory Mode: Asleep Memory: 4-Recognizes or recalls 75 - 89% of the time/requires cueing 10 - 24% of the time   Medical Problem List and Plan:  1. DVT Prophylaxis/Anticoagulation: Pharmaceutical: Lovenox  2. Pain Management: will add scheduled hs tramadol for back pain. 3. Mood: Provide anxiety/mood support. Has a supportive family. On nicotine patch  -changed to lexapro 5mg   and give earlier in evening to reduce am sedation, increase ambien for sleep monitor am sedation 4.  Neuropsych: This patient is capable of making decisions on his own behalf.  5. HTN: will monitor with bid checks. Continue metoprolol, norvasc and catapres. Off lasix due to renal insufficiency/dysphagia diet. Mainly systolic elevation. Clonidine  adjusted. No changes at  present 6. Reactive leucocytosis: Resolving.  7. Dyslipidemia: Continue lipitor.  8. Dysphagia: continue D2, nectar liquids. Continue to educate patient and family on compliance of current restrictions and aspiration risk.   9. Prostate cancer/bladder CA: H/o frequency. Toilet every 2-3 hours to help with continence. Condom cath at night.  10. CKD: Will monitor with routine checks. Currently at baseline.     LOS (Days) 17 A FACE TO FACE EVALUATION WAS PERFORMED  KIRSTEINS,ANDREW E 03/15/2013, 9:53 AM

## 2013-03-15 NOTE — Progress Notes (Signed)
Occupational Therapy Session Note  Patient Details  Name: Kevin Escobar MRN: [id number] Date of Birth: 1941-05-26  Today's Date: 03/15/2013 Time: 1000-1100 Time Calculation (min): 60 min  Short Term Goals: Week 3:  OT Short Term Goal 1 (Week 3): Pt will wash BLE including feet with steady assist OT Short Term Goal 2 (Week 3): Pt will complete LB dressing with mod assist OT Short Term Goal 3 (Week 3): Pt will complete bathing with min assist OT Short Term Goal 4 (Week 3): Pt will complete toilet transfer with min assist  Skilled Therapeutic Interventions/Progress Updates:    Pt seen for ADL retraining with focus on increased participation in LB dressing tasks, transfers, sit <> stand, upright standing posture, and education on hemi-technique with bathing and dressing.  Pt in bed upon arrival, completed squat pivot transfer with mod assist.  Pt attempted to complete transfer without assistance stating that he had done it over the weekend, however he required lifting assist this session.  Pt required multiple rest breaks during session, reporting "I should be more awake" and having slept better last night.  Pt with increased participation with LB dressing this session, with steady assist to lift RLE while he donned Rt pant leg of underwear and pants.  Pt required assistance to pull underwear and pants over Rt hip.  Donned regular underwear this session, as he reports he has not had any accidents recently.  Discussed timed toileting and use of urinal with pt and RN.   Therapy Documentation Precautions:  Precautions Precautions: Fall Precaution Comments: right hemiparesis  Restrictions Weight Bearing Restrictions: No General:   Vital Signs: Therapy Vitals Temp: 97.8 F (36.6 C) Temp src: Oral Pulse Rate: 62 Resp: 20 BP: 125/81 mmHg Patient Position, if appropriate: Lying Oxygen Therapy SpO2: 96 % (took vitals due to patient complaint of trouble breathing.) O2 Device: None (Room  air) (informed RN.) Pain:  Pt with no c/o pain this session.  See FIM for current functional status  Therapy/Group: Individual Therapy  Rosalio Loud 03/15/2013, 12:11 PM

## 2013-03-15 NOTE — Progress Notes (Signed)
Physical Therapy Session Note  Patient Details  Name: Kevin Escobar MRN: [id number] Date of Birth: 02-Jul-1941  Today's Date: 03/15/2013 Time: 5284-1324 Time Calculation (min): 58 min  Short Term Goals: Week 1:  PT Short Term Goal 1 (Week 1): Patient will be able to perform bed mobility with Moderate assist. PT Short Term Goal 1 - Progress (Week 1): Met PT Short Term Goal 2 (Week 1): Patient will be able to perform transfers with Moderate assist. PT Short Term Goal 2 - Progress (Week 1): Progressing toward goal PT Short Term Goal 3 (Week 1): Patient will be able to perform gait using LRAD x 10' with Moderate assist x 2. PT Short Term Goal 3 - Progress (Week 1): Progressing toward goal PT Short Term Goal 4 (Week 1): Patient will be able to perform static standing balance with minimum assist. PT Short Term Goal 4 - Progress (Week 1): Progressing toward goal PT Short Term Goal 5 (Week 1): Patient will be able to perform dynamic standing balance with Moderate assist. PT Short Term Goal 5 - Progress (Week 1): Progressing toward goal Week 2:  PT Short Term Goal 1 (Week 2): Pt will perform bed mobility with min assist and min cuing for technique.  PT Short Term Goal 1 - Progress (Week 2): Met PT Short Term Goal 2 (Week 2): Pt will perform transfers at moderate assist level.  PT Short Term Goal 2 - Progress (Week 2): Met PT Short Term Goal 3 (Week 2): Patient will be able to perform dynamic standing balance with Moderate assist. PT Short Term Goal 3 - Progress (Week 2): Progressing toward goal PT Short Term Goal 4 (Week 2): Patient will be able to perform static standing balance with minimum assist. PT Short Term Goal 4 - Progress (Week 2): Progressing toward goal PT Short Term Goal 5 (Week 2): Patient will be able to perform gait using LRAD x 10' with Moderate assist x 2. PT Short Term Goal 5 - Progress (Week 2): Met Week 3:  PT Short Term Goal 1 (Week 3): Pt will perform bed mobility on flat  bed consistently with min A and verbalizing sequence to wife PT Short Term Goal 2 (Week 3): Pt will performed bed <> w/c transfers to L and R consistently with min A PT Short Term Goal 3 (Week 3): Pt will perform w/c mobility in controlled and home environments with supervision x 150' PT Short Term Goal 4 (Week 3): Pt will perform ambulation with LRAD and appropriate orthoses x 25' and mod A PT Short Term Goal 5 (Week 3): Pt will ascend and descend 4 stairs with one rail, appropriate orthosis and mod A  Skilled Therapeutic Interventions/Progress Updates:   Pt reports sitting in w/c all day and soreness on buttocks.  Performed stair negotiation training for home entry/exit and once inside to access main level.  Performed up/down 3 stairs with L rail forwards to ascend, laterally to descend x 2 reps with mod A with verbal and tactile cues for upright posture, R lateral weight shifting and activation of RLE during stance to advance LLE to next step when ascending or descending and to fully advance and place R foot on next step.  Wife reports that she may be able to have ramp constructed for home entry before D/C.  To practice stair negotiation once inside the house performed up/down 3 stairs x 2 reps with R rail laterally to ascend and descend with LUE support and mod A.  Wife  asking how to ascend one step from porch into house.  Demonstrated to wife how to bump w/c up one step.  Wife gave repeat demonstration x 2 reps.  Pt requesting to return to bed.  Transfer to R stand pivot w/c > bed with mod A and sit > supine min A.     Therapy Documentation Precautions:  Precautions Precautions: Fall Precaution Comments: right hemiparesis  Restrictions Weight Bearing Restrictions: No Vital Signs: Therapy Vitals BP: 107/69 mmHg Pain: Pain Assessment Pain Assessment: 0-10 Pain Score: 1  Pain Type: Acute pain Pain Location: Buttocks Pain Orientation: Left Pain Descriptors / Indicators: Sore Pain Onset:  Other (Comment) (sitting all day) Pain Intervention(s): Repositioned Locomotion : Wheelchair Mobility Distance: 150   See FIM for current functional status  Therapy/Group: Individual Therapy  Edman Circle [facility] 03/15/2013, 4:40 PM

## 2013-03-16 ENCOUNTER — Inpatient Hospital Stay (HOSPITAL_COMMUNITY): Payer: Medicare Other | Admitting: Physical Therapy

## 2013-03-16 ENCOUNTER — Inpatient Hospital Stay (HOSPITAL_COMMUNITY): Payer: Medicare Other | Admitting: Occupational Therapy

## 2013-03-16 ENCOUNTER — Inpatient Hospital Stay (HOSPITAL_COMMUNITY): Payer: Medicare Other | Admitting: Speech Pathology

## 2013-03-16 DIAGNOSIS — I6330 Cerebral infarction due to thrombosis of unspecified cerebral artery: Secondary | ICD-10-CM

## 2013-03-16 DIAGNOSIS — I69991 Dysphagia following unspecified cerebrovascular disease: Secondary | ICD-10-CM

## 2013-03-16 DIAGNOSIS — G8110 Spastic hemiplegia affecting unspecified side: Secondary | ICD-10-CM

## 2013-03-16 MED ORDER — FLAVOXATE HCL 100 MG PO TABS
100.0000 mg | ORAL_TABLET | Freq: Three times a day (TID) | ORAL | Status: DC
  Administered 2013-03-16 – 2013-03-26 (×29): 100 mg via ORAL
  Filled 2013-03-16 (×32): qty 1

## 2013-03-16 MED ORDER — CEPHALEXIN 250 MG PO CAPS
250.0000 mg | ORAL_CAPSULE | Freq: Three times a day (TID) | ORAL | Status: DC
  Administered 2013-03-16 – 2013-03-19 (×8): 250 mg via ORAL
  Filled 2013-03-16 (×11): qty 1

## 2013-03-16 NOTE — Progress Notes (Signed)
Physical Therapy Session Note  Patient Details  Name: Kevin Escobar MRN: [id number] Date of Birth: 06-13-1941  Today's Date: 03/16/2013 Time: 1430-1530 Time Calculation (min): 60 min  Short Term Goals: Week 1:  PT Short Term Goal 1 (Week 1): Patient will be able to perform bed mobility with Moderate assist. PT Short Term Goal 1 - Progress (Week 1): Met PT Short Term Goal 2 (Week 1): Patient will be able to perform transfers with Moderate assist. PT Short Term Goal 2 - Progress (Week 1): Progressing toward goal PT Short Term Goal 3 (Week 1): Patient will be able to perform gait using LRAD x 10' with Moderate assist x 2. PT Short Term Goal 3 - Progress (Week 1): Progressing toward goal PT Short Term Goal 4 (Week 1): Patient will be able to perform static standing balance with minimum assist. PT Short Term Goal 4 - Progress (Week 1): Progressing toward goal PT Short Term Goal 5 (Week 1): Patient will be able to perform dynamic standing balance with Moderate assist. PT Short Term Goal 5 - Progress (Week 1): Progressing toward goal Week 2:  PT Short Term Goal 1 (Week 2): Pt will perform bed mobility with min assist and min cuing for technique.  PT Short Term Goal 1 - Progress (Week 2): Met PT Short Term Goal 2 (Week 2): Pt will perform transfers at moderate assist level.  PT Short Term Goal 2 - Progress (Week 2): Met PT Short Term Goal 3 (Week 2): Patient will be able to perform dynamic standing balance with Moderate assist. PT Short Term Goal 3 - Progress (Week 2): Progressing toward goal PT Short Term Goal 4 (Week 2): Patient will be able to perform static standing balance with minimum assist. PT Short Term Goal 4 - Progress (Week 2): Progressing toward goal PT Short Term Goal 5 (Week 2): Patient will be able to perform gait using LRAD x 10' with Moderate assist x 2. PT Short Term Goal 5 - Progress (Week 2): Met Week 3:  PT Short Term Goal 1 (Week 3): Pt will perform bed mobility on flat  bed consistently with min A and verbalizing sequence to wife PT Short Term Goal 2 (Week 3): Pt will performed bed <> w/c transfers to L and R consistently with min A PT Short Term Goal 3 (Week 3): Pt will perform w/c mobility in controlled and home environments with supervision x 150' PT Short Term Goal 4 (Week 3): Pt will perform ambulation with LRAD and appropriate orthoses x 25' and mod A PT Short Term Goal 5 (Week 3): Pt will ascend and descend 4 stairs with one rail, appropriate orthosis and mod A  Skilled Therapeutic Interventions/Progress Updates:   Performed NMR for RLE with BIONESS electrical stimulation for R quad and ankle DF muscle groups during gait x 25' with wall rail for LUE support; unable to elicit ankle DF in gait mode; returned to sitting and adjusted placement to elicit ankle DF during gait.  Unable to attempt gait again secondary to pt reporting strong urge to urinate.  Returned to room and assisted pt with standing, removal of pants and use of urinal.  Pt with increased difficulty urinating secondary to burning pain.  RN notified.  After urination pt transferred stand pivot to bed with mod-max A and sit > supine on flat bed with min A.   Therapy Documentation Precautions:  Precautions Precautions: Fall Precaution Comments: right hemiparesis  Restrictions Weight Bearing Restrictions: No Vital Signs: Therapy Vitals  Temp: 98.5 F (36.9 C) Temp src: Oral Pulse Rate: 55 Resp: 17 BP: 129/81 mmHg Patient Position, if appropriate: Lying Oxygen Therapy SpO2: 97 % O2 Device: None (Room air) Pain: Pain Assessment Pain Assessment: No/denies pain Locomotion : Ambulation Ambulation/Gait Assistance: 2: Max assist  Other Treatments: Treatments Neuromuscular Facilitation: Right;Lower Extremity;Activity to increase motor control;Activity to increase timing and sequencing;Activity to increase sustained activation;Activity to increase lateral weight shifting;Activity to increase  anterior-posterior weight shifting Modalities Modalities: Copywriter, advertising Location: R quad and R anterior tibialis muscle groups Electrical Stimulation Action: Knee extension in stance phase of gait and ankle DF during swing and initial stance to advance COG anterior over R stance phase Electrical Stimulation Parameters: Please see BIONESS unit Electrical Stimulation Goals: Neuromuscular facilitation  See FIM for current functional status  Therapy/Group: Individual Therapy  Edman Circle [facility] 03/16/2013, 5:17 PM

## 2013-03-16 NOTE — Progress Notes (Signed)
Speech Language Pathology Daily Session Note  Patient Details  Name: Kevin Escobar MRN: [id number] Date of Birth: Nov 21, 1940  Today's Date: 03/16/2013 Time: 1317-1400 Time Calculation (min): 43 min  Short Term Goals: Week 3: SLP Short Term Goal 1 (Week 3): The patient will perform oral motor, pharyngeal strengthening and diaphragmatic breathing exercises with Supervision cuing.  SLP Short Term Goal 2 (Week 3): Patient will utilize compensatory strategies to increase speech intelligibility at the phrase level with Min verbal cues.   SLP Short Term Goal 3 (Week 3): Patient will consume Dys.3 textures and thin liquids with minimal s/s of aspiration.  SLP Short Term Goal 4 (Week 3): Patient will self-monitor and correct wet vocal quality with Min verbal cues.  Skilled Therapeutic Interventions: Skilled treatment session focused on addressing dysarthria goals.  SLP facilitated session with Mod verbal cues to utilize increased vocal intensity at the phrase level during a structured task.  Specifically, patient demonstrated difficulty coordinating breathing with onset of speech at the beginning of exhalation; he required Max cues to self-monitor and correct this with breath support increasing from 1 word per breath to 2-3 words per breath.  SLP re-educated patient regarding need to adjust vocal intensity as environmental noise varies in a home environment.     FIM:  Comprehension Comprehension Mode: Auditory Comprehension: 5-Follows basic conversation/direction: With extra time/assistive device Expression Expression Mode: Verbal Expression: 3-Expresses basic 50 - 74% of the time/requires cueing 25 - 50% of the time. Needs to repeat parts of sentences. Social Interaction Social Interaction: 4-Interacts appropriately 75 - 89% of the time - Needs redirection for appropriate language or to initiate interaction. Problem Solving Problem Solving: 5-Solves basic 90% of the time/requires cueing < 10%  of the time Memory Memory: 5-Recognizes or recalls 90% of the time/requires cueing < 10% of the time FIM - Eating Eating Activity: 5: Supervision/cues;5: Set-up assist for open containers;5: Set-up assist for cut food  Pain Pain Assessment Pain Assessment: No/denies pain  Therapy/Group: Individual Therapy  Charlane Ferretti., CCC-SLP [phone]  Kevin Escobar 03/16/2013, 4:18 PM

## 2013-03-16 NOTE — Progress Notes (Signed)
Occupational Therapy Note  Patient Details  Name: Kevin Escobar MRN: [id number] Date of Birth: 03/04/1941 Today's Date: 03/16/2013  [employer] with SLP 1130-12 No c/o pain Self feeding group with focus on safe swallowing techniques, awareness of positioning of right UE. Pt even was able to grasp a built up handle with right hand, able to initiate using right elbow to bring food (on fork) to his mouth with extra time and support against gravity.    Roney Mans [facility] 03/16/2013, 2:25 PM

## 2013-03-16 NOTE — Progress Notes (Signed)
Patient ID: Kevin Escobar, male   DOB: 1940/11/05, 72 y.o.   MRN: [id number] Subjective/Complaints: Slept better with Ambien 10 mg, no pain c/os, still weak on Right with spasticity. Patient doesn't feel speech therapy is worthwhile but thinks physical therapy is worthwhile Still difficult to understand secondary to dysarthria however. I mentioned that  to him. ROS  A 12 point review of systems has been performed and if not noted above is otherwise negative.   Objective: Vital Signs: Blood pressure 132/73, pulse 67, temperature 97.5 F (36.4 C), temperature source Oral, resp. rate 19, weight 67.405 kg (148 lb 9.6 oz), SpO2 97.00%.     No results found for this or any previous visit (from the past 72 hour(s)).   HEENT: Eyes not injected, oral mucosa moist, external ears normal Cardio: RRR and No murmur Resp: CTA B/L and unlabored GI: BS positive and Nontender Extremity:  Pulses positive and No Edema Skin:   Intact, right heel boggy Neuro: More Alert/Oriented, Flat, Cranial Nerve Abnormalities Right central 7, Normal Sensory, Abnormal Motor tr to 1/5 in the right upper except 1/5 R finger flexors.(trace tone at wrist,fingers) RLE tr in HF, KE. 1/5 distally. Equinovarus position of right foot. Tone:    Dysarthria continues Musc/Skel:  Normal General no acute distress   Assessment/Plan: 1. 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 Physiatrist is providing close team supervision and 24 hour management of active medical problems listed below. Physiatrist and rehab team continue to assess barriers to discharge/monitor patient progress toward functional and medical goals.  FIM: FIM - Bathing Bathing Steps Patient Completed: Chest;Right Arm;Abdomen;Front perineal area;Buttocks;Right upper leg;Left upper leg Bathing: 3: Mod-Patient completes 5-7 46f 10 parts or  50-74%  FIM - Upper Body Dressing/Undressing Upper body dressing/undressing steps patient completed: Thread/unthread left sleeve of pullover shirt/dress;Put head through opening of pull over shirt/dress;Pull shirt over trunk Upper body dressing/undressing: 4: Min-Patient completed 75 plus % of tasks FIM - Lower Body Dressing/Undressing Lower body dressing/undressing steps patient completed: Thread/unthread right underwear leg;Thread/unthread left underwear leg;Thread/unthread right pants leg;Thread/unthread left pants leg;Don/Doff left shoe;Fasten/unfasten left shoe Lower body dressing/undressing: 3: Mod-Patient completed 50-74% of tasks  FIM - Toileting Toileting: 0: Activity did not occur  FIM - Diplomatic Services operational officer Devices: Psychiatrist Transfers: 0-Activity did not occur  FIM - Banker Devices: Arm rests;Orthosis Bed/Chair Transfer: 4: Sit > Supine: Min A (steadying pt. > 75%/lift 1 leg);3: Chair or W/C > Bed: Mod A (lift or lower assist);3: Bed > Chair or W/C: Mod A (lift or lower assist)  FIM - Locomotion: Wheelchair Distance: 150 Locomotion: Wheelchair: 5: Travels 150 ft or more: maneuvers on rugs and over door sills with supervision, cueing or coaxing FIM - Locomotion: Ambulation Locomotion: Ambulation Assistive Devices: Other (comment) (side rails on the hall , AFO) Ambulation/Gait Assistance: 2: Max assist Locomotion: Ambulation: 0: Activity did not occur  Comprehension Comprehension Mode: Auditory Comprehension: 5-Follows basic conversation/direction: With extra time/assistive device  Expression Expression Mode: Verbal Expression: 3-Expresses basic 50 - 74% of the time/requires cueing 25 - 50% of the time. Needs to repeat parts of sentences.  Social Interaction Social Interaction Mode: Asleep Social Interaction: 5-Interacts appropriately 90% of the time - Needs monitoring or encouragement for  participation or interaction.  Problem Solving Problem Solving Mode: Asleep Problem Solving: 4-Solves basic 75 - 89% of the time/requires cueing 10 -  24% of the time  Memory Memory Mode: Asleep Memory: 4-Recognizes or recalls 75 - 89% of the time/requires cueing 10 - 24% of the time   Medical Problem List and Plan:  1. DVT Prophylaxis/Anticoagulation: Pharmaceutical: Lovenox  2. Pain Management: will add scheduled hs tramadol for back pain. 3. Mood: Provide anxiety/mood support. Has a supportive family. On nicotine patch  -changed to lexapro 5mg  and give earlier in evening to reduce am sedation, increase ambien for sleep monitor am sedation 4. Neuropsych: This patient is capable of making decisions on his own behalf.  5. HTN: will monitor with bid checks. Continue metoprolol, norvasc and catapres. Off lasix due to renal insufficiency/dysphagia diet. Mainly systolic elevation. Clonidine  adjusted. No changes at  present 6. Reactive leucocytosis: Resolving.  7. Dyslipidemia: Continue lipitor.  8. Dysphagia: continue D2, nectar liquids. Continue to educate patient and family on compliance of current restrictions and aspiration risk.   9. Prostate cancer/bladder CA: H/o frequency. Toilet every 2-3 hours to help with continence. Condom cath at night.  10. CKD: Will monitor with routine checks. Currently at baseline.     LOS (Days) 18 A FACE TO FACE EVALUATION WAS PERFORMED  KIRSTEINS,ANDREW E 03/16/2013, 9:01 AM

## 2013-03-16 NOTE — Progress Notes (Signed)
Speech Language Pathology Daily Session Note  Patient Details  Name: Kevin Escobar MRN: [id number] Date of Birth: [birth year]  Today's Date: 03/16/2013 Time: 1200-1210 Time Calculation (min): 10 min  Short Term Goals: Week 3: SLP Short Term Goal 1 (Week 3): The patient will perform oral motor, pharyngeal strengthening and diaphragmatic breathing exercises with Supervision cuing.  SLP Short Term Goal 2 (Week 3): Patient will utilize compensatory strategies to increase speech intelligibility at the phrase level with Min verbal cues.   SLP Short Term Goal 3 (Week 3): Patient will consume Dys.3 textures and thin liquids with minimal s/s of aspiration.  SLP Short Term Goal 4 (Week 3): Patient will self-monitor and correct wet vocal quality with Min verbal cues.  Skilled Therapeutic Interventions: Skilled group co- treatment session with OT focused on addressing self-care goals and SLP facilitated session by addressing dysphagia goals. SLP facilitated session with lunch of Dys.3 textures and thin liquids. Patient required Min-Mod verbal cues to utilize safe swallow strategies, which were effective at preventing s/s of aspiration.  Continue with current plan of care.   FIM:  Comprehension Comprehension Mode: Auditory Comprehension: 5-Follows basic conversation/direction: With extra time/assistive device Expression Expression Mode: Verbal Expression: 3-Expresses basic 50 - 74% of the time/requires cueing 25 - 50% of the time. Needs to repeat parts of sentences. Social Interaction Social Interaction: 4-Interacts appropriately 75 - 89% of the time - Needs redirection for appropriate language or to initiate interaction. Problem Solving Problem Solving: 5-Solves basic 90% of the time/requires cueing < 10% of the time Memory Memory: 5-Recognizes or recalls 90% of the time/requires cueing < 10% of the time FIM - Eating Eating Activity: 5: Supervision/cues;5: Set-up assist for open containers;5:  Set-up assist for cut food  Pain Pain Assessment Pain Assessment: No/denies pain  Therapy/Group: Group Therapy  Charlane Ferretti., CCC-SLP [phone]  Kevin Escobar 03/16/2013, 4:14 PM

## 2013-03-16 NOTE — Progress Notes (Signed)
Occupational Therapy Session Note  Patient Details  Name: Kevin Escobar MRN: [id number] Date of Birth: 1941-07-08  Today's Date: 03/16/2013 Time: 1032-1130 Time Calculation (min): 58 min  Short Term Goals: Week 3:  OT Short Term Goal 1 (Week 3): Pt will wash BLE including feet with steady assist OT Short Term Goal 2 (Week 3): Pt will complete LB dressing with mod assist OT Short Term Goal 3 (Week 3): Pt will complete bathing with min assist OT Short Term Goal 4 (Week 3): Pt will complete toilet transfer with min assist  Skilled Therapeutic Interventions/Progress Updates:    Pt seen for ADL retraining with focus on increased participation in LB dressing tasks, transfers, sit <> stand, dynamic standing, and carryover of hemi-dressing technique.  Pt completed squat pivot from bed > w/c with min assist this session with improved weight shifting and body positioning.  Pt with increased participation with LB dressing this session, with steady assist to lift RLE while he donned Rt pant leg of underwear and pants. Pt required assistance to pull pants over Rt hip, but was successful with pulling up underwear. Pt reports no incontinent episodes while wearing regular underwear yesterday.  Pt with Rt first finger extension during session and moderate grasp in Rt hand.  Engaged in PROM at shoulder, elbow, and wrist with pt with no active movement in these areas.  Discussed increased functional use of Rt hand as pt with active grasp, encouraged pt to use it to stabilize larger items with grooming and self-feeding tasks.  Pt's brother and sister-in-law present at end of session and very encouraging of pt's progress.  Therapy Documentation Precautions:  Precautions Precautions: Fall Precaution Comments: right hemiparesis  Restrictions Weight Bearing Restrictions: No General:   Vital Signs: Therapy Vitals Pulse Rate: 67 BP: 132/73 mmHg Pain:  Pt with no c/o pain this session.  See FIM for current  functional status  Therapy/Group: Individual Therapy  Rosalio Loud 03/16/2013, 11:35 AM

## 2013-03-17 ENCOUNTER — Inpatient Hospital Stay (HOSPITAL_COMMUNITY): Payer: Medicare Other | Admitting: Occupational Therapy

## 2013-03-17 ENCOUNTER — Inpatient Hospital Stay (HOSPITAL_COMMUNITY): Payer: Medicare Other | Admitting: Physical Therapy

## 2013-03-17 ENCOUNTER — Inpatient Hospital Stay (HOSPITAL_COMMUNITY): Payer: Medicare Other | Admitting: Speech Pathology

## 2013-03-17 NOTE — Progress Notes (Signed)
Occupational Therapy Note  Patient Details  Name: Kevin Escobar MRN: [id number] Date of Birth: [birth year] Today's Date: 03/17/2013  [employer] with SLP  1130-12 No c/o pain  Self feeding group with focus on utilizing safe swallowing techniques, functional use of right UE including self feeding with mod A for support and built up handle and to use as a gross A to grasp the ice cream cup to eat out. Pt very motivated with all trials of self feeding with right UE.   Roney Mans [facility] 03/17/2013, 2:58 PM

## 2013-03-17 NOTE — Progress Notes (Signed)
Occupational Therapy Session Note  Patient Details  Name: Kevin Escobar MRN: [id number] Date of Birth: Jan 15, 1941  Today's Date: 03/17/2013 Time: 0930-1015 Time Calculation (min): 45 min  Short Term Goals: Week 3:  OT Short Term Goal 1 (Week 3): Pt will wash BLE including feet with steady assist OT Short Term Goal 2 (Week 3): Pt will complete LB dressing with mod assist OT Short Term Goal 3 (Week 3): Pt will complete bathing with min assist OT Short Term Goal 4 (Week 3): Pt will complete toilet transfer with min assist  Skilled Therapeutic Interventions/Progress Updates:    Pt seen for ADL retraining with focus on increased participation in LB dressing tasks, transfers, sit <> stand, dynamic standing, and carryover of hemi-dressing technique. Pt completed squat pivot from bed > w/c to Lt with min assist this session with improved weight shifting and body positioning. Pt with increased participation with LB dressing this session, with steady assist to lift RLE while he donned Rt pant leg of underwear and pants. Pt required assistance to pull pants over Rt hip, but was successful with pulling up underwear. Sit <> stand x3 with min assist this session and blocking at Rt knee for upright standing posture.  Squat pivot w/c to bed to Rt with max assist.  Pt with increased fatigue this session, requesting to return to bed at end of session and requesting to terminate session early.  Therapy Documentation Precautions:  Precautions Precautions: Fall Precaution Comments: right hemiparesis  Restrictions Weight Bearing Restrictions: No General: General Amount of Missed OT Time (min): 15 Minutes Pain:  Pt with no c/o pain this session.  See FIM for current functional status  Therapy/Group: Individual Therapy  Rosalio Loud 03/17/2013, 10:38 AM

## 2013-03-17 NOTE — Progress Notes (Signed)
Patient ID: Kevin Escobar, male   DOB: October 14, 1940, 72 y.o.   MRN: [id number] Subjective/Complaints: Burning with urination. Started on Keflex. Urine culture pending. No new complaints other than the above. ROS  A 12 point review of systems has been performed and if not noted above is otherwise negative.   Objective: Vital Signs: Blood pressure 125/68, pulse 64, temperature 98 F (36.7 C), temperature source Oral, resp. rate 18, weight 67.405 kg (148 lb 9.6 oz), SpO2 95.00%.     No results found for this or any previous visit (from the past 72 hour(s)).   HEENT: Eyes not injected, oral mucosa moist, external ears normal Cardio: RRR and No murmur Resp: CTA B/L and unlabored GI: BS positive and Nontender Extremity:  Pulses positive and No Edema Skin:   Intact, right heel boggy Neuro: More Alert/Oriented, Flat, Cranial Nerve Abnormalities Right central 7, Normal Sensory, Abnormal Motor tr to 1/5 in the right upper except 1/5 R finger flexors.(trace tone at wrist,fingers) RLE tr in HF, KE. 1/5 distally. Equinovarus position of right foot. Tone:    Dysarthria continues Musc/Skel:  Normal General no acute distress   Assessment/Plan: 1. 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 Physiatrist is providing close team supervision and 24 hour management of active medical problems listed below. Physiatrist and rehab team continue to assess barriers to discharge/monitor patient progress toward functional and medical goals.  FIM: FIM - Bathing Bathing Steps Patient Completed: Chest;Right Arm;Abdomen;Front perineal area;Buttocks;Right upper leg;Left upper leg Bathing: 3: Mod-Patient completes 5-7 66f 10 parts or 50-74%  FIM - Upper Body Dressing/Undressing Upper body dressing/undressing steps patient completed: Thread/unthread left sleeve of pullover shirt/dress;Put  head through opening of pull over shirt/dress;Pull shirt over trunk Upper body dressing/undressing: 4: Min-Patient completed 75 plus % of tasks FIM - Lower Body Dressing/Undressing Lower body dressing/undressing steps patient completed: Thread/unthread right underwear leg;Thread/unthread left underwear leg;Pull underwear up/down Lower body dressing/undressing: 3: Mod-Patient completed 50-74% of tasks  FIM - Toileting Toileting: 0: Activity did not occur  FIM - Diplomatic Services operational officer Devices: Psychiatrist Transfers: 0-Activity did not occur  FIM - Banker Devices: Arm rests Bed/Chair Transfer: 4: Sit > Supine: Min A (steadying pt. > 75%/lift 1 leg);3: Bed > Chair or W/C: Mod A (lift or lower assist);3: Chair or W/C > Bed: Mod A (lift or lower assist);4: Supine > Sit: Min A (steadying Pt. > 75%/lift 1 leg)  FIM - Locomotion: Wheelchair Distance: 150 Locomotion: Wheelchair: 1: Total Assistance/staff pushes wheelchair (Pt<25%) FIM - Locomotion: Ambulation Locomotion: Ambulation Assistive Devices: Other (comment) (Bioness and wall rail) Ambulation/Gait Assistance: 2: Max assist Locomotion: Ambulation: 1: Travels less than 50 ft with maximal assistance (Pt: 25 - 49%)  Comprehension Comprehension Mode: Auditory Comprehension: 5-Follows basic conversation/direction: With extra time/assistive device  Expression Expression Mode: Verbal Expression: 5-Expresses basic needs/ideas: With no assist  Social Interaction Social Interaction Mode: Asleep Social Interaction: 4-Interacts appropriately 75 - 89% of the time - Needs redirection for appropriate language or to initiate interaction.  Problem Solving Problem Solving Mode: Asleep Problem Solving: 5-Solves basic 90% of the time/requires cueing < 10% of the time  Memory Memory Mode: Asleep Memory: 5-Recognizes or recalls 90% of the time/requires cueing < 10% of the  time   Medical Problem List and Plan:  1. DVT Prophylaxis/Anticoagulation: Pharmaceutical: Lovenox  2. Pain Management: will add scheduled hs  tramadol for back pain. 3. Mood: Provide anxiety/mood support. Has a supportive family. On nicotine patch  -changed to lexapro 5mg  and give earlier in evening to reduce am sedation, increase ambien for sleep monitor am sedation 4. Neuropsych: This patient is capable of making decisions on his own behalf.  5. HTN: will monitor with bid checks. Continue metoprolol, norvasc and catapres. Off lasix due to renal insufficiency/dysphagia diet. Mainly systolic elevation. Clonidine  adjusted. No changes at  present 6. Reactive leucocytosis: Resolving.  7. Dyslipidemia: Continue lipitor.  8. Dysphagia: continue D2, nectar liquids. Continue to educate patient and family on compliance of current restrictions and aspiration risk.   9. Prostate cancer/bladder CA: H/o frequency. Toilet every 2-3 hours to help with continence. Condom cath at night.  10. CKD: Will monitor with routine checks. Currently at baseline.  11.  UTI keflex pending culture   LOS (Days) 19 A FACE TO FACE EVALUATION WAS PERFORMED  KIRSTEINS,ANDREW E 03/17/2013, 1:00 PM

## 2013-03-17 NOTE — Progress Notes (Signed)
Physical Therapy Session Note  Patient Details  Name: Kevin Escobar MRN: [id number] Date of Birth: 04-10-1941  Today's Date: 03/17/2013 Time: 7829-5621 Time Calculation (min): 59 min  Short Term Goals: Week 3:  PT Short Term Goal 1 (Week 3): Pt will perform bed mobility on flat bed consistently with min A and verbalizing sequence to wife PT Short Term Goal 2 (Week 3): Pt will performed bed <> w/c transfers to L and R consistently with min A PT Short Term Goal 3 (Week 3): Pt will perform w/c mobility in controlled and home environments with supervision x 150' PT Short Term Goal 4 (Week 3): Pt will perform ambulation with LRAD and appropriate orthoses x 25' and mod A PT Short Term Goal 5 (Week 3): Pt will ascend and descend 4 stairs with one rail, appropriate orthosis and mod A  Skilled Therapeutic Interventions/Progress Updates:    pt propelled W/C with L UE and LE with MinA 160'.  Occasional MinA for objects on L side only.  Practiced transfers from W/C to multiple different surfaces with ModA and cueing for LE extension as pt tends to remain crouched.  Seated balance with reaching activity for L UE working on balance and trunk control/activation.  Pt propelled W/C back to room with L UE and LE with MinA.    Therapy Documentation Precautions:  Precautions Precautions: Fall Precaution Comments: right hemiparesis  Restrictions Weight Bearing Restrictions: No  See FIM for current functional status  Therapy/Group: Individual Therapy  Kevin Escobar, Kevin Escobar 03/17/2013, 3:07 PM

## 2013-03-17 NOTE — Progress Notes (Addendum)
Speech Language Pathology Daily Session Note  Patient Details  Name: Kevin Escobar MRN: [id number] Date of Birth: [birth year]  Today's Date: 03/17/2013 Time: 1200-1220 Time Calculation (min): 20 min  Short Term Goals: Week 3: SLP Short Term Goal 1 (Week 3): The patient will perform oral motor, pharyngeal strengthening and diaphragmatic breathing exercises with Supervision cuing.  SLP Short Term Goal 2 (Week 3): Patient will utilize compensatory strategies to increase speech intelligibility at the phrase level with Min verbal cues.   SLP Short Term Goal 3 (Week 3): Patient will consume Dys.3 textures and thin liquids with minimal s/s of aspiration.  SLP Short Term Goal 4 (Week 3): Patient will self-monitor and correct wet vocal quality with Min verbal cues.  Skilled Therapeutic Interventions: Skilled group co- treatment session with OT focus on addressing self-care goals and SLP facilitated session by addressing dysphagia goals. SLP facilitated session with lunch of Dys.3 textures and thin liquids; as well as upgrade of regular textures.  Patient required increased wait time for mastication of regular textures and Supervision level verbal cues to utilize safe swallow strategies: small single cups sips and intermittent hard cough.  Continue with current plan of care.   FIM:  Comprehension Comprehension Mode: Auditory Comprehension: 5-Follows basic conversation/direction: With extra time/assistive device Expression Expression Mode: Verbal Expression: 3-Expresses basic 50 - 74% of the time/requires cueing 25 - 50% of the time. Needs to repeat parts of sentences. Social Interaction Social Interaction: 4-Interacts appropriately 75 - 89% of the time - Needs redirection for appropriate language or to initiate interaction. Problem Solving Problem Solving: 5-Solves basic 90% of the time/requires cueing < 10% of the time Memory Memory: 5-Recognizes or recalls 90% of the time/requires cueing < 10%  of the time FIM - Eating Eating Activity: 5: Supervision/cues;5: Set-up assist for open containers;5: Set-up assist for cut food  Pain Pain Assessment Pain Assessment: No/denies pain  Therapy/Group: Group Therapy  Charlane Ferretti., CCC-SLP [phone]  Lavoy Bernards 03/17/2013, 1:22 PM

## 2013-03-17 NOTE — Patient Care Conference (Addendum)
Inpatient RehabilitationTeam Conference and Plan of Care Update Date: 03/17/2013   Time: 11:30 AM    Patient Name: Kevin Escobar      Medical Record Number: [id number]  Date of Birth: 10-07-1940 Sex: Male         Room/Bed: 4W07C/4W07C-01 Payor Info: Payor: MEDICARE / Plan: MEDICARE PART A AND B / Product Type: *No Product type* /    Admitting Diagnosis: L CVA  Admit Date/Time:  02/26/2013  4:57 PM Admission Comments: No comment available   Primary Diagnosis:  CVA (cerebral vascular accident) Principal Problem: CVA (cerebral vascular accident)  Patient Active Problem List   Diagnosis Date Noted  . Other emphysema 03/03/2013  . Hypokalemia 02/22/2013  . Depression 02/21/2013  . PVD (peripheral vascular disease) 02/21/2013  . CVA (cerebral vascular accident) 02/20/2013  . Chronic kidney disease   . Hypertension   . TIA (transient ischemic attack)   . Peripheral vascular disease     Expected Discharge Date: Expected Discharge Date: 03/26/13  Team Members Present: Physician leading conference: Dr. Claudette Laws Social Worker Present: Amada Jupiter, LCSW;Jenny Sheika Coutts, LCSW Nurse Present: Other (comment) Janeece Agee, RN) PT Present: Edman Circle, PT;Bridgett Ripa, Lillie Columbia, PT OT Present: Rosalio Loud, OT;James Doran Durand, OT SLP Present: Fae Pippin, Charlie Pitter, SLP     Current Status/Progress Goal Weekly Team Focus  Medical   Urinary tract infection, poor sleep  Improve sleep, treat UTI  Start medications   Bowel/Bladder   Continent of bowel,  occasional incontinence of bladder, condom cath at [facility]  continent of bowel, infrequent incontinence of bladder  continue timed toileting   Swallow/Nutrition/ Hydration   Dys.3 textures and thin liquids with full supervision   least restrictive p.o. intake  increase crryover and use of compensatory strategies and trials of regular textures   ADL's   mod assist bathing, min assist UB dressing, mod  assist LB dressing, min-mod assist transfers, min-mod assist sit <> stand.  Rt grasp developing and first finger extension  min assist overall  transfers, sit <> stand, standing tolerance, dynamic sitting and standing balance, RUE NM re-ed   Mobility   Min A bed mobility, mod-max A basic transfers and gait with blue rocker and GiveMohr sling, supervision w/c mobility  Min A overall  R activation, gait, transfers, stairs and education with wife   Communication   Mod assist  Min assist  increase breath support and coordination of speech during onset exhulation   Safety/Cognition/ Behavioral Observations  WFL with basic         Pain   Pain in Right wrist, right ankl;e and foot. voltarin gel scheduled  pain < 3/10  Voltarin as scheduled, prn pain med as needed   Skin   Skin CDI  No skin breakdown  Turn q2h, mopnitor skin q shift    Rehab Goals Patient on target to meet rehab goals: Yes *See Care Plan and progress notes for long and short-term goals.  Barriers to Discharge: Neurologic deficits, mood    Possible Resolutions to Barriers:  Continued therapy program, neuropsych    Discharge Planning/Teaching Needs:  Pt/family are still planning for pt to go home at d/c.  Pt's wife is very willing to learn how to provide pt with the support he will need at home.  Pt's wife needs to be involved hands-on with therapies.   Team Discussion:  Pt is having some dysuria and MD is addressing this.  Pt has progressed to D3 diet with thin liquids.  ST is still working on decreased breath support.  Pt is making slow progress with therapies.  With OT, pt was able to grasp some and brought his right hand to his mouth.  With PT, pt is working on stairs.  Therapists want to do more family education with pt's wife.  There was discussion about pt being more W/C level.  Revisions to Treatment Plan:  Pt is making slow progress so goals may need to be re-evaluated.   Continued Need for Acute Rehabilitation Level  of Care: The patient requires daily medical management by a physician with specialized training in physical medicine and rehabilitation for the following conditions: Daily direction of a multidisciplinary physical rehabilitation program to ensure safe treatment while eliciting the highest outcome that is of practical value to the patient.: Yes Daily medical management of patient stability for increased activity during participation in an intensive rehabilitation regime.: Yes Daily analysis of laboratory values and/or radiology reports with any subsequent need for medication adjustment of medical intervention for : Neurological problems;Other  Tuvia Woodrick, [place] Deck 03/18/2013, 11:49 AM

## 2013-03-17 NOTE — Progress Notes (Signed)
Speech Language Pathology Daily Session Note  Patient Details  Name: Kevin Escobar MRN: [id number] Date of Birth: 07-Sep-1940  Today's Date: 03/17/2013 Time: 9562-1308 Time Calculation (min): 40 min  Short Term Goals: Week 3: SLP Short Term Goal 1 (Week 3): The patient will perform oral motor, pharyngeal strengthening and diaphragmatic breathing exercises with Supervision cuing.  SLP Short Term Goal 2 (Week 3): Patient will utilize compensatory strategies to increase speech intelligibility at the phrase level with Min verbal cues.   SLP Short Term Goal 3 (Week 3): Patient will consume Dys.3 textures and thin liquids with minimal s/s of aspiration.  SLP Short Term Goal 4 (Week 3): Patient will self-monitor and correct wet vocal quality with Min verbal cues.  Skilled Therapeutic Interventions: Skilled treatment session focused on addressing dysarthria and dysphagia goals.  SLP facilitated session with upgraded trials of regular textures (cracker and peanut butter) with thin liquids via cup.  Patient demonstrated slightly prolonged mastication and no overt s/s of aspiration.  SLP also facilitated session with education using incentive spirometer with Max faded to Mod verbal and visual cues.    FIM:  Comprehension Comprehension Mode: Auditory Comprehension: 5-Follows basic conversation/direction: With extra time/assistive device Expression Expression Mode: Verbal Expression: 3-Expresses basic 50 - 74% of the time/requires cueing 25 - 50% of the time. Needs to repeat parts of sentences. Social Interaction Social Interaction: 4-Interacts appropriately 75 - 89% of the time - Needs redirection for appropriate language or to initiate interaction. Problem Solving Problem Solving: 5-Solves basic 90% of the time/requires cueing < 10% of the time Memory Memory: 5-Recognizes or recalls 90% of the time/requires cueing < 10% of the time FIM - Eating Eating Activity: 5: Supervision/cues;5: Set-up  assist for open containers;5: Set-up assist for cut food  Pain Pain Assessment Pain Assessment: No/denies pain  Therapy/Group: Individual Therapy  Charlane Ferretti., CCC-SLP [phone]  Johnattan Strassman 03/17/2013, 4:31 PM

## 2013-03-18 ENCOUNTER — Inpatient Hospital Stay (HOSPITAL_COMMUNITY): Payer: Medicare Other | Admitting: Occupational Therapy

## 2013-03-18 ENCOUNTER — Inpatient Hospital Stay (HOSPITAL_COMMUNITY): Payer: Medicare Other | Admitting: Speech Pathology

## 2013-03-18 ENCOUNTER — Encounter (HOSPITAL_COMMUNITY): Payer: Medicare Other

## 2013-03-18 ENCOUNTER — Inpatient Hospital Stay (HOSPITAL_COMMUNITY): Payer: Medicare Other

## 2013-03-18 DIAGNOSIS — I10 Essential (primary) hypertension: Secondary | ICD-10-CM

## 2013-03-18 LAB — URINE CULTURE: Colony Count: >=100000

## 2013-03-18 MED ORDER — METOPROLOL TARTRATE 12.5 MG HALF TABLET
12.5000 mg | ORAL_TABLET | Freq: Two times a day (BID) | ORAL | Status: DC
  Administered 2013-03-18 – 2013-03-26 (×17): 12.5 mg via ORAL
  Filled 2013-03-18 (×20): qty 1

## 2013-03-18 NOTE — Progress Notes (Signed)
Speech Language Pathology Daily Session Note  Patient Details  Name: Kevin Escobar MRN: [id number] Date of Birth: [birth year]  Today's Date: 03/18/2013 Time: 4098-1191 Time Calculation (min): 23 min  Short Term Goals: Week 3: SLP Short Term Goal 1 (Week 3): The patient will perform oral motor, pharyngeal strengthening and diaphragmatic breathing exercises with Supervision cuing.  SLP Short Term Goal 2 (Week 3): Patient will utilize compensatory strategies to increase speech intelligibility at the phrase level with Min verbal cues.   SLP Short Term Goal 3 (Week 3): Patient will consume Dys.3 textures and thin liquids with minimal s/s of aspiration.  SLP Short Term Goal 4 (Week 3): Patient will self-monitor and correct wet vocal quality with Min verbal cues.  Skilled Therapeutic Interventions: Skilled group treatment session with focus on addressing dysphagia goals. SLP facilitated session with lunch of upgrade regular textures and thin liquids. Patient required increased wait time and Min verbal cues to request help as needed.  SLP also facilitated session with Supervision level verbal cues to utilize safe swallow strategies: check for pocketing, small single cups sips and intermittent hard cough. Continue with current plan of care.    FIM:  Comprehension Comprehension Mode: Auditory Comprehension: 5-Follows basic conversation/direction: With extra time/assistive device Expression Expression Mode: Verbal Expression: 3-Expresses basic 50 - 74% of the time/requires cueing 25 - 50% of the time. Needs to repeat parts of sentences. Social Interaction Social Interaction: 4-Interacts appropriately 75 - 89% of the time - Needs redirection for appropriate language or to initiate interaction. Problem Solving Problem Solving: 5-Solves basic 90% of the time/requires cueing < 10% of the time Memory Memory: 5-Recognizes or recalls 90% of the time/requires cueing < 10% of the time FIM -  Eating Eating Activity: 5: Supervision/cues;5: Set-up assist for open containers;5: Set-up assist for cut food  Pain Pain Assessment Pain Assessment: No/denies pain  Therapy/Group: Group Therapy  Charlane Ferretti., CCC-SLP [phone]  Malene Blaydes 03/18/2013, 12:32 PM

## 2013-03-18 NOTE — Progress Notes (Signed)
Occupational Therapy Weekly Progress Note  Patient Details  Name: Kevin Escobar MRN: [id number] Date of Birth: 1940/09/03  Today's Date: 03/18/2013 Time: 0930-1025 Time Calculation (min): 55 min  Patient has met 1 of 4 short term goals.  Pt continues to make steady progress towards long term goals, however continues to require mod assist with LB bathing and dressing secondary to decreased ability to reach feet due to decreased trunk control.  Pt tolerating full therapy schedule with rest breaks between sessions due to anxiety.  Pt requires min-mod assist transfer to Lt, however mod-max assist when transferring to Rt, consistent mod assist with toilet and tub bench transfers, and min-mod assist sit > stand.  Pt continues to require blocking at Rt knee to increase upright standing during self-care tasks.  Patient continues to demonstrate the following deficits: dominant Rt hemiplegia, impaired sequencing, impaired postural control, static and dynamic sitting balance, static standing balance and therefore will continue to benefit from skilled OT intervention to enhance overall performance with BADL and Reduce care partner burden.  Patient progressing toward long term goals..  Continue plan of care.  OT Short Term Goals Week 3:  OT Short Term Goal 1 (Week 3): Pt will wash BLE including feet with steady assist OT Short Term Goal 1 - Progress (Week 3): Not progressing OT Short Term Goal 2 (Week 3): Pt will complete LB dressing with mod assist OT Short Term Goal 2 - Progress (Week 3): Met OT Short Term Goal 3 (Week 3): Pt will complete bathing with min assist OT Short Term Goal 3 - Progress (Week 3): Progressing toward goal OT Short Term Goal 4 (Week 3): Pt will complete toilet transfer with min assist OT Short Term Goal 4 - Progress (Week 3): Progressing toward goal Week 4:  OT Short Term Goal 1 (Week 4): Pt will wash BLE including feet with steady assist OT Short Term Goal 2 (Week 4): Pt will  complete bathing with min assist OT Short Term Goal 3 (Week 4): Pt will complete toilet transfer with min assist OT Short Term Goal 4 (Week 4): Pt will complete LB dressing with min assist  Skilled Therapeutic Interventions/Progress Updates:    Pt seen for ADL retraining with focus on increased participation in LB dressing tasks, transfers, sit <> stand, dynamic standing, and carryover of hemi-dressing technique. Pt completed squat pivot from bed > w/c to Lt with min assist this session with improved weight shifting and body positioning.  Performed stand pivot transfer w/c > tub bench in walk-in shower with mod assist and use of grab bars to assist with weight shift and stability. Pt with horizontal adduction this session with support against gravity to attempt to wash Lt arm. Min assist to maintain standing while washing perineal area. Pt requires physical assistance to lift RLE to thread Rt pant leg of underwear and pants. Pt required assistance to pull pants over Rt hip, but was successful with pulling up underwear with steady assist to maintain standing posture.   Therapy Documentation Precautions:  Precautions Precautions: Fall Precaution Comments: right hemiparesis  Restrictions Weight Bearing Restrictions: No General:   Vital Signs: Therapy Vitals Temp: 98 F (36.7 C) Temp src: Oral Pulse Rate: 68 Resp: 16 BP: 131/77 mmHg Patient Position, if appropriate: Lying Oxygen Therapy SpO2: 96 % O2 Device: None (Room air) Pain:   ADL: ADL Grooming: Setup Where Assessed-Grooming: Sitting at sink Upper Body Bathing: Minimal assistance Where Assessed-Upper Body Bathing: Shower Lower Body Bathing: Moderate assistance Where Assessed-Lower  Body Bathing: Shower Upper Body Dressing: Minimal assistance Where Assessed-Upper Body Dressing: Sitting at sink Lower Body Dressing: Moderate assistance Where Assessed-Lower Body Dressing: Sitting at sink;Standing at sink Toilet Transfer: Moderate  assistance Toilet Transfer Method: Squat pivot Toilet Transfer Equipment: Acupuncturist: Moderate assistance Film/video editor Method: Warden/ranger: Transfer tub bench;Grab bars  See FIM for current functional status  Therapy/Group: Individual Therapy  Rosalio Loud 03/18/2013, 10:27 AM

## 2013-03-18 NOTE — Progress Notes (Signed)
Speech Language Pathology Daily Session Note  Patient Details  Name: Kevin Escobar MRN: [id number] Date of Birth: 10/21/1940  Today's Date: 03/18/2013 Time: 4098-1191 Time Calculation (min): 42 min  Short Term Goals: Week 3: SLP Short Term Goal 1 (Week 3): The patient will perform oral motor, pharyngeal strengthening and diaphragmatic breathing exercises with Supervision cuing.  SLP Short Term Goal 2 (Week 3): Patient will utilize compensatory strategies to increase speech intelligibility at the phrase level with Min verbal cues.   SLP Short Term Goal 3 (Week 3): Patient will consume Dys.3 textures and thin liquids with minimal s/s of aspiration.  SLP Short Term Goal 4 (Week 3): Patient will self-monitor and correct wet vocal quality with Min verbal cues.  Skilled Therapeutic Interventions: Skilled treatment session focused on addressing dysarthria goals.  SLP facilitated session with environment with mild background/environmental noise as well as Mod verbal cues to increase vocal intensity at the word and phrase level.  SLP also requested return demonstration of diaphragmatic breathing with Min cues to recall and utilize breath hold for 2-3 seconds prior to exhalation.     FIM:  Comprehension Comprehension Mode: Auditory Comprehension: 5-Follows basic conversation/direction: With no assist Expression Expression Mode: Verbal Expression: 3-Expresses basic 50 - 74% of the time/requires cueing 25 - 50% of the time. Needs to repeat parts of sentences. Social Interaction Social Interaction: 4-Interacts appropriately 75 - 89% of the time - Needs redirection for appropriate language or to initiate interaction. Problem Solving Problem Solving: 5-Solves basic 90% of the time/requires cueing < 10% of the time Memory Memory: 5-Recognizes or recalls 90% of the time/requires cueing < 10% of the time FIM - Eating Eating Activity: 5: Supervision/cues;5: Set-up assist for open containers;5: Set-up  assist for cut food  Pain Pain Assessment Pain Assessment: No/denies pain  Therapy/Group: Individual Therapy  Charlane Ferretti., CCC-SLP [phone]  Kevin Escobar 03/18/2013, 4:05 PM

## 2013-03-18 NOTE — Progress Notes (Signed)
Physical Therapy Note  Patient Details  Name: Kevin Escobar MRN: [id number] Date of Birth: 1940/11/19 Today's Date: 03/18/2013  2:00 - 3:00 60 minutes Individual session Patient reports pain from boil on back - otherwise no pain.  Patient sitting in wheelchair upon entering room. Patient propelled wheelchair with left extremities 150 feet on level tile in controlled environment. Treatment focused on wheelchair to mat transfers, bed mobility, and facilitation of right LE during ambulation in hallway using railing. Patient performed scooting transfers to left and right with min steady assist wheelchair to mat. Patient said he preferred to stand when going to right. Patient performed stand pivot transfers wheelchair <>mat with mod assist - some assistance with lifting. Patient sit to supine with lifting assist for right LE. Patient able to roll side to side without assistance. Patient performed left side lying to sit with min steady assist. Patient ambulated using rail in hallway 30 feet x 3 with blue rocker AFO and shoe cover and mod assist. Patient able to initiate swing on right but required assistance to complete swing phase without shoe cover 90% of the time. Patient tends to adduct right LE and needs assist with placement. With shoe cover, patient was able to advance right LE fully about 75% of the time. Patient able to maintain right LE control without buckling or genu recurvatum of right knee during stance. Patient left in room in wheelchair with all items in reach.    Arelia Longest M 03/18/2013, 3:14 PM

## 2013-03-18 NOTE — Progress Notes (Signed)
Patient ID: Kevin Escobar, male   DOB: [birth year], 72 y.o.   MRN: [id number] Subjective/Complaints: Burning with urination, improving. Started on Keflex. Urine culture below. No new complaints other than the above. ROS  A 12 point review of systems has been performed and if not noted above is otherwise negative.   Objective: Vital Signs: Blood pressure 132/75, pulse 68, temperature 98 F (36.7 C), temperature source Oral, resp. rate 16, weight 67.405 kg (148 lb 9.6 oz), SpO2 96.00%.     Results for orders placed during the hospital encounter of 02/26/13 (from the past 72 hour(s))  URINE CULTURE     Status: None   Collection Time    03/16/13  7:14 PM      Result Value Range   Specimen Description URINE, RANDOM     Special Requests NONE     Culture  Setup Time       Value: 03/16/2013 20:03     Performed at [facility] Count       Value: >=100,000 COLONIES/ML     Performed at [facility]   Culture       Value: PROTEUS MIRABILIS     Performed at [facility]   Report Status 03/18/2013 FINAL     Organism ID, Bacteria PROTEUS MIRABILIS    URINE CULTURE     Status: None   Collection Time    03/16/13 11:39 PM      Result Value Range   Specimen Description URINE, RANDOM     Special Requests none     Culture  Setup Time       Value: 03/17/2013 00:51     Performed at [facility] Count PENDING     Culture       Value: Culture reincubated for better growth     Performed at [facility]   Report Status PENDING       HEENT: Eyes not injected, oral mucosa moist, external ears normal Cardio: RRR and No murmur Resp: CTA B/L and unlabored GI: BS positive and Nontender Extremity:  Pulses positive and No Edema Skin:   Intact, right heel boggy Neuro: More Alert/Oriented, Flat, Cranial Nerve Abnormalities Right central 7, Normal Sensory, Abnormal Motor tr to 1/5 in the right upper except 1/5 R finger flexors.(trace tone at  wrist,fingers) RLE tr in HF, KE. 1/5 distally. Equinovarus position of right foot. Tone:    Dysarthria continues Musc/Skel:  Normal General no acute distress   Assessment/Plan: 1. 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 Physiatrist is providing close team supervision and 24 hour management of active medical problems listed below. Physiatrist and rehab team continue to assess barriers to discharge/monitor patient progress toward functional and medical goals.  FIM: FIM - Bathing Bathing Steps Patient Completed: Chest;Right Arm;Abdomen;Front perineal area;Buttocks;Right upper leg;Left upper leg Bathing: 3: Mod-Patient completes 5-7 95f 10 parts or 50-74%  FIM - Upper Body Dressing/Undressing Upper body dressing/undressing steps patient completed: Thread/unthread left sleeve of pullover shirt/dress;Put head through opening of pull over shirt/dress;Pull shirt over trunk Upper body dressing/undressing: 4: Min-Patient completed 75 plus % of tasks FIM - Lower Body Dressing/Undressing Lower body dressing/undressing steps patient completed: Thread/unthread right underwear leg;Thread/unthread left underwear leg;Pull underwear up/down;Thread/unthread left pants leg;Don/Doff left shoe;Fasten/unfasten left shoe Lower body dressing/undressing: 3: Mod-Patient completed 50-74% of tasks  FIM - Toileting Toileting: 0:  Activity did not occur  FIM - Diplomatic Services operational officer Devices: Bedside commode Toilet Transfers: 0-Activity did not occur  FIM - Banker Devices: Arm rests Bed/Chair Transfer: 4: Sit > Supine: Min A (steadying pt. > 75%/lift 1 leg);4: Supine > Sit: Min A (steadying Pt. > 75%/lift 1 leg);4: Bed > Chair or W/C: Min A (steadying Pt. > 75%);4: Chair or W/C > Bed: Min A (steadying Pt. > 75%)  FIM -  Locomotion: Wheelchair Distance: 150 Locomotion: Wheelchair: 5: Travels 150 ft or more: maneuvers on rugs and over door sills with supervision, cueing or coaxing FIM - Locomotion: Ambulation Locomotion: Ambulation Assistive Devices: Other (comment) (railing in hallway) Ambulation/Gait Assistance: 3: Mod assist Locomotion: Ambulation: 1: Travels less than 50 ft with moderate assistance (Pt: 50 - 74%)  Comprehension Comprehension Mode: Auditory Comprehension: 5-Follows basic conversation/direction: With no assist  Expression Expression Mode: Verbal Expression: 3-Expresses basic 50 - 74% of the time/requires cueing 25 - 50% of the time. Needs to repeat parts of sentences.  Social Interaction Social Interaction Mode: Asleep Social Interaction: 4-Interacts appropriately 75 - 89% of the time - Needs redirection for appropriate language or to initiate interaction.  Problem Solving Problem Solving Mode: Asleep Problem Solving: 5-Solves basic 90% of the time/requires cueing < 10% of the time  Memory Memory Mode: Asleep Memory: 5-Recognizes or recalls 90% of the time/requires cueing < 10% of the time   Medical Problem List and Plan:  1. DVT Prophylaxis/Anticoagulation: Pharmaceutical: Lovenox  2. Pain Management: will add scheduled hs tramadol for back pain. 3. Mood: Provide anxiety/mood support. Has a supportive family. On nicotine patch  -changed to lexapro 5mg  and give earlier in evening to reduce am sedation, increase ambien for sleep monitor am sedation 4. Neuropsych: This patient is capable of making decisions on his own behalf.  5. HTN: will monitor with bid checks. Continue metoprolol, norvasc and catapres. Off lasix due to renal insufficiency/dysphagia diet. Mainly systolic elevation. Clonidine  adjusted. No changes at  present 6. Reactive leucocytosis: Resolving.  7. Dyslipidemia: Continue lipitor.  8. Dysphagia: continue D2, nectar liquids. Continue to educate patient and family  on compliance of current restrictions and aspiration risk.   9. Prostate cancer/bladder CA: H/o frequency. Toilet every 2-3 hours to help with continence. Condom cath at night.  10. CKD: Will monitor with routine checks. Currently at baseline.  11.  UTI keflex sensitive  LOS (Days) 20 A FACE TO FACE EVALUATION WAS PERFORMED  KIRSTEINS,ANDREW E 03/18/2013, 4:24 PM

## 2013-03-18 NOTE — Progress Notes (Signed)
Social Work Patient ID: [name], male   DOB: 1941-04-20, 72 y.o.   MRN: [id number]  CSW met with pt and his wife to update them on results of Team Conference.  Pt is working toward goals, but making slow progress.  Pt's dtr had expressed concern via telephone before conference of pt needing more time on Rehab Unit.  CSW relayed this concern to the team and the therapists and the MD felt that pt would not necessarily make more progress with more time here and that an extended stay would not be best at this time.  CSW told dtr and wife of this.  Wife is adamant that she can care for pt at home and this is what she has time to do and wants to do.  CSW explained what the care would involve and she has confidence in herself.  Wife will need more hands on family education with the therapists.  Wife also feels pt will do better at home.  Explained to family that diet has been advanced and that pt is grasping some and moved his hand to his mouth, which is progress.  Dtr expressed concern about pt not being reasonable about which car to use, as his SUV is harder to get in and out of, but he wants to ride in it rather than wife's sedan.  CSW informed Clydie Braun, Web designer, and she plans to follow up with pt after further testing today.  No other concerns noted at this time.  CSW will continue to follow and assist pt/family as he nears his d/c date and will need equipment and therapies ordered.

## 2013-03-18 NOTE — Progress Notes (Signed)
Social Work Patient ID: Kevin Escobar, male   DOB: [birth year], 72 y.o.   MRN: [id number]  Kevin Deck Berkleigh Beckles, LCSW Social Worker Addendum  Patient Care Conference Service date: 03/17/2013 10:14 AM  Inpatient RehabilitationTeam Conference and Plan of Care Update Date: 03/17/2013   Time: 11:30 AM     Patient Name: Kevin Escobar       Medical Record Number: [id number]   Date of Birth: 25-Jan-1941 Sex: Male         Room/Bed: 4W07C/4W07C-01 Payor Info: Payor: MEDICARE / Plan: MEDICARE PART A AND B / Product Type: *No Product type* /   Admitting Diagnosis: L CVA   Admit Date/Time:  02/26/2013  4:57 PM Admission Comments: No comment available   Primary Diagnosis:  CVA (cerebral vascular accident) Principal Problem: CVA (cerebral vascular accident)    Patient Active Problem List     Diagnosis  Date Noted   .  Other emphysema  03/03/2013   .  Hypokalemia  02/22/2013   .  Depression  02/21/2013   .  PVD (peripheral vascular disease)  02/21/2013   .  CVA (cerebral vascular accident)  02/20/2013   .  Chronic kidney disease     .  Hypertension     .  TIA (transient ischemic attack)     .  Peripheral vascular disease       Expected Discharge Date: Expected Discharge Date: 03/26/13  Team Members Present: Physician leading conference: Dr. Claudette Laws Social Worker Present: Amada Jupiter, LCSW;Jenny Yony Roulston, LCSW Nurse Present: Other (comment) Janeece Agee, RN) PT Present: Edman Circle, PT;Bridgett Ripa, Lillie Columbia, PT OT Present: Rosalio Loud, OT;James Doran Durand, OT SLP Present: Fae Pippin, Charlie Pitter, SLP        Current Status/Progress  Goal  Weekly Team Focus   Medical     Urinary tract infection, poor sleep  Improve sleep, treat UTI  Start medications   Bowel/Bladder     Continent of bowel,  occasional incontinence of bladder, condom cath at [facility]  continent of bowel, infrequent incontinence of bladder  continue timed toileting    Swallow/Nutrition/ Hydration     Dys.3 textures and thin liquids with full supervision   least restrictive p.o. intake  increase crryover and use of compensatory strategies and trials of regular textures   ADL's     mod assist bathing, min assist UB dressing, mod assist LB dressing, min-mod assist transfers, min-mod assist sit <> stand. Rt grasp developing and first finger extension  min assist overall  transfers, sit <> stand, standing tolerance, dynamic sitting and standing balance, RUE NM re-ed   Mobility     Min A bed mobility, mod-max A basic transfers and gait with blue rocker and GiveMohr sling, supervision w/c mobility  Min A overall  R activation, gait, transfers, stairs and education with wife   Communication     Mod assist  Min assist  increase breath support and coordination of speech during onset exhulation   Safety/Cognition/ Behavioral Observations    WFL with basic       Pain     Pain in Right wrist, right ankl;e and foot. voltarin gel scheduled  pain < 3/10  Voltarin as scheduled, prn pain med as needed   Skin     Skin CDI  No skin breakdown  Turn q2h, mopnitor skin q shift    Rehab Goals Patient on target to meet rehab goals: Yes *See Care Plan and progress notes for long and short-term  goals.    Barriers to Discharge:  Neurologic deficits, mood      Possible Resolutions to Barriers:    Continued therapy program, neuropsych      Discharge Planning/Teaching Needs:    Pt/family are still planning for pt to go home at d/c.  Pt's wife is very willing to learn how to provide pt with the support he will need at home.   Pt's wife needs to be involved hands-on with therapies.    Team Discussion:    Pt is having some dysuria and MD is addressing this.  Pt has progressed to D3 diet with thin liquids.  ST is still working on decreased breath support.  Pt is making slow progress with therapies.  With OT, pt was able to grasp some and brought his right hand to his mouth.   With PT, pt is working on stairs.  Therapists want to do more family education with pt's wife.  There was discussion about pt being more W/C level.   Revisions to Treatment Plan:    Pt is making slow progress so goals may need to be re-evaluated.    Continued Need for Acute Rehabilitation Level of Care: The patient requires daily medical management by a physician with specialized training in physical medicine and rehabilitation for the following conditions: Daily direction of a multidisciplinary physical rehabilitation program to ensure safe treatment while eliciting the highest outcome that is of practical value to the patient.: Yes Daily medical management of patient stability for increased activity during participation in an intensive rehabilitation regime.: Yes Daily analysis of laboratory values and/or radiology reports with any subsequent need for medication adjustment of medical intervention for : Neurological problems;Other  Kevin Escobar, Kevin Deck 03/18/2013, 11:49 AM    Revision History...     Date/Time User Action   03/18/2013 2:00 PM Kevin Deck Haron Beilke, LCSW Addend   03/18/2013 11:56 AM Kevin Deck Jamy Cleckler, LCSW Sign  View Details Report

## 2013-03-19 ENCOUNTER — Inpatient Hospital Stay (HOSPITAL_COMMUNITY): Payer: Medicare Other | Admitting: Speech Pathology

## 2013-03-19 ENCOUNTER — Inpatient Hospital Stay (HOSPITAL_COMMUNITY): Payer: Medicare Other

## 2013-03-19 ENCOUNTER — Inpatient Hospital Stay (HOSPITAL_COMMUNITY): Payer: Medicare Other | Admitting: Occupational Therapy

## 2013-03-19 ENCOUNTER — Inpatient Hospital Stay (HOSPITAL_COMMUNITY): Payer: Medicare Other | Admitting: Physical Therapy

## 2013-03-19 MED ORDER — CEPHALEXIN 500 MG PO CAPS
500.0000 mg | ORAL_CAPSULE | Freq: Three times a day (TID) | ORAL | Status: DC
  Administered 2013-03-19 – 2013-03-20 (×4): 500 mg via ORAL
  Filled 2013-03-19 (×6): qty 1

## 2013-03-19 NOTE — Progress Notes (Signed)
Patient ID: Kevin Escobar, male   DOB: Jul 30, 1941, 72 y.o.   MRN: [id number] Subjective/Complaints: No burning with urination, slept poorly last night because of constipation, refused suppository yesterday but is willing to try today ROS  A 12 point review of systems has been performed and if not noted above is otherwise negative.   Objective: Vital Signs: Blood pressure 136/72, pulse 66, temperature 97.4 F (36.3 C), temperature source Oral, resp. rate 18, weight 67.405 kg (148 lb 9.6 oz), SpO2 97.00%.     Results for orders placed during the hospital encounter of 02/26/13 (from the past 72 hour(s))  URINE CULTURE     Status: None   Collection Time    03/16/13  7:14 PM      Result Value Range   Specimen Description URINE, RANDOM     Special Requests NONE     Culture  Setup Time       Value: 03/16/2013 20:03     Performed at [facility] Count       Value: >=100,000 COLONIES/ML     Performed at [facility]   Culture       Value: PROTEUS MIRABILIS     Performed at [facility]   Report Status 03/18/2013 FINAL     Organism ID, Bacteria PROTEUS MIRABILIS    URINE CULTURE     Status: None   Collection Time    03/16/13 11:39 PM      Result Value Range   Specimen Description URINE, RANDOM     Special Requests none     Culture  Setup Time       Value: 03/17/2013 00:51     Performed at [facility] Count       Value: >=100,000 COLONIES/ML     Performed at [facility]   Culture       Value: GRAM NEGATIVE RODS     Performed at [facility]   Report Status PENDING       HEENT: Eyes not injected, oral mucosa moist, external ears normal Cardio: RRR and No murmur Resp: CTA B/L and unlabored GI: BS positive and Nontender Extremity:  Pulses positive and No Edema Skin:   Intact, right heel boggy Neuro: More Alert/Oriented, Flat, Cranial Nerve Abnormalities Right central 7, Normal Sensory, Abnormal Motor tr to  1/5 in the right upper except 1/5 R finger flexors.(trace tone at wrist,fingers) RLE tr in HF, KE. 1/5 distally. Equinovarus position of right foot. Tone:    Dysarthria continues Musc/Skel:  Normal General no acute distress   Assessment/Plan: 1. 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 Physiatrist is providing close team supervision and 24 hour management of active medical problems listed below. Physiatrist and rehab team continue to assess barriers to discharge/monitor patient progress toward functional and medical goals.  FIM: FIM - Bathing Bathing Steps Patient Completed: Chest;Right Arm;Abdomen;Front perineal area;Buttocks;Right upper leg;Left upper leg;Left lower leg (including foot) Bathing: 4: Min-Patient completes 8-9 2f 10 parts or 75+ percent  FIM - Upper Body Dressing/Undressing Upper body dressing/undressing steps patient completed: Thread/unthread left sleeve of pullover shirt/dress;Put head through opening of pull over shirt/dress;Pull shirt over trunk Upper body dressing/undressing: 4: Min-Patient completed 75 plus % of tasks FIM - Lower Body Dressing/Undressing Lower body dressing/undressing steps patient completed: Thread/unthread right underwear leg;Thread/unthread left underwear leg;Pull underwear up/down;Thread/unthread right  pants leg;Thread/unthread left pants leg;Don/Doff left shoe;Fasten/unfasten left shoe Lower body dressing/undressing: 3: Mod-Patient completed 50-74% of tasks  FIM - Toileting Toileting: 0: Activity did not occur  FIM - Diplomatic Services operational officer Devices: Psychiatrist Transfers: 0-Activity did not occur  FIM - Banker Devices: Arm rests Bed/Chair Transfer: 4: Supine > Sit: Min A (steadying Pt. > 75%/lift 1 leg);4: Bed > Chair or W/C: Min A (steadying  Pt. > 75%)  FIM - Locomotion: Wheelchair Distance: 150 Locomotion: Wheelchair: 5: Travels 150 ft or more: maneuvers on rugs and over door sills with supervision, cueing or coaxing FIM - Locomotion: Ambulation Locomotion: Ambulation Assistive Devices: Other (comment) (railing in hallway) Ambulation/Gait Assistance: 3: Mod assist Locomotion: Ambulation: 1: Travels less than 50 ft with moderate assistance (Pt: 50 - 74%)  Comprehension Comprehension Mode: Auditory Comprehension: 5-Follows basic conversation/direction: With no assist  Expression Expression Mode: Verbal Expression: 3-Expresses basic 50 - 74% of the time/requires cueing 25 - 50% of the time. Needs to repeat parts of sentences.  Social Interaction Social Interaction Mode: Asleep Social Interaction: 4-Interacts appropriately 75 - 89% of the time - Needs redirection for appropriate language or to initiate interaction.  Problem Solving Problem Solving Mode: Asleep Problem Solving: 5-Solves basic 90% of the time/requires cueing < 10% of the time  Memory Memory Mode: Asleep Memory: 5-Recognizes or recalls 90% of the time/requires cueing < 10% of the time   Medical Problem List and Plan:  1. DVT Prophylaxis/Anticoagulation: Pharmaceutical: Lovenox  2. Pain Management: will add scheduled hs tramadol for back pain. 3. Mood: Provide anxiety/mood support. Has a supportive family. On nicotine patch  -changed to lexapro 5mg  and give earlier in evening to reduce am sedation, increase ambien for sleep monitor am sedation 4. Neuropsych: This patient is capable of making decisions on his own behalf.  5. HTN: will monitor with bid checks. Continue metoprolol, norvasc and catapres. Off lasix due to renal insufficiency/dysphagia diet. Mainly systolic elevation. Clonidine  adjusted. No changes at  present 6. Reactive leucocytosis: Resolving.  7. Dyslipidemia: Continue lipitor.  8. Dysphagia: continue D2, nectar liquids. Continue to  educate patient and family on compliance of current restrictions and aspiration risk.   9. Prostate cancer/bladder CA: H/o frequency. Toilet every 2-3 hours to help with continence. Condom cath at night.  10. CKD: Will monitor with routine checks. Currently at baseline.  11.  UTI keflex sensitive  LOS (Days) 21 A FACE TO FACE EVALUATION WAS PERFORMED  Nekeisha Aure E 03/19/2013, 1:24 PM

## 2013-03-19 NOTE — Progress Notes (Signed)
Speech Language Pathology Daily Session Note  Patient Details  Name: Kevin Escobar MRN: [id number] Date of Birth: 1940-11-18  Today's Date: 03/19/2013 Time: 1530-1600 Time Calculation (min): 30 min  Short Term Goals: Week 3: SLP Short Term Goal 1 (Week 3): The patient will perform oral motor, pharyngeal strengthening and diaphragmatic breathing exercises with Supervision cuing.  SLP Short Term Goal 2 (Week 3): Patient will utilize compensatory strategies to increase speech intelligibility at the phrase level with Min verbal cues.   SLP Short Term Goal 3 (Week 3): Patient will consume Dys.3 textures and thin liquids with minimal s/s of aspiration.  SLP Short Term Goal 4 (Week 3): Patient will self-monitor and correct wet vocal quality with Min verbal cues.  Skilled Therapeutic Interventions: Skilled treatment session focused on addressing dysarthria goals.  SLP facilitated session with environment with mild background/environmental noise as well as Min-Mod verbal cues to increase vocal intensity at the phrase level.  Following a structured activity that focused on chunking speech following this activity patient was able to demonstrate carry over to a more unstructured conversational task.     FIM:  Comprehension Comprehension Mode: Auditory Comprehension: 5-Follows basic conversation/direction: With extra time/assistive device Expression Expression Mode: Verbal Expression: 3-Expresses basic 50 - 74% of the time/requires cueing 25 - 50% of the time. Needs to repeat parts of sentences. Social Interaction Social Interaction: 4-Interacts appropriately 75 - 89% of the time - Needs redirection for appropriate language or to initiate interaction. Problem Solving Problem Solving: 5-Solves basic 90% of the time/requires cueing < 10% of the time Memory Memory: 5-Recognizes or recalls 90% of the time/requires cueing < 10% of the time FIM - Eating Eating Activity: 5: Supervision/cues;5: Set-up  assist for open containers;5: Set-up assist for cut food  Pain Pain Assessment Pain Assessment: No/denies pain  Therapy/Group: Individual Therapy  Charlane Ferretti., CCC-SLP [phone]  [name] 03/19/2013, 4:23 PM

## 2013-03-19 NOTE — Progress Notes (Signed)
Speech Language Pathology Daily Session Note  Patient Details  Name: Kevin Escobar MRN: [id number] Date of Birth: 1941-08-01  Today's Date: 03/19/2013 Time: 1200-1210 Time Calculation (min): 10 min  Short Term Goals: Week 3: SLP Short Term Goal 1 (Week 3): The patient will perform oral motor, pharyngeal strengthening and diaphragmatic breathing exercises with Supervision cuing.  SLP Short Term Goal 2 (Week 3): Patient will utilize compensatory strategies to increase speech intelligibility at the phrase level with Min verbal cues.   SLP Short Term Goal 3 (Week 3): Patient will consume Dys.3 textures and thin liquids with minimal s/s of aspiration.  SLP Short Term Goal 4 (Week 3): Patient will self-monitor and correct wet vocal quality with Min verbal cues.  Skilled Therapeutic Interventions: Skilled group co- treatment session with OT focus on addressing self-care goals and SLP facilitated session by addressing dysphagia goals. SLP facilitated session with lunch of regular textures and thin liquids. Patient required increased wait time for efficient mastication of regular textures.  Patient demonstrated no overt s/s of aspiration.     FIM:  Comprehension Comprehension Mode: Auditory Comprehension: 5-Follows basic conversation/direction: With extra time/assistive device Expression Expression Mode: Verbal Expression: 3-Expresses basic 50 - 74% of the time/requires cueing 25 - 50% of the time. Needs to repeat parts of sentences. Social Interaction Social Interaction: 4-Interacts appropriately 75 - 89% of the time - Needs redirection for appropriate language or to initiate interaction. Problem Solving Problem Solving: 5-Solves basic 90% of the time/requires cueing < 10% of the time Memory Memory: 5-Recognizes or recalls 90% of the time/requires cueing < 10% of the time FIM - Eating Eating Activity: 5: Supervision/cues;5: Set-up assist for open containers;5: Set-up assist for cut  food  Pain Pain Assessment Pain Assessment: No/denies pain  Therapy/Group: Group Therapy  Charlane Ferretti., CCC-SLP [phone]  Shwanda Soltis 03/19/2013, 4:19 PM

## 2013-03-19 NOTE — Progress Notes (Signed)
Occupational Therapy Session Note  Patient Details  Name: Kevin Escobar MRN: [id number] Date of Birth: 08-06-1940  Today's Date: 03/19/2013 Time: 1030-1130 Time Calculation (min): 60 min  Short Term Goals: Week 4:  OT Short Term Goal 1 (Week 4): Pt will wash BLE including feet with steady assist OT Short Term Goal 2 (Week 4): Pt will complete bathing with min assist OT Short Term Goal 3 (Week 4): Pt will complete toilet transfer with min assist OT Short Term Goal 4 (Week 4): Pt will complete LB dressing with min assist  Skilled Therapeutic Interventions/Progress Updates:    Pt seen for ADL retraining with focus on increased participation in LB bathing and dressing tasks, transfers, sit <> stand, dynamic standing, and carryover of hemi-dressing technique. Bed > w/c transfer with min assist to Lt with improved weight shift. Bathing and dressing completed at sit <> stand level at sink per pt's request. Pt utilized RUE grasp to assist in pulling gown off Lt arm with hand over hand assist.  Issued pt long handled sponge to assist with LB bathing with pt demonstrating ability to wash LLE, however still requiring steady assist to wash RLE.  Pt with horizontal adduction this session with support against gravity and hand over hand assist to wash Lt arm and to apply deodorant. Min assist to maintain standing while washing perineal area. Pt requires physical assistance to lift RLE to thread Rt pant leg of underwear and pants. Pt required assistance to pull pants over Rt hip, but was successful with pulling up underwear with steady assist to maintain standing posture. Oral hygiene completed with hemi-technique and setup assist.    Therapy Documentation Precautions:  Precautions Precautions: Fall Precaution Comments: right hemiparesis  Restrictions Weight Bearing Restrictions: No General:   Vital Signs: Therapy Vitals Temp: 97.4 F (36.3 C) Temp src: Oral Pulse Rate: 66 Resp: 18 BP: 136/72  mmHg Patient Position, if appropriate: Sitting Oxygen Therapy SpO2: 97 % Pain: Pain Assessment Pain Assessment: No/denies pain  See FIM for current functional status  Therapy/Group: Individual Therapy  Rosalio Loud 03/19/2013, 11:52 AM

## 2013-03-19 NOTE — Progress Notes (Signed)
Physical Therapy Weekly Progress Note  Patient Details  Name: Kevin Escobar MRN: [id number] Date of Birth: 10/08/1940  Today's Date: 03/19/2013 Time: 1330-1430 Time Calculation (min): 60 min  Patient has made excellent progress and has met 4 of 5 short term goals.  Pt is currently min A for bed mobility on flat bed to L side, min-mod A stand pivot bed <> w/c with hemi walker for LUE support and intermittent assistance to pivot R foot, mod A for gait with hemi walker and orthosis and stair negotiation laterally with R rail.  Wife to be present for therapy next week to continue and finalize family education and training.    Patient continues to demonstrate the following deficits: impaired activity tolerance/endurance, R hemiplegia with impaired motor control, timing, sequencing, impaired postural control, balance, gait, and therefore will continue to benefit from skilled PT intervention to enhance overall performance with activity tolerance, balance, postural control, ability to compensate for deficits, functional use of  right upper extremity and right lower extremity and coordination.  Patient progressing toward long term goals..  Continue plan of care.  PT Short Term Goals Week 3:  PT Short Term Goal 1 (Week 3): Pt will perform bed mobility on flat bed consistently with min A and verbalizing sequence to wife PT Short Term Goal 1 - Progress (Week 3): Met PT Short Term Goal 2 (Week 3): Pt will performed bed <> w/c transfers to L and R consistently with min A PT Short Term Goal 2 - Progress (Week 3): Partly met PT Short Term Goal 3 (Week 3): Pt will perform w/c mobility in controlled and home environments with supervision x 150' PT Short Term Goal 3 - Progress (Week 3): Met PT Short Term Goal 4 (Week 3): Pt will perform ambulation with LRAD and appropriate orthoses x 25' and mod A PT Short Term Goal 4 - Progress (Week 3): Met PT Short Term Goal 5 (Week 3): Pt will ascend and descend 4 stairs  with one rail, appropriate orthosis and mod A PT Short Term Goal 5 - Progress (Week 3): Met Week 4:  PT Short Term Goal 1 (Week 4): = LTG  Skilled Therapeutic Interventions/Progress Updates:   Performed transfer training mat <> w/c stand pivot to L and R with LUE support on hemi walker and orthosis donned to assist with R foot clearance with min-mod A with verbal cues for L hand placement to push during sit > stand and intermittent assistance to advance or pivot R foot when turning to R.  Pt attempting to pull up on hemiwalker and allowed one supervised planned failure with patient falling backwards onto mat while pulling up on hemi walker; pt states he understands why he shouldn't pull up on it since it tips with him.  Also performed sit <> supine training on flat mat, no rail with min A with pt hooking LLE under RLE to bring onto mat with min A and min A to flex RLE to roll back to L side.  Performed gait training with hemi walker and orthosis x 12' to L and R with min-mod A with verbal and tactile cues for lateral weight shifting and sequence of walker placement and stepping.  Intermittent assistance needed to advance RLE.  Continued stair negotiation training with R rail ascending laterally and descending forwards with mod A to assist with advancing RLE, verbal cues for sequence and tactile cues for lateral/anterior weight shifting and weight acceptance on RLE.  Discussed car transfer again; pt  reports he has a tall SUV and a 4 door car.  Wife feels that car will be easier to enter secondary to height of SUV.  Pt performed stand pivot w/c <> simulated car with UE support on handle or door with min-mod A for pivot and min A to bring RLE into and out of car.  Discussed family education times next week with wife and equipment needs with wife and Child psychotherapist.  Pt will likely need orthosis, hemi walker, regular w/c for in the house for prolonged sitting and transport w/c for community to allow wife to lift  in/out of trunk of car.    Therapy Documentation Precautions:  Precautions Precautions: Fall Precaution Comments: right hemiparesis  Restrictions Weight Bearing Restrictions: No Vital Signs: Therapy Vitals Temp: 97.2 F (36.2 C) Temp src: Oral Pulse Rate: 60 Resp: 18 BP: 129/73 mmHg Patient Position, if appropriate: Sitting Oxygen Therapy SpO2: 99 % Pain: Pain Assessment Pain Assessment: No/denies pain Pain Score: 1  Pain Type: Acute pain Pain Location: Back Pain Orientation: Mid Pain Descriptors / Indicators: Discomfort Pain Frequency: Occasional Pain Onset: Other (Comment) (cyst on mid back) Patients Stated Pain Goal: 0 Pain Intervention(s): Repositioned Multiple Pain Sites: No Locomotion : Ambulation Ambulation/Gait Assistance: 3: Mod assist Wheelchair Mobility Distance: 150   See FIM for current functional status  Therapy/Group: Individual Therapy  Kevin Escobar [facility] 03/19/2013, 4:56 PM

## 2013-03-19 NOTE — Progress Notes (Signed)
Occupational Therapy Session Note  Patient Details  Name: Kevin Escobar MRN: [id number] Date of Birth: [birth year]  Today's Date: 03/19/2013 Time: 1130-1200 Time Calculation (min): 30 min  Short Term Goals: Week 3:  OT Short Term Goal 1 (Week 3): Pt will wash BLE including feet with steady assist OT Short Term Goal 1 - Progress (Week 3): Not progressing OT Short Term Goal 2 (Week 3): Pt will complete LB dressing with mod assist OT Short Term Goal 2 - Progress (Week 3): Met OT Short Term Goal 3 (Week 3): Pt will complete bathing with min assist OT Short Term Goal 3 - Progress (Week 3): Progressing toward goal OT Short Term Goal 4 (Week 3): Pt will complete toilet transfer with min assist OT Short Term Goal 4 - Progress (Week 3): Progressing toward goal  Skilled Therapeutic Interventions/Progress Updates:    [employer] with SLP Pt participated in self-feeding group with focus on utilizing safe swallow tech and functional use of RUE. Pt required max assist for gross grasp on utensils and to bring food to mouth with gravity eliminated. Pt required encouragement for self feeding with RUE.   Therapy Documentation Precautions:  Precautions Precautions: Fall Precaution Comments: right hemiparesis  Restrictions Weight Bearing Restrictions: No General:   Vital Signs: Therapy Vitals Temp: 97.4 F (36.3 C) Temp src: Oral Pulse Rate: 66 Resp: 18 BP: 136/72 mmHg Patient Position, if appropriate: Sitting Oxygen Therapy SpO2: 97 % Pain: Pain Assessment Pain Assessment: No/denies pain ADL: ADL Grooming: Setup Where Assessed-Grooming: Sitting at sink Upper Body Bathing: Minimal assistance Where Assessed-Upper Body Bathing: Shower Lower Body Bathing: Moderate assistance Where Assessed-Lower Body Bathing: Shower Upper Body Dressing: Minimal assistance Where Assessed-Upper Body Dressing: Sitting at sink Lower Body Dressing: Moderate assistance Where Assessed-Lower Body  Dressing: Sitting at sink;Standing at sink Toilet Transfer: Moderate assistance Toilet Transfer Method: Squat pivot Toilet Transfer Equipment: Acupuncturist: Moderate assistance Film/video editor Method: Warden/ranger: Transfer tub bench;Grab bars Exercises:   Other Treatments:    See FIM for current functional status  Therapy/Group: Group Therapy  [name], Vara Guardian 03/19/2013, 12:36 PM

## 2013-03-20 ENCOUNTER — Encounter (HOSPITAL_COMMUNITY): Payer: Medicare Other

## 2013-03-20 DIAGNOSIS — I6330 Cerebral infarction due to thrombosis of unspecified cerebral artery: Secondary | ICD-10-CM

## 2013-03-20 DIAGNOSIS — G8110 Spastic hemiplegia affecting unspecified side: Secondary | ICD-10-CM

## 2013-03-20 DIAGNOSIS — I69991 Dysphagia following unspecified cerebrovascular disease: Secondary | ICD-10-CM

## 2013-03-20 MED ORDER — CIPROFLOXACIN HCL 250 MG PO TABS
250.0000 mg | ORAL_TABLET | Freq: Every day | ORAL | Status: DC
Start: 2013-03-20 — End: 2013-03-26
  Administered 2013-03-20 – 2013-03-26 (×7): 250 mg via ORAL
  Filled 2013-03-20 (×8): qty 1

## 2013-03-20 NOTE — Progress Notes (Signed)
SLP Cancellation Note  Patient Details Name: Kevin Escobar MRN: [id number] DOB: 03/16/1941   Cancelled treatment:        Pt missed 60 minutes of co-treatment with SLP and OT this afternoon, as he declined to participate in [employer]. Pt and wife aware of scheduled therapy and declined, stating that they had been told that they did not need to participate in [employer] anymore.    Maxcine Ham 03/20/2013, 12:56 PM  Maxcine Ham, M.A. CCC-SLP

## 2013-03-20 NOTE — Progress Notes (Signed)
Patient ID: Kevin Escobar, male   DOB: 04/05/1941, 72 y.o.   MRN: [id number] Subjective/Complaints: Slept better but c/o painful cyst on back. ROS  A 12 point review of systems has been performed and if not noted above is otherwise negative.   Objective: Vital Signs: Blood pressure 123/59, pulse 57, temperature 98.1 F (36.7 C), temperature source Oral, resp. rate 18, weight 67.405 kg (148 lb 9.6 oz), SpO2 97.00%.     No results found for this or any previous visit (from the past 72 hour(s)).   HEENT: Eyes not injected, oral mucosa moist, external ears normal Cardio: RRR and No murmur Resp: CTA B/L and unlabored GI: BS positive and Nontender Extremity:  Pulses positive and No Edema Skin:   Low thoracic back notable for sebaceous cyst which is red/tender, does not appear infected. No drainage. Neuro: More Alert/Oriented, Flat, Cranial Nerve Abnormalities Right central 7, Normal Sensory, Abnormal Motor tr to 1/5 in the right upper except 1/5 R finger flexors.(trace tone at wrist,fingers) RLE tr in HF, KE. 1/5 distally. Equinovarus position of right foot. Tone:    Dysarthria continues Musc/Skel:  Normal General no acute distress   Assessment/Plan: 1. 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 Physiatrist is providing close team supervision and 24 hour management of active medical problems listed below. Physiatrist and rehab team continue to assess barriers to discharge/monitor patient progress toward functional and medical goals.  FIM: FIM - Bathing Bathing Steps Patient Completed: Chest;Right Arm;Abdomen;Front perineal area;Buttocks;Right upper leg;Left upper leg;Left lower leg (including foot) Bathing: 4: Min-Patient completes 8-9 30f 10 parts or 75+ percent  FIM - Upper Body Dressing/Undressing Upper body dressing/undressing steps patient completed:  Thread/unthread left sleeve of pullover shirt/dress;Put head through opening of pull over shirt/dress;Pull shirt over trunk Upper body dressing/undressing: 4: Min-Patient completed 75 plus % of tasks FIM - Lower Body Dressing/Undressing Lower body dressing/undressing steps patient completed: Thread/unthread right underwear leg;Thread/unthread left underwear leg;Pull underwear up/down;Thread/unthread right pants leg;Thread/unthread left pants leg;Don/Doff left shoe;Fasten/unfasten left shoe Lower body dressing/undressing: 3: Mod-Patient completed 50-74% of tasks  FIM - Toileting Toileting: 0: Activity did not occur  FIM - Diplomatic Services operational officer Devices: Psychiatrist Transfers: 0-Activity did not occur  FIM - Banker Devices: Walker;Orthosis Bed/Chair Transfer: 4: Supine > Sit: Min A (steadying Pt. > 75%/lift 1 leg);4: Sit > Supine: Min A (steadying pt. > 75%/lift 1 leg);4: Bed > Chair or W/C: Min A (steadying Pt. > 75%);4: Chair or W/C > Bed: Min A (steadying Pt. > 75%)  FIM - Locomotion: Wheelchair Distance: 150 Locomotion: Wheelchair: 1: Total Assistance/staff pushes wheelchair (Pt<25%) FIM - Locomotion: Ambulation Locomotion: Ambulation Assistive Devices: Walker - Hemi;Orthosis Ambulation/Gait Assistance: 3: Mod assist Locomotion: Ambulation: 1: Travels less than 50 ft with moderate assistance (Pt: 50 - 74%)  Comprehension Comprehension Mode: Auditory Comprehension: 5-Follows basic conversation/direction: With extra time/assistive device  Expression Expression Mode: Verbal Expression: 3-Expresses basic 50 - 74% of the time/requires cueing 25 - 50% of the time. Needs to repeat parts of sentences.  Social Interaction Social Interaction Mode: Asleep Social Interaction: 4-Interacts appropriately 75 - 89% of the time - Needs redirection for appropriate language or to initiate interaction.  Problem  Solving Problem Solving Mode: Asleep Problem Solving: 5-Solves basic 90% of the time/requires cueing < 10% of the time  Memory Memory Mode: Asleep Memory: 5-Recognizes or recalls 90%  of the time/requires cueing < 10% of the time   Medical Problem List and Plan:  1. DVT Prophylaxis/Anticoagulation: Pharmaceutical: Lovenox  2. Pain Management: will add scheduled hs tramadol for back pain. 3. Mood: Provide anxiety/mood support. Has a supportive family. On nicotine patch  -changed to lexapro 5mg  and give earlier in evening to reduce am sedation, increase ambien for sleep monitor am sedation 4. Neuropsych: This patient is capable of making decisions on his own behalf.  5. HTN: will monitor with bid checks. Continue metoprolol, norvasc and catapres. Off lasix due to renal insufficiency/dysphagia diet. Mainly systolic elevation. Clonidine  adjusted. improved 6. Reactive leucocytosis: Resolving.  7. Dyslipidemia: Continue lipitor.  8. Dysphagia: continue D2, nectar liquids. Continue to educate patient and family on compliance of current restrictions and aspiration risk.   9. Prostate cancer/bladder CA: H/o frequency. Toilet every 2-3 hours to help with continence. Condom cath at night.  10. CKD: Will monitor with routine checks. Currently at baseline.  11.  UTI keflex sensitive 12. Sebaceous cyst on back- moist heat, express any fluid contained inside  LOS (Days) 22 A FACE TO FACE EVALUATION WAS PERFORMED  SWARTZ,[name] 03/20/2013, 7:57 AM

## 2013-03-21 ENCOUNTER — Inpatient Hospital Stay (HOSPITAL_COMMUNITY): Payer: Medicare Other

## 2013-03-21 LAB — URINE CULTURE

## 2013-03-21 NOTE — Progress Notes (Signed)
Patient ID: Kevin Escobar, male   DOB: 1941/02/13, 72 y.o.   MRN: [id number] Subjective/Complaints: Slept soundly. Spot on back still somewhat sore ROS  A 12 point review of systems has been performed and if not noted above is otherwise negative.   Objective: Vital Signs: Blood pressure 146/78, pulse 56, temperature 98 F (36.7 C), temperature source Oral, resp. rate 18, height 5' 10.87" (1.8 m), weight 67.405 kg (148 lb 9.6 oz), SpO2 97.00%.     Results for orders placed during the hospital encounter of 02/26/13 (from the past 72 hour(s))  GLUCOSE, CAPILLARY     Status: None   Collection Time    03/20/13  7:50 AM      Result Value Range   Glucose-Capillary 83  70 - 99 mg/dL     HEENT: Eyes not injected, oral mucosa moist, external ears normal Cardio: RRR and No murmur Resp: CTA B/L and unlabored GI: BS positive and Nontender Extremity:  Pulses positive and No Edema Skin:   Low thoracic back notable for sebaceous cyst which is red/tender, does not appear infected. No drainage. Neuro: More Alert/Oriented, Flat, Cranial Nerve Abnormalities Right central 7, Normal Sensory, Abnormal Motor tr to 1/5 in the right upper except 1/5 R finger flexors.(trace tone at wrist,fingers) RLE tr in HF, KE. 1/5 distally. Equinovarus position of right foot. Tone:    Dysarthria continues Musc/Skel:  Normal General no acute distress   Assessment/Plan: 1. 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 Physiatrist is providing close team supervision and 24 hour management of active medical problems listed below. Physiatrist and rehab team continue to assess barriers to discharge/monitor patient progress toward functional and medical goals.  FIM: FIM - Bathing Bathing Steps Patient Completed: Chest;Right Arm;Abdomen;Front perineal area;Buttocks;Right upper leg;Left upper  leg;Left lower leg (including foot) Bathing: 4: Min-Patient completes 8-9 71f 10 parts or 75+ percent  FIM - Upper Body Dressing/Undressing Upper body dressing/undressing steps patient completed: Thread/unthread left sleeve of pullover shirt/dress;Put head through opening of pull over shirt/dress;Pull shirt over trunk Upper body dressing/undressing: 4: Min-Patient completed 75 plus % of tasks FIM - Lower Body Dressing/Undressing Lower body dressing/undressing steps patient completed: Thread/unthread right underwear leg;Thread/unthread left underwear leg;Pull underwear up/down;Thread/unthread right pants leg;Thread/unthread left pants leg;Don/Doff left shoe;Fasten/unfasten left shoe Lower body dressing/undressing: 3: Mod-Patient completed 50-74% of tasks  FIM - Toileting Toileting: 0: Activity did not occur  FIM - Diplomatic Services operational officer Devices: Psychiatrist Transfers: 0-Activity did not occur  FIM - Banker Devices: Bed rails Bed/Chair Transfer: 4: Chair or W/C > Bed: Min A (steadying Pt. > 75%)  FIM - Locomotion: Wheelchair Distance: 150 Locomotion: Wheelchair: 1: Total Assistance/staff pushes wheelchair (Pt<25%) FIM - Locomotion: Ambulation Locomotion: Ambulation Assistive Devices: Walker - Hemi;Orthosis Ambulation/Gait Assistance: 3: Mod assist Locomotion: Ambulation: 1: Travels less than 50 ft with moderate assistance (Pt: 50 - 74%)  Comprehension Comprehension Mode: Auditory Comprehension: 5-Follows basic conversation/direction: With extra time/assistive device  Expression Expression Mode: Verbal Expression: 3-Expresses basic 50 - 74% of the time/requires cueing 25 - 50% of the time. Needs to repeat parts of sentences.  Social Interaction Social Interaction Mode: Asleep Social Interaction: 4-Interacts appropriately 75 - 89% of the time - Needs redirection for appropriate language or to initiate  interaction.  Problem Solving Problem Solving Mode: Asleep Problem Solving: 5-Solves basic 90% of the time/requires cueing < 10%  of the time  Memory Memory Mode: Asleep Memory: 5-Recognizes or recalls 90% of the time/requires cueing < 10% of the time   Medical Problem List and Plan:  1. DVT Prophylaxis/Anticoagulation: Pharmaceutical: Lovenox  2. Pain Management: will add scheduled hs tramadol for back pain. 3. Mood: Provide anxiety/mood support. Has a supportive family. On nicotine patch  -changed to lexapro 5mg   qpm 4. Neuropsych: This patient is capable of making decisions on his own behalf.  5. HTN: will monitor with bid checks. Continue metoprolol, norvasc and catapres. Off lasix due to renal insufficiency/dysphagia diet. Mainly systolic elevation. Clonidine  adjusted. improved 6. Reactive leucocytosis: Resolving.  7. Dyslipidemia: Continue lipitor.  8. Dysphagia: continue D2, nectar liquids. Continue to educate patient and family on compliance of current restrictions and aspiration risk.   9. Prostate cancer/bladder CA: H/o frequency. Toilet every 2-3 hours to help with continence. Condom cath at night.  10. CKD: Will monitor with routine checks. Currently at baseline.  11.  UTI --proteus and serratia-reviewed with pharmacy yesterday---changed to cipro to cover both 12. Sebaceous cyst on back- continue moist heat, express any fluid contained inside as possible  LOS (Days) 23 A FACE TO FACE EVALUATION WAS PERFORMED  Nikita Humble T 03/21/2013, 7:40 AM

## 2013-03-21 NOTE — Progress Notes (Signed)
Occupational Therapy Session Note  Patient Details  Name: Kevin Escobar MRN: [id number] Date of Birth: 1941/03/10  Today's Date: 03/21/2013 Time: 7829-5621 Time Calculation (min): 48 min  Short Term Goals: Week 4:  OT Short Term Goal 1 (Week 4): Pt will wash BLE including feet with steady assist OT Short Term Goal 2 (Week 4): Pt will complete bathing with min assist OT Short Term Goal 3 (Week 4): Pt will complete toilet transfer with min assist OT Short Term Goal 4 (Week 4): Pt will complete LB dressing with min assist  Skilled Therapeutic Interventions/Progress Updates: Therapeutic activities with emphasis on dynamic sitting/standing balance, toileting using urinal, seated grooming/hygiene, and instruction in self range-of-motion exercises to prevent contracture at right UE in order to maintain use of R-UE as gross assist.   OT noted stiffness during supination and extension of wrist limited effective use and management of R-UE during grooming activities.   Patient completed urinating from w/c level using urinal with min assist to manage diaper and steadying assist during sit>stand while managing clothing.   Patient used w/c to access sink for hygiene with OT assisting placement of R-UE and providing supplies needed.   Session concluded with SROM instruction (demo and hand-over-hand guidance) for patient's R-UE from shoulders to fingers with literature provided.     Therapy Documentation Precautions:  Precautions Precautions: Fall Precaution Comments: right hemiparesis  Restrictions Weight Bearing Restrictions: No  Pain: Pain Assessment Pain Assessment: No/denies pain   See FIM for current functional status  Therapy/Group: Individual Therapy  Georgeanne Nim 03/21/2013, 3:31 PM

## 2013-03-22 ENCOUNTER — Inpatient Hospital Stay (HOSPITAL_COMMUNITY): Payer: Medicare Other | Admitting: Speech Pathology

## 2013-03-22 ENCOUNTER — Encounter (HOSPITAL_COMMUNITY): Payer: Medicare Other

## 2013-03-22 ENCOUNTER — Inpatient Hospital Stay (HOSPITAL_COMMUNITY): Payer: Medicare Other | Admitting: Physical Therapy

## 2013-03-22 DIAGNOSIS — I10 Essential (primary) hypertension: Secondary | ICD-10-CM

## 2013-03-22 DIAGNOSIS — I69991 Dysphagia following unspecified cerebrovascular disease: Secondary | ICD-10-CM

## 2013-03-22 DIAGNOSIS — I6330 Cerebral infarction due to thrombosis of unspecified cerebral artery: Secondary | ICD-10-CM

## 2013-03-22 DIAGNOSIS — G8110 Spastic hemiplegia affecting unspecified side: Secondary | ICD-10-CM

## 2013-03-22 NOTE — Progress Notes (Signed)
Physical Therapy Session Note  Patient Details  Name: Kevin Escobar MRN: [id number] Date of Birth: 1940-12-10  Today's Date: 03/22/2013 Time: 1300-1415 Time Calculation (min): 75 min  Short Term Goals: Week 4:  PT Short Term Goal 1 (Week 4): = LTG  Skilled Therapeutic Interventions/Progress Updates:   Wife present for family education.  Discussed equipment needs again; wife reports that she would like all equipment delivered to her house to make sure tub bench will fit correctly.  Alerted Child psychotherapist.  Pt performed stand pivot bed > w/c with min-mod A.  Performed real car transfer training outside.  Demonstrated and verbalized sequence to wife first and focused on w/c parts management and set up to prepare for transfer; pt performed stand pivot w/c <> car with UE support on top of car door with mod A to advance and place RLE and some lifting assistance to stand from w/c or car.  Wife gave repeat demonstration of full transfer from w/c parts management and set up to assisting pt with sit <> stand and pivot to/from car and placing RLE into and out of car.  Will continue to address to decreased assistance to min A.  Wife has not pursued ramp for home entry/exit.  Discussed with wife and patient that wife will need +2 A to bump w/c up/down 2 stairs outside to reach porch every time pt must leave or re-enter the house or he will have to negotiate both sets of stairs while wife manages w/c up/down stairs.  Demonstrated bumping sequence/method to wife who verbalized understanding.  Discussed again benefit of having permanent or temporary ramp outside to minimize number of stairs pt would have to negotiate, energy conservation and minimize risk of injury to patient and wife.  Will continue to practice.    Therapy Documentation Precautions:  Precautions Precautions: Fall Precaution Comments: right hemiparesis  Restrictions Weight Bearing Restrictions: No Vital Signs: Therapy Vitals BP: 128/64  mmHg Pain: Pain Assessment Pain Assessment: No/denies pain  See FIM for current functional status  Therapy/Group: Individual Therapy  [facility] 03/22/2013, 4:21 PM

## 2013-03-22 NOTE — Progress Notes (Signed)
Patient ID: Kevin Escobar, male   DOB: 1940-09-24, 72 y.o.   MRN: [id number] Subjective/Complaints: Slept soundly. Concerned about boil on back ROS  A 12 point review of systems has been performed and if not noted above is otherwise negative.   Objective: Vital Signs: Blood pressure 132/62, pulse 64, temperature 97.9 F (36.6 C), temperature source Oral, resp. rate 18, height 5' 10.87" (1.8 m), weight 67.405 kg (148 lb 9.6 oz), SpO2 96.00%.     Results for orders placed during the hospital encounter of 02/26/13 (from the past 72 hour(s))  GLUCOSE, CAPILLARY     Status: None   Collection Time    03/20/13  7:50 AM      Result Value Range   Glucose-Capillary 83  70 - 99 mg/dL     HEENT: Eyes not injected, oral mucosa moist, external ears normal Cardio: RRR and No murmur Resp: CTA B/L and unlabored GI: BS positive and Nontender Extremity:  Pulses positive and No Edema Skin:   Low thoracic back notable for sebaceous cyst which is red/tender, does not appear infected. + yellow thick drainage. Neuro: More Alert/Oriented, Flat, Cranial Nerve Abnormalities Right central 7, Normal Sensory, Abnormal Motor tr to 1/5 in the right upper except 1/5 R finger flexors.(trace tone at wrist,fingers) RLE tr in HF, KE. 1/5 distally. Equinovarus position of right foot. Tone:    Dysarthria continues Musc/Skel:  Normal General no acute distress   Assessment/Plan: 1. 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 Physiatrist is providing close team supervision and 24 hour management of active medical problems listed below. Physiatrist and rehab team continue to assess barriers to discharge/monitor patient progress toward functional and medical goals.  FIM: FIM - Bathing Bathing Steps Patient Completed: Chest;Right Arm;Abdomen;Front perineal area;Buttocks;Right upper leg;Left  upper leg;Left lower leg (including foot) Bathing: 4: Min-Patient completes 8-9 57f 10 parts or 75+ percent  FIM - Upper Body Dressing/Undressing Upper body dressing/undressing steps patient completed: Thread/unthread left sleeve of pullover shirt/dress;Put head through opening of pull over shirt/dress;Pull shirt over trunk Upper body dressing/undressing: 4: Min-Patient completed 75 plus % of tasks FIM - Lower Body Dressing/Undressing Lower body dressing/undressing steps patient completed: Thread/unthread right underwear leg;Thread/unthread left underwear leg;Pull underwear up/down;Thread/unthread right pants leg;Thread/unthread left pants leg;Don/Doff left shoe;Fasten/unfasten left shoe Lower body dressing/undressing: 3: Mod-Patient completed 50-74% of tasks  FIM - Toileting Toileting: 0: Activity did not occur  FIM - Diplomatic Services operational officer Devices: Psychiatrist Transfers: 0-Activity did not occur  FIM - Banker Devices: Bed rails Bed/Chair Transfer: 4: Chair or W/C > Bed: Min A (steadying Pt. > 75%)  FIM - Locomotion: Wheelchair Distance: 150 Locomotion: Wheelchair: 1: Total Assistance/staff pushes wheelchair (Pt<25%) FIM - Locomotion: Ambulation Locomotion: Ambulation Assistive Devices: Walker - Hemi;Orthosis Ambulation/Gait Assistance: 3: Mod assist Locomotion: Ambulation: 1: Travels less than 50 ft with moderate assistance (Pt: 50 - 74%)  Comprehension Comprehension Mode: Auditory Comprehension: 5-Follows basic conversation/direction: With extra time/assistive device  Expression Expression Mode: Verbal Expression: 3-Expresses basic 50 - 74% of the time/requires cueing 25 - 50% of the time. Needs to repeat parts of sentences.  Social Interaction Social Interaction Mode: Asleep Social Interaction: 4-Interacts appropriately 75 - 89% of the time - Needs redirection for appropriate language or to initiate  interaction.  Problem Solving Problem Solving Mode: Asleep Problem Solving: 5-Solves basic 90% of the time/requires cueing <  10% of the time  Memory Memory Mode: Asleep Memory: 5-Recognizes or recalls 90% of the time/requires cueing < 10% of the time   Medical Problem List and Plan:  1. DVT Prophylaxis/Anticoagulation: Pharmaceutical: Lovenox  2. Pain Management: will add scheduled hs tramadol for back pain. 3. Mood: Provide anxiety/mood support. Has a supportive family. On nicotine patch  -changed to lexapro 5mg   qpm 4. Neuropsych: This patient is capable of making decisions on his own behalf.  5. HTN: will monitor with bid checks. Continue metoprolol, norvasc and catapres. Off lasix due to renal insufficiency/dysphagia diet. Mainly systolic elevation. Clonidine  adjusted. improved 6. Reactive leucocytosis: Resolving.  7. Dyslipidemia: Continue lipitor.  8. Dysphagia: continue D2, nectar liquids. Continue to educate patient and family on compliance of current restrictions and aspiration risk.   9. Prostate cancer/bladder CA: H/o frequency. Toilet every 2-3 hours to help with continence. Condom cath at night.  10. CKD: Will monitor with routine checks. Currently at baseline.  11.  UTI --proteus and serratia-reviewed with pharmacy yesterday---changed to cipro to cover both 12. Sebaceous cyst on back- continue moist heat, express any fluid contained inside as possible, on abx  LOS (Days) 24 A FACE TO FACE EVALUATION WAS PERFORMED  KIRSTEINS,ANDREW E 03/22/2013, 8:52 AM

## 2013-03-22 NOTE — Progress Notes (Signed)
Speech Language Pathology Daily Session Note  Patient Details  Name: Kevin Escobar MRN: [id number] Date of Birth: [birth year]  Today's Date: 03/22/2013 Time: 1300-1415 Time Calculation (min): 75 min  Short Term Goals: Week 3: SLP Short Term Goal 1 (Week 3): The patient will perform oral motor, pharyngeal strengthening and diaphragmatic breathing exercises with Supervision cuing.  SLP Short Term Goal 2 (Week 3): Patient will utilize compensatory strategies to increase speech intelligibility at the phrase level with Min verbal cues.   SLP Short Term Goal 3 (Week 3): Patient will consume Dys.3 textures and thin liquids with minimal s/s of aspiration.  SLP Short Term Goal 4 (Week 3): Patient will self-monitor and correct wet vocal quality with Min verbal cues.  Skilled Therapeutic Interventions: Skilled treatment session focused on addressing dysarthria goals.  SLP facilitated session by placing patient in a noisy environment; as a result, he required Mod verbal cues to increase vocal intensity at the phrase-sentence level.  Patient stated that he could not just get louder and SLP reinforced that the coordination of vocalization at the onset of exhalation.   Continue with current plan of care.    FIM:  Comprehension Comprehension Mode: Auditory Comprehension: 6-Follows complex conversation/direction: With extra time/assistive device Expression Expression Mode: Verbal Expression: 3-Expresses basic 50 - 74% of the time/requires cueing 25 - 50% of the time. Needs to repeat parts of sentences. Social Interaction Social Interaction: 6-Interacts appropriately with others with medication or extra time (anti-anxiety, antidepressant). Problem Solving Problem Solving: 5-Solves complex 90% of the time/cues < 10% of the time Memory Memory: 6-More than reasonable amt of time  Pain Pain Assessment Pain Assessment: No/denies pain  Therapy/Group: Individual Therapy  Charlane Ferretti.,  CCC-SLP [phone]  Kevin Escobar 03/22/2013, 4:23 PM

## 2013-03-22 NOTE — Progress Notes (Signed)
Occupational Therapy Note  Patient Details  Name: Kevin Escobar MRN: [id number] Date of Birth: [birth year] Today's Date: 03/22/2013  Time In:  0900 Time Out: 0955.  Individual session patient stated that cyst on back was painful and RN notified.  Patient declined to rate.  Patient did not feel well and repeatedly stated "I don't know what is wrong - I am absolutely exhausted" but was VERY motivated to push himself to participate in OT session.  Patient did require extra assistance and rest breaks due to severe fatigue which patient stated was different than how he has felt the last few days.  Treatment focused on bed mobility, sitting balance, sit to stand, standing balance, incorporating RUE and RLE into functional activity, weightbearing and activation of RLE in standing, safety and increasing activity tolerance. BP was 122/75 which patient stated was " a little low for me."  RN notified of how patient is feeling today.  Offered to assist patient back to bed to rest before next therapy but patient stated he really wanted to try and sit up if possible. Call light and phone placed within reach.  RN with patient at end of OT session.   [name] 03/22/2013, 10:28 AM

## 2013-03-22 NOTE — Consult Note (Signed)
NEUROCOGNITIVE TESTING - [facility]   Mr. Kevin Escobar is a 72 year old man, who was previously seen for a neurocognitive status examination to assess his cognitive and emotional functioning post-stroke.  A brief mental status measure administered during that evaluation was suggestive of significant cognitive impairment, though more extensive neuropsychological screen was recommended to better determine the extent of his cognitive deficits.  Additionally, at that time, he endorsed symptoms consistent with mild-moderate depression and anxiety.    PROCEDURES: [2 units of 45409 on 03/22/2013]  The following tests were performed during today's visit: Repeatable Battery for the Assessment of Neuropsychological Status (RBANS, form A), Geriatric Anxiety Inventory, and the Geriatric Depression Scale (short form).  Test results are as follows:   RBANS Indices Scaled Score Percentile Description  Immediate Memory  87 19 Below Average  Visuospatial/Constructional 87 19 Below Average  Language 85 16 Below Average  Attention n/a n/a n/a  Delayed Memory 95 37 Average  Total Score n/a n/a n/a   RBANS Subtests Raw Score Percentile Description  List Learning 18 4 Impaired  Story Memory 19 66 Average  Figure Copy 14 2 Profoundly Impaired  Line Orientation 18 70 Average  Picture Naming 10 70 Average  Semantic Fluency 10 3 Impaired  Digit Span 8 16 Below Average  Coding n/a n/a n/a  List Recall 0 < 1 Profoundly Impaired  List Recognition 19 45 Average  Story Recall 11 82 Above Average  Figure recall 9 19 Below Average   GAI Raw Score = 7 Description = Minimal   GDS (short) Raw Score = 7 Description = Mild   Test results revealed impairments in immediate and delayed memory for rote information presented auditorially, as well as in semantic fluency.  Although impairment was also seen in complex visual organization, it is notable that he completed this task using  his non-dominant hand because of physical limitations secondary to stroke.  Therefore, his score was adversely affected by motor issues that are likely within the realm of what is normal.  Also of importance, a measure of complex attention was not administered, given his inability to use his dominant hand.  While not impaired, he demonstrated below average simple attention.  Kevin Escobar maintains intact abilities in areas of contextual auditory information, visuospatial abilities, confrontation naming, and visual memory.  From an emotional standpoint, he endorsed symptoms consistent with mild depressed mood and minimal anxiety on today's evaluation.    His overall neurocognitive profile is most consistent with a diagnosis of mild cognitive impairment (MCI), with deficits likely secondary to stroke.  Of note, his mood seems to be significantly improved from two weeks ago when his levels of depression and anxiety were in the moderate range.  He was reportedly started on an antidepressant since his last evaluation, which seems to be helping.  However, given continued presence of depression, it is possible that mood disruption is continuing to exacerbate his cognitive difficulties.  It is expected that his cognitive functioning will recover over time as he recovers from his stroke and as his mood disruption continues to resolve.    In light of these findings, the following recommendations are provided.    RECOMMENDATIONS:  Recommendations for treatment team:     Given his encoding deficits, when interacting with Kevin Escobar, directions and information should be provided in a simple, straight forward manner, and the treatment team should avoid giving multiple instructions simultaneously.    Kevin Escobar may also benefit from being provided with multiple  trials to learn new skills given the noted encoding inefficiencies.    Kevin Escobar memory was improved when information was framed within a context.  Those  interacting with him should strive to create a context, or story, to surround important details in order to improve his ability to recall the details later.     Kevin Escobar memory will also likely be aided with provision of written reminders.  Those around him can help him by writing down important information in order to help him keep track of it.     To the extent possible, multitasking should be avoided.   Performance will generally be best in a structured, routine, and familiar environment, as opposed to situations involving complex problems.   Recommendations for discharge planning:    Complete a comprehensive neuropsychological evaluation as an outpatient in 12 months to assess for interval change.  Per his request, Dr. Georjean Mode contact information should be included in his discharge paperwork for this purpose.     Maintain engagement in mentally, physically and cognitively stimulating activities.    Strive to maintain a healthy lifestyle (e.g., proper diet and exercise) in order to promote physical, cognitive and emotional health.   Leavy Cella, Psy.D.  Clinical Neuropsychologist

## 2013-03-22 NOTE — Progress Notes (Signed)
SLP Cancellation Note  Patient Details Name: Kevin Escobar MRN: [id number] DOB: 09/16/1940   Cancelled treatment:        Patient missed 30 minutes of skilled therapy with OT and SLP due to scheduling miscommunication and refusal to participate in group.    Fae Pippin, M.A., CCC-SLP [phone]  [name] 03/22/2013, 4:24 PM

## 2013-03-23 ENCOUNTER — Inpatient Hospital Stay (HOSPITAL_COMMUNITY): Payer: Medicare Other | Admitting: Speech Pathology

## 2013-03-23 ENCOUNTER — Inpatient Hospital Stay (HOSPITAL_COMMUNITY): Payer: Medicare Other | Admitting: *Deleted

## 2013-03-23 ENCOUNTER — Encounter (HOSPITAL_COMMUNITY): Payer: Medicare Other | Admitting: Speech Pathology

## 2013-03-23 ENCOUNTER — Inpatient Hospital Stay (HOSPITAL_COMMUNITY): Payer: Medicare Other | Admitting: Occupational Therapy

## 2013-03-23 ENCOUNTER — Inpatient Hospital Stay (HOSPITAL_COMMUNITY): Payer: Medicare Other | Admitting: Physical Therapy

## 2013-03-23 DIAGNOSIS — I6330 Cerebral infarction due to thrombosis of unspecified cerebral artery: Secondary | ICD-10-CM

## 2013-03-23 DIAGNOSIS — G8110 Spastic hemiplegia affecting unspecified side: Secondary | ICD-10-CM

## 2013-03-23 DIAGNOSIS — I10 Essential (primary) hypertension: Secondary | ICD-10-CM

## 2013-03-23 DIAGNOSIS — I69991 Dysphagia following unspecified cerebrovascular disease: Secondary | ICD-10-CM

## 2013-03-23 LAB — BASIC METABOLIC PANEL
BUN: 36 mg/dL — ABNORMAL HIGH (ref 6–23)
Chloride: 107 mEq/L (ref 96–112)
GFR calc non Af Amer: 21 mL/min — ABNORMAL LOW (ref >90)
Glucose, Bld: 86 mg/dL (ref 70–99)
Potassium: 3.7 mEq/L (ref 3.5–5.1)
Sodium: 139 mEq/L (ref 135–145)

## 2013-03-23 NOTE — Plan of Care (Signed)
Problem: RH BOWEL ELIMINATION Goal: RH STG MANAGE BOWEL WITH ASSISTANCE STG Manage Bowel with min Assistance.  Outcome: Not Progressing LBM 03-20-13 senna given awaiting further results

## 2013-03-23 NOTE — Progress Notes (Signed)
Patient ID: Kevin Escobar, male   DOB: 1940/09/27, 71 y.o.   MRN: [id number] Subjective/Complaints: Slept soundly.  boil on back feels better ROS  A 12 point review of systems has been performed and if not noted above is otherwise negative.   Objective: Vital Signs: Blood pressure 116/79, pulse 61, temperature 98.1 F (36.7 C), temperature source Oral, resp. rate 18, height 5' 10.87" (1.8 m), weight 67.405 kg (148 lb 9.6 oz), SpO2 96.00%.     Results for orders placed during the hospital encounter of 02/26/13 (from the past 72 hour(s))  BASIC METABOLIC PANEL     Status: Abnormal   Collection Time    03/23/13  6:00 AM      Result Value Range   Sodium 139  135 - 145 mEq/L   Potassium 3.7  3.5 - 5.1 mEq/L   Chloride 107  96 - 112 mEq/L   CO2 22  19 - 32 mEq/L   Glucose, Bld 86  70 - 99 mg/dL   BUN 36 (*) 6 - 23 mg/dL   Creatinine, Ser 4.09 (*) 0.50 - 1.35 mg/dL   Calcium 9.2  8.4 - 81.1 mg/dL   GFR calc non Af Amer 21 (*) >90 mL/min   GFR calc Af Amer 25 (*) >90 mL/min   Comment: (NOTE)     The eGFR has been calculated using the CKD EPI equation.     This calculation has not been validated in all clinical situations.     eGFR's persistently <90 mL/min signify possible Chronic Kidney     Disease.     HEENT: Eyes not injected, oral mucosa moist, external ears normal Cardio: RRR and No murmur Resp: CTA B/L and unlabored GI: BS positive and Nontender Extremity:  Pulses positive and No Edema Skin:   Low thoracic back notable for sebaceous cyst which is red/tender, does not appear infected. + yellow thick drainage. Neuro: More Alert/Oriented, Flat, Cranial Nerve Abnormalities Right central 7, Normal Sensory, Abnormal Motor tr to 1/5 in the right upper except 1/5 R finger flexors.(trace tone at wrist,fingers) RLE tr in HF, KE. 1/5 distally. Equinovarus position of right foot. Tone:    Dysarthria continues Musc/Skel:  Normal General no acute distress   Assessment/Plan: 1.  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 Physiatrist is providing close team supervision and 24 hour management of active medical problems listed below. Physiatrist and rehab team continue to assess barriers to discharge/monitor patient progress toward functional and medical goals.  FIM: FIM - Bathing Bathing Steps Patient Completed: Chest;Right Arm;Abdomen;Front perineal area;Buttocks;Right upper leg;Left upper leg;Right lower leg (including foot);Left lower leg (including foot) Bathing: 4: Min-Patient completes 8-9 72f 10 parts or 75+ percent  FIM - Upper Body Dressing/Undressing Upper body dressing/undressing steps patient completed: Thread/unthread right sleeve of pullover shirt/dresss;Thread/unthread left sleeve of pullover shirt/dress;Put head through opening of pull over shirt/dress;Pull shirt over trunk Upper body dressing/undressing: 5: Supervision: Safety issues/verbal cues FIM - Lower Body Dressing/Undressing Lower body dressing/undressing steps patient completed: Thread/unthread left underwear leg;Pull underwear up/down;Thread/unthread left pants leg;Pull pants up/down;Fasten/unfasten left shoe;Don/Doff left shoe Lower body dressing/undressing: 3: Mod-Patient completed 50-74% of tasks  FIM - Toileting Toileting: 0: Activity did not occur  FIM - Diplomatic Services operational officer Devices: Psychiatrist Transfers: 0-Activity did not occur  FIM - Banker Devices: Arm rests Bed/Chair Transfer: 3: Bed > Chair  or W/C: Mod A (lift or lower assist);3: Chair or W/C > Bed: Mod A (lift or lower assist)  FIM - Locomotion: Wheelchair Distance: 150 Locomotion: Wheelchair: 1: Total Assistance/staff pushes wheelchair (Pt<25%) FIM - Locomotion: Ambulation Locomotion: Ambulation Assistive Devices: Walker -  Hemi;Orthosis Ambulation/Gait Assistance: 3: Mod assist Locomotion: Ambulation: 0: Activity did not occur  Comprehension Comprehension Mode: Auditory Comprehension: 6-Follows complex conversation/direction: With extra time/assistive device  Expression Expression Mode: Verbal Expression: 3-Expresses basic 50 - 74% of the time/requires cueing 25 - 50% of the time. Needs to repeat parts of sentences.  Social Interaction Social Interaction Mode: Asleep Social Interaction: 5-Interacts appropriately 90% of the time - Needs monitoring or encouragement for participation or interaction.  Problem Solving Problem Solving Mode: Asleep Problem Solving: 5-Solves basic 90% of the time/requires cueing < 10% of the time  Memory Memory Mode: Asleep Memory: 5-Recognizes or recalls 90% of the time/requires cueing < 10% of the time   Medical Problem List and Plan:  1. DVT Prophylaxis/Anticoagulation: Pharmaceutical: Lovenox  2. Pain Management: will add scheduled hs tramadol for back pain. 3. Mood: Provide anxiety/mood support. Has a supportive family. On nicotine patch  -changed to lexapro 5mg   qpm 4. Neuropsych: This patient is capable of making decisions on his own behalf.  5. HTN: will monitor with bid checks. Continue metoprolol, norvasc and catapres. Off lasix due to renal insufficiency/dysphagia diet. Mainly systolic elevation. Clonidine  adjusted. improved 6. Reactive leucocytosis: Resolving.  7. Dyslipidemia: Continue lipitor.  8. Dysphagia: continue D2, nectar liquids. Continue to educate patient and family on compliance of current restrictions and aspiration risk.   9. Prostate cancer/bladder CA: H/o frequency. Toilet every 2-3 hours to help with continence. Condom cath at night.  10. CKD: Will monitor with routine checks. Currently at baseline.  11.  UTI --proteus and serratia changed to cipro to cover both 12. Sebaceous cyst on back- continue moist heat, express any fluid contained  inside as possible, on abx, improving  LOS (Days) 25 A FACE TO FACE EVALUATION WAS PERFORMED  KIRSTEINS,ANDREW E 03/23/2013, 3:40 PM

## 2013-03-23 NOTE — Progress Notes (Signed)
Speech Language Pathology Daily Session Note & Weekly Progress Note  Patient Details  Name: Kevin Escobar MRN: [id number] Date of Birth: 1941/03/27  Today's Date: 03/23/2013 Time: 1405-1430 Time Calculation (min): 25 min  Short Term Goals: Week 3: SLP Short Term Goal 1 (Week 3): The patient will perform oral motor, pharyngeal strengthening and diaphragmatic breathing exercises with Supervision cuing.  SLP Short Term Goal 2 (Week 3): Patient will utilize compensatory strategies to increase speech intelligibility at the phrase level with Min verbal cues.   SLP Short Term Goal 3 (Week 3): Patient will consume Dys.3 textures and thin liquids with minimal s/s of aspiration.  SLP Short Term Goal 4 (Week 3): Patient will self-monitor and correct wet vocal quality with Min verbal cues.  Skilled Therapeutic Interventions: Skilled treatment session focused on addressing dysarthria goals.  SLP provided demonstration and a structured practice that reinforced the coordination of vocalization at the onset of exhalation.   SLP also facilitated session by placing patient in an environment with mild household noise; as a result, patient required fewer cues today, Min verbal cues to increase vocal intensity at the phrase level.  Continue with current plan of care.   FIM:  Comprehension Comprehension Mode: Auditory Comprehension: 6-Follows complex conversation/direction: With extra time/assistive device Expression Expression Mode: Verbal Expression: 4-Expresses basic 75 - 89% of the time/requires cueing 10 - 24% of the time. Needs helper to occlude trach/needs to repeat words. Social Interaction Social Interaction: 5-Interacts appropriately 90% of the time - Needs monitoring or encouragement for participation or interaction. Problem Solving Problem Solving: 5-Solves basic 90% of the time/requires cueing < 10% of the time Memory Memory: 5-Recognizes or recalls 90% of the time/requires cueing < 10% of the  time  Pain Pain Assessment Pain Assessment: No/denies pain  Therapy/Group: Individual Therapy   Speech Language Pathology Weekly Progress Note  Patient Details  Name: Kevin Escobar MRN: [id number] Date of Birth: 03-19-1941  Today's Date: 03/23/2013  Short Term Goals: Week 3: SLP Short Term Goal 1 (Week 3): The patient will perform oral motor, pharyngeal strengthening and diaphragmatic breathing exercises with Supervision cuing.  SLP Short Term Goal 1 - Progress (Week 3): Partly met SLP Short Term Goal 2 (Week 3): Patient will utilize compensatory strategies to increase speech intelligibility at the phrase level with Min verbal cues.   SLP Short Term Goal 2 - Progress (Week 3): Partly met SLP Short Term Goal 3 (Week 3): Patient will consume Dys.3 textures and thin liquids with minimal s/s of aspiration.  SLP Short Term Goal 3 - Progress (Week 3): Met SLP Short Term Goal 4 (Week 3): Patient will self-monitor and correct wet vocal quality with Min verbal cues. SLP Short Term Goal 4 - Progress (Week 3): Met Week 4: SLP Short Term Goal 1 (Week 4): The patient will perform diaphragmatic breathing exercises with Supervision cuing.  SLP Short Term Goal 2 (Week 4): Patient will utilize compensatory strategies to increase speech intelligibility at the phrase level with Min verbal cues.   SLP Short Term Goal 3 (Week 4): Patient will self-monitor vocal intensity with Mod verbal and non-verbal cues.   Weekly Progress Updates: Patient met 2 out of 4 short term objectives this reporting period due to diet advancement and toleration.  Patient is consuming regular textures and thin liquids with minimal s/s of aspiration at a Mod I level.  Patient is also demonstrating improved monitoring and management of wet vocal quality.  Patient has partially met his other 2  short term objectives but due to his level of fatigue he is unable to consistently perform at a Supervision-Min assist level as a result,  those goals are being continued.  This patient continues to require skilled SLP services to address these deficits, complete family education and reduce burden of care prior to discharge home with wife the end of this week.  SLP Intensity: Minumum of 1-2 x/day, 30 to 90 minutes SLP Frequency: 5 out of 7 days SLP Duration/Estimated Length of Stay: 8/22 SLP Treatment/Interventions: Cueing hierarchy;Dysphagia/aspiration precaution training;Functional tasks;Internal/external aids;Patient/family education;Therapeutic Activities;Therapeutic Exercise;Speech/Language facilitation  Charlane Ferretti., CCC-SLP [phone]  Faithanne Verret 03/23/2013, 4:15 PM

## 2013-03-23 NOTE — Progress Notes (Signed)
Occupational Therapy Session Note  Patient Details  Name: Kevin Escobar MRN: [id number] Date of Birth: 1941-06-06  Today's Date: 03/23/2013 Time: 7829-5621 Time Calculation (min): 60 min  Short Term Goals: Week 4:  OT Short Term Goal 1 (Week 4): Pt will wash BLE including feet with steady assist OT Short Term Goal 2 (Week 4): Pt will complete bathing with min assist OT Short Term Goal 3 (Week 4): Pt will complete toilet transfer with min assist OT Short Term Goal 4 (Week 4): Pt will complete LB dressing with min assist  Skilled Therapeutic Interventions/Progress Updates:    Pt seen for ADL retraining and family education with wife with focus on bed mobility, tub/shower transfer, hemi-technique with bathing and dressing, and use of AE to assist with LB bathing and dressing.  Min assist bed mobility without bed rails and cues for hand placement to increase participation.  Pt performed stand pivot with transfer bed > w/c and required increased assist with transfer, as opposed to normal squat pivot.  Discussed difference in assistance from squat pivot vs. stand pivot and safety.  Performed bathing at sit <> stand level in tub/shower with use of grab bar for steadying and cues to straighten RLE in standing.  Educated pt's wife on safety with mobility and allowing him to attempt bathing and dressing tasks prior to jumping in to assist him.  Discussed AE with recommending tub bench, grab bar(s), and hand held shower head.  Educated on use of reacher for LB dressing which proved to be increasingly complicated - pt's wife reports she has assist with steadying and lifting RLE to assist in LB dressing.  Pt's wife mostly observed today's session, she is to attend session tomorrow to perform transfers and provide cues/assistance with dressing.    Therapy Documentation Precautions:  Precautions Precautions: Fall Precaution Comments: right hemiparesis  Restrictions Weight Bearing Restrictions:  No General:   Vital Signs: Therapy Vitals Pulse Rate: 61 BP: 148/65 mmHg Pain: Pain Assessment Pain Assessment: No/denies pain Pain Score: 0-No pain  See FIM for current functional status  Therapy/Group: Individual Therapy  Rosalio Loud 03/23/2013, 11:16 AM

## 2013-03-23 NOTE — Progress Notes (Signed)
Physical Therapy Session Note  Patient Details  Name: Kevin Escobar MRN: [id number] Date of Birth: [birth year]  Today's Date: 03/23/2013 Time: 1300-1400 Time Calculation (min): 60 min  Short Term Goals: Week 4:  PT Short Term Goal 1 (Week 4): = LTG  Skilled Therapeutic Interventions/Progress Updates:   Continued family education with wife present with focus on stair negotiation today.  Had patient perform stand pivot transfer w/c > transport w/c with mod A to stand and pivot safely.  In transport w/c demonstrated to wife how to perform w/c bump up/down one step in transport w/c and discussed safety with tipping, foot clearance and use of LE and hips to push w/c up one step instead of back and UE.  Wife gave repeat demonstration but required verbal cues and close supervision to perform safely.  Also demonstrated to wife how to perform up/down 2-3 stairs with L rail forwards to ascend and laterally to descend with LLE leading to ascend and descend to access porch and then with R rail going laterally to ascend and forwards to descend with L foot leading to ascend and R foot to descend.  Wife gave repeat demonstration of both sequences but required verbal and hands on assistance secondary to fatigue of patient.  Discussed again energy conservation and safety with stairs; therapist continues to recommend ramp to access porch.  After each set of stairs pt required sitting rest break and presented with SOB.  Will continue to practice and discuss with brother tomorrow when he is present.  Also continued to educate pt and wife on RUE positioning while in w/c for optimal R shoulder position to minimize subluxation and prevent injury to shoulder muscles as well as positioning for elbow, wrist and fingers to prevent contracture and allow wrist extension and finger extension.  Encouraged pt to use half lap tray for positioning.  Pt and wife verbalized agreement.  Therapy Documentation Precautions:   Precautions Precautions: Fall Precaution Comments: right hemiparesis  Restrictions Weight Bearing Restrictions: No Pain: Pain Assessment Pain Assessment: No/denies pain   See FIM for current functional status  Therapy/Group: Individual Therapy  Edman Circle [facility] 03/23/2013, 2:12 PM

## 2013-03-23 NOTE — Progress Notes (Signed)
Social Work Patient ID: Kevin Escobar, male   DOB: 04-01-1941, 72 y.o.   MRN: [id number]  CSW met with pt and pt's wife yesterday and today to discuss home equipment needs.  Pt's wife has decided to purchase some items rather than rent them, as pt may need them longer term and it makes better financial sense to purchase.  Pt to borrow 3-in-1 from his brother.  CSW ordered equipment through [facility] and they will deliver to the room to assure that everything is correct for pt.  CSW will arrange for [facility] before pt is d/c'd.  Pt and wife feel comfortable with plan for pt to return home, but wife is aware she can still have pt to go a SNF within 30 days of d/c if they get home and pt is too much care for her.  Wife is confident she can provide care and her dtr is coming to assist on Saturday for about a week.  CSW will continue to follow and assist with d/c plan, scheduled for 03-26-13.

## 2013-03-23 NOTE — Progress Notes (Signed)
Orthopedic Tech Progress Note Patient Details:  Kevin Escobar 04-Mar-1941 [id number]  Patient ID: [name], male   DOB: [birth year], 72 y.o.   MRN: [id number]   [name] 03/23/2013, 3:51 PMCalled advanced for right AFO.

## 2013-03-23 NOTE — Progress Notes (Signed)
Physical Therapy Session Note  Patient Details  Name: Kevin Escobar MRN: [id number] Date of Birth: 1941/04/03  Today's Date: 03/23/2013 Time: 1030-1100 Time Calculation (min): 30 min   Skilled Therapeutic Interventions/Progress Updates:    Dynamic balance, weight shifting and kicking target with min assist, AFO, and hemi-walker. Stairs x 4 (seated rest needed between each bout of 2) practicing simulated home set-up. Practiced with railing on Lt. Ascending forwards, descending sideways then railing on Rt. Ascending sideways and descending forwards. Pt needs cues for sequencing and safety and assist for placement of Rt. LE. He requires moderate assist particularly for Rt. LE weakness.    Therapy Documentation Precautions:  Precautions Precautions: Fall Precaution Comments: right hemiparesis  Restrictions Weight Bearing Restrictions: No Pain: Pain Assessment Pain Assessment: No/denies pain Pain Score: 0-No pain  See FIM for current functional status  Therapy/Group: Individual Therapy  Wilhemina Bonito 03/23/2013, 12:09 PM

## 2013-03-24 ENCOUNTER — Inpatient Hospital Stay (HOSPITAL_COMMUNITY): Payer: Medicare Other

## 2013-03-24 ENCOUNTER — Inpatient Hospital Stay (HOSPITAL_COMMUNITY): Payer: Medicare Other | Admitting: Physical Therapy

## 2013-03-24 ENCOUNTER — Inpatient Hospital Stay (HOSPITAL_COMMUNITY): Payer: Medicare Other | Admitting: Occupational Therapy

## 2013-03-24 NOTE — Progress Notes (Signed)
Physical Therapy Session Note  Patient Details  Name: Kevin Escobar MRN: [id number] Date of Birth: 05/19/1941  Today's Date: 03/24/2013 Time: 1130-1200 and 4098-1191 Time Calculation (min): 30 min and 55 min  Short Term Goals: Week 4:  PT Short Term Goal 1 (Week 4): = LTG  Skilled Therapeutic Interventions/Progress Updates:   Pt reporting needing assistance to use urinal.  Pt attempting to propel w/c with R lateral lean and leaning on RUE in w/c and RLE dragging on floor.  Required verbal cues to reposition R foot on foot plate and RUE on lap tray for improved ability to propel w/c in room/narrow spaces.  Required max A to stand to doff pants and position urinal.  Pt used urinal in sitting.  Required mod-max A to stand with UE support on hemi walker to remove urinal and don pants.  P&O present for orthosis consultation.  Pt performed gait with hemi walker and blue rocker with heel wedge x 20' with mod A for upright trunk, L lateral weight shift and full clearance of R foot.  Performed gait again without AFO donned to assess likelihood of knee buckling in WB.  P&O and therapist discussed addition of toe cap to R shoe to prevent catching on floor and less stiff Allard brace with heel wedge in R shoe for DF assistance and to continue to allow some tibial translation for stairs, sit <> stand and squat pivots secondary to minimal buckling.  Will return with shoe and brace tomorrow.  Pt returned to room and set up with lunch.     PM session:  Family education session with brother and wife with focus on stair negotiation training with rail and bumping w/c up/down one step and ramp.  Brother reports installing ramp inside of the home.  Therapist continues to recommend ramp to be built outside as well.  Demonstrated stair sequence technique for outside with L rail up/down 3 stairs to brother.  Wife gave repeat demonstration with patient with wife providing mod A to maintain R knee stability and advance RLE  when ascending forwards and descending laterally.  Brother also gave repeat demonstration providing max A secondary to patient fatigue.  Brother feels they will be able to get him in the house the first time and he states he will always be there when the patient must leave the house.  Also discuss w/c positioning at top and bottom of stairs and depth of porch and best chair to have at top of stairs to prepare for bumping into house.  Secondary to short depth of porch pt will likely need to be in transport w/c.  Demonstrated and had wife and brother repeat demonstration of bumping w/c up/down on step in manual w/c and transport w/c to feel the difference.  Also had wife practice pushing pt in w/c up/down steep ramp forwards to ascend and backwards to descend.  Also discussed other home modifications that could be done to improve safety and functional mobility independence: bed rail that fits between mattresses.Will continue education with wife tomorrow.      Therapy Documentation Precautions:  Precautions Precautions: Fall Precaution Comments: right hemiparesis  Restrictions Weight Bearing Restrictions: No Pain: Pain Assessment Pain Assessment: No/denies pain Locomotion : Ambulation Ambulation/Gait Assistance: 3: Mod assist   See FIM for current functional status  Therapy/Group: Individual Therapy  Edman Circle [facility] 03/24/2013, 12:24 PM

## 2013-03-24 NOTE — Progress Notes (Signed)
Patient ID: Kevin Escobar, male   DOB: 1940-10-27, 72 y.o.   MRN: [id number] Subjective/Complaints: Slept well but feels tired today ROS  A 12 point review of systems has been performed and if not noted above is otherwise negative.   Objective: Vital Signs: Blood pressure 132/61, pulse 59, temperature 97.9 F (36.6 C), temperature source Oral, resp. rate 18, height 5' 10.87" (1.8 m), weight 67.405 kg (148 lb 9.6 oz), SpO2 97.00%.     Results for orders placed during the hospital encounter of 02/26/13 (from the past 72 hour(s))  BASIC METABOLIC PANEL     Status: Abnormal   Collection Time    03/23/13  6:00 AM      Result Value Range   Sodium 139  135 - 145 mEq/L   Potassium 3.7  3.5 - 5.1 mEq/L   Chloride 107  96 - 112 mEq/L   CO2 22  19 - 32 mEq/L   Glucose, Bld 86  70 - 99 mg/dL   BUN 36 (*) 6 - 23 mg/dL   Creatinine, Ser 4.09 (*) 0.50 - 1.35 mg/dL   Calcium 9.2  8.4 - 81.1 mg/dL   GFR calc non Af Amer 21 (*) >90 mL/min   GFR calc Af Amer 25 (*) >90 mL/min   Comment: (NOTE)     The eGFR has been calculated using the CKD EPI equation.     This calculation has not been validated in all clinical situations.     eGFR's persistently <90 mL/min signify possible Chronic Kidney     Disease.     HEENT: Eyes not injected, oral mucosa moist, external ears normal Cardio: RRR and No murmur Resp: CTA B/L and unlabored GI: BS positive and Nontender Extremity:  Pulses positive and No Edema Skin:   Low thoracic back notable for sebaceous cyst which is red/tender, does not appear infected. + yellow thick drainage. Neuro: More Alert/Oriented, Flat, Cranial Nerve Abnormalities Right central 7, Normal Sensory, Abnormal Motor tr to 1/5 in the right upper except 1/5 R finger flexors.(trace tone at wrist,fingers) RLE tr in HF, KE. 1/5 distally. Equinovarus position of right foot. Tone:    Dysarthria continues Musc/Skel:  Normal General no acute distress   Assessment/Plan: 1. Functional  deficits secondary to Left PLIC infarct right hemiplegia which require 3+ hours per day of interdisciplinary therapy in a comprehensive inpatient rehab setting. Physiatrist is providing close team supervision and 24 hour management of active medical problems listed below. Physiatrist and rehab team continue to assess barriers to discharge/monitor patient progress toward functional and medical goals. Team conference today please see physician documentation under team conference tab, met with team face-to-face to discuss problems,progress, and goals. Formulized individual treatment plan based on medical history, underlying problem and comorbidities. FIM: FIM - Bathing Bathing Steps Patient Completed: Chest;Right Arm;Abdomen;Front perineal area;Buttocks;Right upper leg;Left upper leg;Right lower leg (including foot);Left lower leg (including foot) Bathing: 4: Min-Patient completes 8-9 72f 10 parts or 75+ percent  FIM - Upper Body Dressing/Undressing Upper body dressing/undressing steps patient completed: Thread/unthread right sleeve of pullover shirt/dresss;Thread/unthread left sleeve of pullover shirt/dress;Put head through opening of pull over shirt/dress;Pull shirt over trunk Upper body dressing/undressing: 5: Supervision: Safety issues/verbal cues FIM - Lower Body Dressing/Undressing Lower body dressing/undressing steps patient completed: Thread/unthread left underwear leg;Pull underwear up/down;Thread/unthread left pants leg;Fasten/unfasten left shoe;Don/Doff left shoe;Pull pants up/down Lower body dressing/undressing: 3: Mod-Patient completed 50-74% of tasks  FIM - Toileting Toileting: 0: Activity did not occur  FIM - Toilet Transfers  Museum/gallery curator Devices: Psychiatrist Transfers: 3-To toilet/BSC: Mod A (lift or lower assist);3-From toilet/BSC: Mod A (lift or lower assist)  FIM - Bed/Chair Transfer Bed/Chair Transfer Assistive Devices: Arm  rests;Walker;Orthosis Bed/Chair Transfer: 2: Bed > Chair or W/C: Max A (lift and lower assist);2: Chair or W/C > Bed: Max A (lift and lower assist)  FIM - Locomotion: Wheelchair Distance: 150 Locomotion: Wheelchair: 1: Total Assistance/staff pushes wheelchair (Pt<25%) FIM - Locomotion: Ambulation Locomotion: Ambulation Assistive Devices: Walker - Hemi;Orthosis Ambulation/Gait Assistance: 3: Mod assist Locomotion: Ambulation: 1: Travels less than 50 ft with moderate assistance (Pt: 50 - 74%)  Comprehension Comprehension Mode: Auditory Comprehension: 6-Follows complex conversation/direction: With extra time/assistive device  Expression Expression Mode: Verbal Expression: 4-Expresses basic 75 - 89% of the time/requires cueing 10 - 24% of the time. Needs helper to occlude trach/needs to repeat words.  Social Interaction Social Interaction Mode: Asleep Social Interaction: 5-Interacts appropriately 90% of the time - Needs monitoring or encouragement for participation or interaction.  Problem Solving Problem Solving Mode: Asleep Problem Solving: 5-Solves basic 90% of the time/requires cueing < 10% of the time  Memory Memory Mode: Asleep Memory: 5-Recognizes or recalls 90% of the time/requires cueing < 10% of the time   Medical Problem List and Plan:  1. DVT Prophylaxis/Anticoagulation: Pharmaceutical: Lovenox  2. Pain Management: will add scheduled hs tramadol for back pain. 3. Mood: Provide anxiety/mood support. Has a supportive family. On nicotine patch  -changed to lexapro 5mg   qpm 4. Neuropsych: This patient is capable of making decisions on his own behalf.  5. HTN: will monitor with bid checks. Continue metoprolol, norvasc and catapres. Off lasix due to renal insufficiency/dysphagia diet. Mainly systolic elevation. Clonidine  adjusted. improved 6. Reactive leucocytosis: Resolving.  7. Dyslipidemia: Continue lipitor.  8. Dysphagia: continue D2, nectar liquids. Continue to educate  patient and family on compliance of current restrictions and aspiration risk.   9. Prostate cancer/bladder CA: H/o frequency. Toilet every 2-3 hours to help with continence. Condom cath at night.  10. CKD: Will monitor with routine checks. Currently at baseline.  11.  UTI --proteus and serratia changed to cipro to cover both 12. Sebaceous cyst on back- continue moist heat, express any fluid contained inside as possible, on abx, improving  LOS (Days) 26 A FACE TO FACE EVALUATION WAS PERFORMED  KIRSTEINS,ANDREW E 03/24/2013, 12:28 PM

## 2013-03-24 NOTE — Progress Notes (Signed)
Social Work Patient ID: Kevin Escobar, male   DOB: [birth year], 72 y.o.   MRN: [id number]  CSW met with pt, pt's wife, and pt's brother to update them on team conference.  Pt made aware that he could d/c on Saturday if that would be better with more family available, but he wants to keep 03-26-13 date and brother confirmed that was good, as he is retired and has the time to help pt.  Pt's family is participating in family education.  Pt is aware that he will be using w/c for a while. Pt feels comfortable with his family's help.  CSW reminded him that he will still need to work hard with home therapists.  CSW to set that up for pt.  Answered pt's wife's questions.  CSW to continue to assist with pt's d/c home.

## 2013-03-24 NOTE — Progress Notes (Signed)
Social Work Patient ID: [name], male   DOB: [birth year], 72 y.o.   MRN: [id number]  [name], LCSW Social Worker Signed  Patient Care Conference Service date: 03/24/2013 3:03 PM  Inpatient RehabilitationTeam Conference and Plan of Care Update Date: 03/24/2013   Time: 10:55 AM     Patient Name: Kevin Escobar       Medical Record Number: [id number]   Date of Birth: 17-Feb-1941 Sex: Male         Room/Bed: 4W07C/4W07C-01 Payor Info: Payor: MEDICARE / Plan: MEDICARE PART A AND B / Product Type: *No Product type* /   Admitting Diagnosis: L CVA   Admit Date/Time:  02/26/2013  4:57 PM Admission Comments: No comment available   Primary Diagnosis:  CVA (cerebral vascular accident) Principal Problem: CVA (cerebral vascular accident)    Patient Active Problem List     Diagnosis  Date Noted   .  Other emphysema  03/03/2013   .  Hypokalemia  02/22/2013   .  Depression  02/21/2013   .  PVD (peripheral vascular disease)  02/21/2013   .  CVA (cerebral vascular accident)  02/20/2013   .  Chronic kidney disease     .  Hypertension     .  TIA (transient ischemic attack)     .  Peripheral vascular disease       Expected Discharge Date: Expected Discharge Date: 03/26/13  Team Members Present: Physician leading conference: Dr. Claudette Laws Social Worker Present: Staci Acosta, LCSW Nurse Present: Gregor Hams, RN PT Present: Harriet Butte, Varney Biles, PT OT Present: Rosalio Loud, Heath Lark, OT SLP Present: Fae Pippin, SLP        Current Status/Progress  Goal  Weekly Team Focus   Medical     UTI on antibiotics, sleep improved, poor endurance  Discharge for this week  Family training, adjust medications   Bowel/Bladder     Incontinent of bowel at times, LBM 03/20/13, incontinent of bladder at times with timed toileting during the day and condom cath at night, being treated with cipro for UTI  Continent of bowel, manage bladder with timed toileting   Continue timed toileting   Swallow/Nutrition/ Hydration     Regular textures and thin liquids with intermittnet supervision and set-up  least restrictive p.o. intake  goals met   ADL's     min assist bathing, supervision UB drssing, mod assist LB dressing, min-mod assist trnafers, min-mod assist sit <> stand  min assist overall. mod assist LB dressing  transfers, sit <> stand, standing tolerance, dynamic sitting and standing balance, RUE NM re-ed   Mobility     Min A bed mobility, min-mod A for basic transfers, gait, stairs, supervision w/c mobility  Min A overall  Family education and training, D/C planning   Communication     Min-Mod assist  Min assist  continue to increase breath support and coordination of speech onset with exhalation    Safety/Cognition/ Behavioral Observations    WFL with basic        Pain     Pain in right wrist and right ankle with volaten gel in use  Pain </=3  Scheduled voltaren gel and pain medication as needed   Skin     Pink cyst to left lower back with warm compresses in use; dry skin  Continue warm compresses  No new skin breakdown    Rehab Goals Patient on target to meet rehab goals: Yes Rehab Goals Revised: none *See Care  Plan and progress notes for long and short-term goals.    Barriers to Discharge:  Neurologic deficits, endurance, wife has difficulty transferring patient      Possible Resolutions to Barriers:    Continued therapy program, see above      Discharge Planning/Teaching Needs:    Pt/family plan for pt to go home to care of pt's wife and extended family-dtrs and brother.  Pt's wife and brother have been participating in therapies.  Pt's brother is assisting with home modifications.  CSW has ordered equipment and will arrange [facility] and MD f/u appt.   Pt's family is receiving family education.    Team Discussion:    Pt continues to complain of fatigue.  Pt shows some anxiety with family education, as does pt's wife, at times.  Pt is at  times mod-max assist, especially at night.  Therapists are recommending a squat pivot for pt/family.  Pt will be w/c level mostly.  Discharge could be moved to Saturday if more family is available that day.   Revisions to Treatment Plan:    Pt will be mostly w/c level at home.    Continued Need for Acute Rehabilitation Level of Care: The patient requires daily medical management by a physician with specialized training in physical medicine and rehabilitation for the following conditions: Daily direction of a multidisciplinary physical rehabilitation program to ensure safe treatment while eliciting the highest outcome that is of practical value to the patient.: Yes Daily medical management of patient stability for increased activity during participation in an intensive rehabilitation regime.: Yes Daily analysis of laboratory values and/or radiology reports with any subsequent need for medication adjustment of medical intervention for : Neurological problems;Other  [name], Vista Deck 03/24/2013, 3:03 PM

## 2013-03-24 NOTE — Plan of Care (Signed)
Problem: RH BOWEL ELIMINATION Goal: RH STG MANAGE BOWEL WITH ASSISTANCE STG Manage Bowel with min Assistance.  Outcome: Not Progressing LBM 03/20/13, declined laxative x 2

## 2013-03-24 NOTE — Patient Care Conference (Signed)
Inpatient RehabilitationTeam Conference and Plan of Care Update Date: 03/24/2013   Time: 10:55 AM    Patient Name: Kevin Escobar      Medical Record Number: [id number]  Date of Birth: 12-Feb-1941 Sex: Male         Room/Bed: 4W07C/4W07C-01 Payor Info: Payor: MEDICARE / Plan: MEDICARE PART A AND B / Product Type: *No Product type* /    Admitting Diagnosis: L CVA  Admit Date/Time:  02/26/2013  4:57 PM Admission Comments: No comment available   Primary Diagnosis:  CVA (cerebral vascular accident) Principal Problem: CVA (cerebral vascular accident)  Patient Active Problem List   Diagnosis Date Noted  . Other emphysema 03/03/2013  . Hypokalemia 02/22/2013  . Depression 02/21/2013  . PVD (peripheral vascular disease) 02/21/2013  . CVA (cerebral vascular accident) 02/20/2013  . Chronic kidney disease   . Hypertension   . TIA (transient ischemic attack)   . Peripheral vascular disease     Expected Discharge Date: Expected Discharge Date: 03/26/13  Team Members Present: Physician leading conference: Dr. Claudette Laws Social Worker Present: Staci Acosta, LCSW Nurse Present: Gregor Hams, RN PT Present: Harriet Butte, Varney Biles, PT OT Present: Rosalio Loud, Heath Lark, OT SLP Present: Fae Pippin, SLP     Current Status/Progress Goal Weekly Team Focus  Medical   UTI on antibiotics, sleep improved, poor endurance  Discharge for this week  Family training, adjust medications   Bowel/Bladder   Incontinent of bowel at times, LBM 03/20/13, incontinent of bladder at times with timed toileting during the day and condom cath at night, being treated with cipro for UTI  Continent of bowel, manage bladder with timed toileting  Continue timed toileting   Swallow/Nutrition/ Hydration   Regular textures and thin liquids with intermittnet supervision and set-up  least restrictive p.o. intake  goals met   ADL's   min assist bathing, supervision UB drssing, mod assist LB  dressing, min-mod assist trnafers, min-mod assist sit <> stand  min assist overall. mod assist LB dressing  transfers, sit <> stand, standing tolerance, dynamic sitting and standing balance, RUE NM re-ed   Mobility   Min A bed mobility, min-mod A for basic transfers, gait, stairs, supervision w/c mobility  Min A overall  Family education and training, D/C planning   Communication   Min-Mod assist  Min assist  continue to increase breath support and coordination of speech onset with exhalation    Safety/Cognition/ Behavioral Observations  WFL with basic          Pain   Pain in right wrist and right ankle with volaten gel in use  Pain </=3  Scheduled voltaren gel and pain medication as needed   Skin   Pink cyst to left lower back with warm compresses in use; dry skin  Continue warm compresses  No new skin breakdown    Rehab Goals Patient on target to meet rehab goals: Yes Rehab Goals Revised: none *See Care Plan and progress notes for long and short-term goals.  Barriers to Discharge: Neurologic deficits, endurance, wife has difficulty transferring patient    Possible Resolutions to Barriers:  Continued therapy program, see above    Discharge Planning/Teaching Needs:  Pt/family plan for pt to go home to care of pt's wife and extended family-dtrs and brother.  Pt's wife and brother have been participating in therapies.  Pt's brother is assisting with home modifications.  CSW has ordered equipment and will arrange [facility] and MD f/u appt.  Pt's family is receiving  family education.   Team Discussion:  Pt continues to complain of fatigue.  Pt shows some anxiety with family education, as does pt's wife, at times.  Pt is at times mod-max assist, especially at night.  Therapists are recommending a squat pivot for pt/family.  Pt will be w/c level mostly.  Discharge could be moved to Saturday if more family is available that day.  Revisions to Treatment Plan:  Pt will be mostly w/c level at home.    Continued Need for Acute Rehabilitation Level of Care: The patient requires daily medical management by a physician with specialized training in physical medicine and rehabilitation for the following conditions: Daily direction of a multidisciplinary physical rehabilitation program to ensure safe treatment while eliciting the highest outcome that is of practical value to the patient.: Yes Daily medical management of patient stability for increased activity during participation in an intensive rehabilitation regime.: Yes Daily analysis of laboratory values and/or radiology reports with any subsequent need for medication adjustment of medical intervention for : Neurological problems;Other  Kevin Escobar, Kevin Escobar 03/24/2013, 3:03 PM

## 2013-03-24 NOTE — Progress Notes (Signed)
Speech Language Pathology Daily Session Note  Patient Details  Name: Kevin Escobar MRN: [id number] Date of Birth: [birth year]  Today's Date: 03/24/2013 Time: 1015-1045 Time Calculation (min): 30 min  Short Term Goals: Week 4: SLP Short Term Goal 1 (Week 4): The patient will perform diaphragmatic breathing exercises with Supervision cuing.  SLP Short Term Goal 2 (Week 4): Patient will utilize compensatory strategies to increase speech intelligibility at the phrase level with Min verbal cues.   SLP Short Term Goal 3 (Week 4): Patient will self-monitor vocal intensity with Mod verbal and non-verbal cues.   Skilled Therapeutic Interventions: Skilled treatment session focused on addressing dysarthria goals.  SLP provided Min A verbal and demonstration cues for coordination of vocalization at the onset of exhalation during a functional but structured conversation and Mod A verbal cues for increased vocal intensity. Continue with current plan of care.   FIM:  Comprehension Comprehension Mode: Auditory Comprehension: 6-Follows complex conversation/direction: With extra time/assistive device Expression Expression: 4-Expresses basic 75 - 89% of the time/requires cueing 10 - 24% of the time. Needs helper to occlude trach/needs to repeat words. Social Interaction Social Interaction: 5-Interacts appropriately 90% of the time - Needs monitoring or encouragement for participation or interaction. Problem Solving Problem Solving: 5-Solves basic 90% of the time/requires cueing < 10% of the time Memory Memory: 5-Recognizes or recalls 90% of the time/requires cueing < 10% of the time  Pain Pain Assessment Pain Assessment: No/denies pain  Therapy/Group: Individual Therapy  Aishia Barkey 03/24/2013, 11:58 AM

## 2013-03-24 NOTE — Progress Notes (Signed)
Occupational Therapy Session Note  Patient Details  Name: Kevin Escobar MRN: [id number] Date of Birth: Mar 22, 1941  Today's Date: 03/24/2013 Time: 4098-1191 Time Calculation (min): 73 min  Short Term Goals: Week 4:  OT Short Term Goal 1 (Week 4): Pt will wash BLE including feet with steady assist OT Short Term Goal 2 (Week 4): Pt will complete bathing with min assist OT Short Term Goal 3 (Week 4): Pt will complete toilet transfer with min assist OT Short Term Goal 4 (Week 4): Pt will complete LB dressing with min assist  Skilled Therapeutic Interventions/Progress Updates:    Pt seen for hands on family education with focus on hemi-dressing techniques, BSC transfers, and tub/shower transfer with use of tub bench.  Pt's wife present for hands on education and demonstrated ability to provide necessary cues for dressing, required additional cues to allow pt increased time to attempt dressing and problem solve prior to assisting/doing it for him.  Pt reports fatigue this session, requiring increased time to allow for processing and mobility.  Educated pt's wife on proper body positioning to protect her back with lifting for transfer.  Discussed preference for squat pivot transfer to increase pt's participation and decrease burden on pt's wife.  Pt and wife return demonstrated safety with squat pivot transfer from w/c to [facility] with cues for sequencing.  Pt appears anxious with mobility with wife, however by end of session able to perform transfers with proper technique at mod/min assist.  Performed squat pivot transfer from w/c <> tub bench with similar squat pivot pattern with pt's wife with increased carryover of hand placement and body positioning.  Pt required multiple rest breaks as reports increased fatigue and anxious.  Discussed Self-ROM exercises and proper positioning to decrease impact on Rt subluxed shoulder.  Encouraged no overhead movement in seated position, however okay supine in bed with  gentle ROM.  Pt's wife to attend further family education session prior to d/c.  Therapy Documentation Precautions:  Precautions Precautions: Fall Precaution Comments: right hemiparesis  Restrictions Weight Bearing Restrictions: No General:   Vital Signs: Therapy Vitals Temp: 97.9 F (36.6 C) Temp src: Oral Pulse Rate: 59 Resp: 18 BP: 132/61 mmHg Oxygen Therapy SpO2: 97 % O2 Device: None (Room air) Pain:   Pt with no c/o pain this session.  See FIM for current functional status  Therapy/Group: Individual Therapy  Rosalio Loud 03/24/2013, 9:47 AM

## 2013-03-24 NOTE — Progress Notes (Signed)
Orthopedic Tech Progress Note Patient Details:  Kevin Escobar 01/10/1941 [id number]  Patient ID: [name], male   DOB: [birth year], 72 y.o.   MRN: [id number]   [name] 03/24/2013, 3:35 PMRight AFO completed by [facility].

## 2013-03-24 NOTE — Plan of Care (Signed)
Problem: RH BOWEL ELIMINATION Goal: RH STG MANAGE BOWEL WITH ASSISTANCE STG Manage Bowel with min Assistance.  Outcome: Not Progressing LBM 03-20-13

## 2013-03-25 ENCOUNTER — Inpatient Hospital Stay (HOSPITAL_COMMUNITY): Payer: Medicare Other

## 2013-03-25 ENCOUNTER — Inpatient Hospital Stay (HOSPITAL_COMMUNITY): Payer: Medicare Other | Admitting: Occupational Therapy

## 2013-03-25 ENCOUNTER — Inpatient Hospital Stay (HOSPITAL_COMMUNITY): Payer: Medicare Other | Admitting: Speech Pathology

## 2013-03-25 NOTE — Progress Notes (Signed)
Occupational Therapy Discharge Summary  Patient Details  Name: Kevin Escobar MRN: [id number] Date of Birth: Dec 24, 1940  Today's Date: 03/25/2013  Patient has met 12 of 12 long term goals due to improved activity tolerance, improved balance, postural control, ability to compensate for deficits, functional use of  RIGHT upper and RIGHT lower extremity and improved awareness.  Patient to discharge at [facility] Assist level.  Patient's care partner is independent to provide the necessary physical assistance at discharge.    Reasons goals not met: N/A  Recommendation:  Patient will benefit from ongoing skilled OT services in home health setting to continue to advance functional skills in the area of BADL and Reduce care partner burden.  Equipment: tub bench, w/c with half lap tray  Reasons for discharge: treatment goals met and discharge from hospital  Patient/family agrees with progress made and goals achieved: Yes  OT Discharge Precautions/Restrictions  Precautions Precautions: Fall Precaution Comments: right hemiparesis  Restrictions Weight Bearing Restrictions: No General   Vital Signs Therapy Vitals BP: 134/67 mmHg Oxygen Therapy SpO2: 98 % O2 Device: None (Room air) Pain Pain Assessment Pain Assessment: No/denies pain Pain Score: 0-No pain ADL ADL Eating: Set up Where Assessed-Eating: Wheelchair Grooming: Setup Where Assessed-Grooming: Sitting at sink Upper Body Bathing: Minimal assistance Where Assessed-Upper Body Bathing: Shower Lower Body Bathing: Minimal assistance Where Assessed-Lower Body Bathing: Shower Upper Body Dressing: Supervision/safety Where Assessed-Upper Body Dressing: Sitting at sink Lower Body Dressing: Moderate assistance Where Assessed-Lower Body Dressing: Sitting at sink;Standing at sink Toileting: Minimal assistance Where Assessed-Toileting: Teacher, adult education: Moderate assistance Toilet Transfer Method: Geologist, engineering: Gaffer: Moderate assistance Tub/Shower Transfer Method: Squat pivot Tub/Shower Equipment: Insurance underwriter: Moderate assistance Film/video editor Method: Warden/ranger: Transfer tub bench;Grab bars Vision/Perception  Vision - History Baseline Vision: Wears glasses only for reading Visual History: Cataracts;Corrective eye surgery Patient Visual Report: No change from baseline  Cognition Overall Cognitive Status: Within Functional Limits for tasks assessed Arousal/Alertness: Awake/alert Orientation Level: Oriented X4 Sensation Sensation Light Touch: Impaired Detail Light Touch Impaired Details: Impaired RUE Proprioception: Impaired Detail Proprioception Impaired Details: Impaired RUE Coordination Gross Motor Movements are Fluid and Coordinated: No Fine Motor Movements are Fluid and Coordinated: No Mobility  Bed Mobility Rolling Right: 5: Supervision Rolling Left: 5: Supervision Left Sidelying to Sit: 5: Supervision Supine to Sit: 5: Supervision Sit to Supine: 4: Min assist Transfers Sit to Stand: 3: Mod assist Stand to Sit: 4: Min assist  Extremity/Trunk Assessment RUE Assessment RUE Assessment: Exceptions to [facility] RUE Tone RUE Tone: Flaccid LUE Assessment LUE Assessment: Within Functional Limits  See FIM for current functional status  Mikayla Chiusano, [facility] 03/25/2013, 4:00 PM

## 2013-03-25 NOTE — Progress Notes (Signed)
Physical Therapy Session Note  Patient Details  Name: Kevin Escobar MRN: [id number] Date of Birth: 1940-08-30  Today's Date: 03/25/2013 Time: 1300-1400 Time Calculation (min): 60 min  Short Term Goals: Week 3:  PT Short Term Goal 1 (Week 3): Pt will perform bed mobility on flat bed consistently with min A and verbalizing sequence to wife PT Short Term Goal 1 - Progress (Week 3): Met PT Short Term Goal 2 (Week 3): Pt will performed bed <> w/c transfers to L and R consistently with min A PT Short Term Goal 2 - Progress (Week 3): Partly met PT Short Term Goal 3 (Week 3): Pt will perform w/c mobility in controlled and home environments with supervision x 150' PT Short Term Goal 3 - Progress (Week 3): Met PT Short Term Goal 4 (Week 3): Pt will perform ambulation with LRAD and appropriate orthoses x 25' and mod A PT Short Term Goal 4 - Progress (Week 3): Met PT Short Term Goal 5 (Week 3): Pt will ascend and descend 4 stairs with one rail, appropriate orthosis and mod A PT Short Term Goal 5 - Progress (Week 3): Met  Skilled Therapeutic Interventions/Progress Updates:  Treatment session focused on evaluation of ankle orthosis and family education with wife. Prosthetist brought AFO, right shoe with toe cap, and lift for left shoe. Donned for patient due to time - instructed wife in proper procedure for donning devices. Patient sit to stand with min steady assist. Patient ambulated 50 feet with hemi walker and mod assist for weight shifting to clear right LE during swing and upright trunk control. Patient able to progress right LE independently with orthosis and devices on level tile surfaces.  Patient and wife pleased with how they help with ambulation. Patient ambulated up and down 4 steps with rail on left with wife's mod assist for right LE. Patient's wife provided cueing appropriately. Patient performed wheelchair to bed stand pivot transfer with wife's min assist and use of bed rail only regular  double bed. Patient sit <> supine with assist to lift right LE onto bed only using bed rail. Wife reports that patient's brother purchased a bed rail and installed at their home today. Patient's wife demonstrated pushing wheelchair up and down ramp and bumping up and down 5 inch curb. Patient reported 2x feeling "woosy" - going from stand to sit and sit to supine. Stressed energy conservation and allowing time at home for activities. Patient and wife report no further questions. [facility] arrived with wheelchair. Patient and wife feel that 16 inch width is too narrow and asked to change out to 18 x 18. Discussed narrow width to accommodate in home. Wife reports they have transport chair that is narrower and would rather have more room in this chair. Patient left with OT.   Therapy Documentation Precautions:  Precautions Precautions: Fall Precaution Comments: right hemiparesis  Restrictions Weight Bearing Restrictions: No Vital Signs: Therapy Vitals BP: 134/67 mmHg Oxygen Therapy SpO2: 98 % O2 Device: None (Room air) Pain: Pain Assessment Pain Assessment: No/denies pain Pain Score: 0-No pain Mobility: Bed Mobility Rolling Right: 5: Supervision Rolling Left: 5: Supervision Left Sidelying to Sit: 5: Supervision Supine to Sit: 5: Supervision Sit to Supine: 4: Min assist Transfers Sit to Stand: 3: Mod assist Stand to Sit: 4: Min assist Stand Pivot Transfers: 3: Mod assist Locomotion : Ambulation Ambulation: Yes Ambulation/Gait Assistance: 3: Mod assist Ambulation Distance (Feet): 50 Feet Assistive device: Hemi-walker Gait Gait: No Gait Pattern: Decreased stance time -  right;Decreased step length - right;Decreased dorsiflexion - right;Decreased hip/knee flexion - right;Right flexed knee in stance;Trunk flexed;Narrow base of support;Step-to pattern Stairs / Additional Locomotion Stairs: Yes Stairs Assistance: 3: Mod assist Stair Management Technique: One rail Left;Step to  pattern Number of Stairs: 4 Height of Stairs: 4   See FIM for current functional status  Therapy/Group: Individual Therapy  Alma Friendly 03/25/2013, 3:34 PM

## 2013-03-25 NOTE — Progress Notes (Signed)
Occupational Therapy Session Note  Patient Details  Name: Kevin Escobar MRN: [id number] Date of Birth: Mar 09, 1941  Today's Date: 03/25/2013 Time: 4098-1191 and 4782-9562 Time Calculation (min): 58 min and 33 min  Short Term Goals: Week 4:  OT Short Term Goal 1 (Week 4): Pt will wash BLE including feet with steady assist OT Short Term Goal 2 (Week 4): Pt will complete bathing with min assist OT Short Term Goal 3 (Week 4): Pt will complete toilet transfer with min assist OT Short Term Goal 4 (Week 4): Pt will complete LB dressing with min assist  Skilled Therapeutic Interventions/Progress Updates:    1) Pt completed ADL retraining at overall min assist with min-mod assist transfers.  Focus on hemi-technique with bathing and dressing and functional transfers.  Squat pivot transfer bed > w/c and w/c tub bench with min assist when going to Lt (and able to reach with strong arm).  Verbal cues to increase active participation in bathing with use of body positioning to wash Rt arm and armpit and use of long handled sponge to wash feet.  Pt continues to require physical assistance to lift RLE to wash foot and when threading Rt pant leg.  Mod assist squat pivot transfer tub bench to w/c when going to Rt.  Pt completed grooming in sitting with setup assist to obtain items.  2) Pt seen for 1:1 OT with focus on hands on education with wife with further education with transfers.  W/c delivered to room and pt's wife concerned w/c too narrow, discussed with PT and provider who reports he can bring up different chair tomorrow prior to d/c.  Performed stand pivot transfer w/c > bed > BSC and back x3 with focus on alternating between stand pivot and squat pivot.  Pt's wife with increased carryover of body positioning and hand placement to assist pt with transfers.  Pt min-mod assist transfers with wife.  Pt's wife reports no further questions and prepared for d/c.    Therapy Documentation Precautions:   Precautions Precautions: Fall Precaution Comments: right hemiparesis  Restrictions Weight Bearing Restrictions: No Pain: Pain Assessment Pain Assessment: No/denies pain  See FIM for current functional status  Therapy/Group: Individual Therapy  Rosalio Loud 03/25/2013, 11:30 AM

## 2013-03-25 NOTE — Progress Notes (Signed)
Patient has declined all offers to apply warm compresses to lower back,ordered TID, patient has sebaceous cyst to lower back, alleyvn dressing in place, placed 03/23/13. Cyst with yellow tissue noted in bed of cyst. Patient also refused voltaren gel to right wrist. Kevin Escobar, Kevin Escobar

## 2013-03-25 NOTE — Progress Notes (Signed)
Speech Language Pathology Daily Session Note & Discharge Summary   Patient Details  Name: Kevin Escobar MRN: [id number] Date of Birth: 28-May-1941  Today's Date: 03/25/2013 Time: 4098-1191 Time Calculation (min): 25 min  Short Term Goals: Week 4: SLP Short Term Goal 1 (Week 4): The patient will perform diaphragmatic breathing exercises with Supervision cuing.  SLP Short Term Goal 2 (Week 4): Patient will utilize compensatory strategies to increase speech intelligibility at the phrase level with Min verbal cues.   SLP Short Term Goal 3 (Week 4): Patient will self-monitor vocal intensity with Mod verbal and non-verbal cues.   Skilled Therapeutic Interventions: Skilled treatment session focused on completion of patient education.  SLP facilitated session with handout and request for patient to return demonstration of home program exercises.  Patient continues to require Min verbal cues to recall and accurately follow procedures; as well as Min cues to utilize speech intelligibility strategies.  SLP and patietn discussed follow-up options and patient feels that he can manage speech at home with family's help/support.  As a result, it is recommended that he follow-up with SLP in outpatient setting if needs still persisit at that time.     FIM:  Comprehension Comprehension Mode: Auditory Comprehension: 6-Follows complex conversation/direction: With extra time/assistive device Expression Expression Mode: Verbal Expression: 4-Expresses basic 75 - 89% of the time/requires cueing 10 - 24% of the time. Needs helper to occlude trach/needs to repeat words. Social Interaction Social Interaction: 5-Interacts appropriately 90% of the time - Needs monitoring or encouragement for participation or interaction. Problem Solving Problem Solving: 5-Solves basic 90% of the time/requires cueing < 10% of the time Memory Memory: 5-Recognizes or recalls 90% of the time/requires cueing < 10% of the  time  Pain Pain Assessment Pain Assessment: No/denies pain  Therapy/Group: Individual Therapy   Speech Language Pathology Discharge Summary  Patient Details  Name: Kevin Escobar MRN: [id number] Date of Birth: Dec 27, 1940  Today's Date: 03/25/2013  Patient has met 4 of 4 long term goals.  Patient to discharge at [facility] level.  Reasons goals not met: n/a   Clinical Impression/Discharge Summary: Patient met 4 out of 4 long term goals this reporting period due to gains in diet advancement and toleration as well as carryover of exercises to address diaphragamtic breahting and ability to utilize speech intelligibility compenstory strategies.  Family education with wife has been ongoing and patient was been provided with a handout for a home program; as a result, home health therapy is not warranted at this time and SLP defers recommendation for outpatient therapy to patient and family when he gets to the appropriate level.    Care Partner:  Caregiver Able to Provide Assistance: Yes  Type of Caregiver Assistance:  (Linguistic)  Recommendation:  24 hour supervision/assistance;Outpatient SLP  Rationale for SLP Follow Up: Maximize functional communication   Equipment: none   Reasons for discharge: Treatment goals met;Discharged from hospital   Patient/Family Agrees with Progress Made and Goals Achieved: Yes   See FIM for current functional status  Charlane Ferretti., CCC-SLP [phone]  Renso Swett 03/25/2013, 9:49 AM

## 2013-03-25 NOTE — Progress Notes (Signed)
Patient ID: Kevin Escobar, male   DOB: 1940/09/27, 72 y.o.   MRN: [id number] Subjective/Complaints: Slept well but feels tired today ROS  A 12 point review of systems has been performed and if not noted above is otherwise negative.   Objective: Vital Signs: Blood pressure 144/79, pulse 55, temperature 97.6 F (36.4 C), temperature source Oral, resp. rate 17, height 5' 10.87" (1.8 m), weight 63.6 kg (140 lb 3.4 oz), SpO2 95.00%.     Results for orders placed during the hospital encounter of 02/26/13 (from the past 72 hour(s))  BASIC METABOLIC PANEL     Status: Abnormal   Collection Time    03/23/13  6:00 AM      Result Value Range   Sodium 139  135 - 145 mEq/L   Potassium 3.7  3.5 - 5.1 mEq/L   Chloride 107  96 - 112 mEq/L   CO2 22  19 - 32 mEq/L   Glucose, Bld 86  70 - 99 mg/dL   BUN 36 (*) 6 - 23 mg/dL   Creatinine, Ser 4.09 (*) 0.50 - 1.35 mg/dL   Calcium 9.2  8.4 - 81.1 mg/dL   GFR calc non Af Amer 21 (*) >90 mL/min   GFR calc Af Amer 25 (*) >90 mL/min   Comment: (NOTE)     The eGFR has been calculated using the CKD EPI equation.     This calculation has not been validated in all clinical situations.     eGFR's persistently <90 mL/min signify possible Chronic Kidney     Disease.     HEENT: Eyes not injected, oral mucosa moist, external ears normal Cardio: RRR and No murmur Resp: CTA B/L and unlabored GI: BS positive and Nontender Extremity:  Pulses positive and No Edema Skin:   Low thoracic back notable for sebaceous cyst which is red/tender, does not appear infected. + yellow thick drainage. Neuro: More Alert/Oriented, Flat, Cranial Nerve Abnormalities Right central 7, Normal Sensory, Abnormal Motor tr to 2-/5 in the right upper except 2-/5 R finger flexors.(trace tone at wrist,fingers) RLE tr in HF, KE. 2-/5 distally. Equinovarus position of right foot. Tone:    Dysarthria continues Musc/Skel:  Normal General no acute distress   Assessment/Plan: 1. Functional  deficits secondary to Left PLIC infarct right hemiplegia which require 3+ hours per day of interdisciplinary therapy in a comprehensive inpatient rehab setting. Physiatrist is providing close team supervision and 24 hour management of active medical problems listed below. Physiatrist and rehab team continue to assess barriers to discharge/monitor patient progress toward functional and medical goals. Should be ready in am  FIM: FIM - Bathing Bathing Steps Patient Completed: Chest;Right Arm;Abdomen;Front perineal area;Buttocks;Right upper leg;Left upper leg;Right lower leg (including foot);Left lower leg (including foot) Bathing: 4: Min-Patient completes 8-9 78f 10 parts or 75+ percent  FIM - Upper Body Dressing/Undressing Upper body dressing/undressing steps patient completed: Thread/unthread right sleeve of pullover shirt/dresss;Thread/unthread left sleeve of pullover shirt/dress;Put head through opening of pull over shirt/dress;Pull shirt over trunk Upper body dressing/undressing: 5: Supervision: Safety issues/verbal cues FIM - Lower Body Dressing/Undressing Lower body dressing/undressing steps patient completed: Thread/unthread left underwear leg;Pull underwear up/down;Thread/unthread left pants leg;Fasten/unfasten left shoe;Don/Doff left shoe;Pull pants up/down Lower body dressing/undressing: 3: Mod-Patient completed 50-74% of tasks  FIM - Toileting Toileting: 0: Activity did not occur  FIM - Diplomatic Services operational officer Devices: Bedside commode Toilet Transfers: 3-To toilet/BSC: Mod A (lift or lower assist);3-From toilet/BSC: Mod A (lift or lower assist)  FIM -  Bed/Chair Transport planner Devices: Arm rests;Walker;Orthosis Bed/Chair Transfer: 2: Bed > Chair or W/C: Max A (lift and lower assist);2: Chair or W/C > Bed: Max A (lift and lower assist)  FIM - Locomotion: Wheelchair Distance: 150 Locomotion: Wheelchair: 1: Total Assistance/staff pushes  wheelchair (Pt<25%) FIM - Locomotion: Ambulation Locomotion: Ambulation Assistive Devices: Walker - Hemi;Orthosis Ambulation/Gait Assistance: 3: Mod assist Locomotion: Ambulation: 1: Travels less than 50 ft with moderate assistance (Pt: 50 - 74%)  Comprehension Comprehension Mode: Auditory Comprehension: 6-Follows complex conversation/direction: With extra time/assistive device  Expression Expression Mode: Verbal Expression: 4-Expresses basic 75 - 89% of the time/requires cueing 10 - 24% of the time. Needs helper to occlude trach/needs to repeat words.  Social Interaction Social Interaction Mode: Asleep Social Interaction: 5-Interacts appropriately 90% of the time - Needs monitoring or encouragement for participation or interaction.  Problem Solving Problem Solving Mode: Asleep Problem Solving: 5-Solves basic 90% of the time/requires cueing < 10% of the time  Memory Memory Mode: Asleep Memory: 5-Recognizes or recalls 90% of the time/requires cueing < 10% of the time   Medical Problem List and Plan:  1. DVT Prophylaxis/Anticoagulation: Pharmaceutical: Lovenox  2. Pain Management: will add scheduled hs tramadol for back pain. 3. Mood: Provide anxiety/mood support. Has a supportive family. On nicotine patch  -changed to lexapro 5mg   qpm 4. Neuropsych: This patient is capable of making decisions on his own behalf.  5. HTN: will monitor with bid checks. Continue metoprolol, norvasc and catapres. Off lasix due to renal insufficiency/dysphagia diet. Mainly systolic elevation. Clonidine  adjusted. improved 6. Reactive leucocytosis: Resolving.  7. Dyslipidemia: Continue lipitor.  8. Dysphagia: continue D2, nectar liquids. Continue to educate patient and family on compliance of current restrictions and aspiration risk.   9. Prostate cancer/bladder CA: H/o frequency. Toilet every 2-3 hours to help with continence. Condom cath at night.  10. CKD: Will monitor with routine checks. Currently  at baseline.  11.  UTI --proteus and serratia changed to cipro to cover both 12. Sebaceous cyst on back- continue moist heat, express any fluid contained inside as possible, on abx, improving  LOS (Days) 27 A FACE TO FACE EVALUATION WAS PERFORMED  KIRSTEINS,ANDREW E 03/25/2013, 8:14 AM

## 2013-03-26 MED ORDER — DICLOFENAC SODIUM 1 % TD GEL: 2.0000 g | Freq: Four times a day (QID) | TRANSDERMAL | Status: DC

## 2013-03-26 MED ORDER — ATORVASTATIN CALCIUM 20 MG PO TABS: 20.0000 mg | ORAL_TABLET | Freq: Every day | ORAL | Status: DC

## 2013-03-26 MED ORDER — CIPROFLOXACIN HCL 250 MG PO TABS: 250.0000 mg | ORAL_TABLET | Freq: Every day | ORAL | Status: DC

## 2013-03-26 MED ORDER — ZOLPIDEM TARTRATE 5 MG PO TABS: 5.0000 mg | ORAL_TABLET | Freq: Every day | ORAL | Status: DC

## 2013-03-26 MED ORDER — AMLODIPINE BESYLATE 10 MG PO TABS: 10.0000 mg | ORAL_TABLET | ORAL | Status: DC

## 2013-03-26 MED ORDER — CLONIDINE HCL 0.1 MG PO TABS: 0.1000 mg | ORAL_TABLET | Freq: Three times a day (TID) | ORAL | Status: DC

## 2013-03-26 MED ORDER — ALPRAZOLAM 0.25 MG PO TABS: 0.2500 mg | ORAL_TABLET | Freq: Two times a day (BID) | ORAL | Status: DC | PRN

## 2013-03-26 MED ORDER — ESCITALOPRAM OXALATE 5 MG PO TABS: 5.0000 mg | ORAL_TABLET | Freq: Every day | ORAL | Status: DC

## 2013-03-26 MED ORDER — NICOTINE 7 MG/24HR TD PT24: 5.0000 | MEDICATED_PATCH | Freq: Every day | TRANSDERMAL | Status: DC

## 2013-03-26 MED ORDER — FLAVOXATE HCL 100 MG PO TABS: 100.0000 mg | ORAL_TABLET | Freq: Three times a day (TID) | ORAL | Status: DC

## 2013-03-26 MED ORDER — METOPROLOL TARTRATE 12.5 MG HALF TABLET: 12.5000 mg | ORAL_TABLET | Freq: Two times a day (BID) | ORAL | Status: DC

## 2013-03-26 MED ORDER — CLOPIDOGREL BISULFATE 75 MG PO TABS: 75.0000 mg | ORAL_TABLET | Freq: Every day | ORAL | Status: DC

## 2013-03-26 NOTE — Progress Notes (Signed)
Patient ID: Kevin Escobar, male   DOB: March 20, 1941, 72 y.o.   MRN: [id number] Subjective/Complaints:  Slept poorly, no new c/os Wife did training yesterday ROS  A 12 point review of systems has been performed and if not noted above is otherwise negative.   Objective: Vital Signs: Blood pressure 140/69, pulse 56, temperature 97.9 F (36.6 C), temperature source Oral, resp. rate 20, height 5' 10.87" (1.8 m), weight 63.6 kg (140 lb 3.4 oz), SpO2 97.00%.     No results found for this or any previous visit (from the past 72 hour(s)).   HEENT: Eyes not injected, oral mucosa moist, external ears normal Cardio: RRR and No murmur Resp: CTA B/L and unlabored GI: BS positive and Nontender Extremity:  Pulses positive and No Edema Skin:   Low thoracic back notable for sebaceous cyst which is red/tender, does not appear infected. + yellow thick drainage. Neuro: More Alert/Oriented, Flat, Cranial Nerve Abnormalities Right central 7, Normal Sensory, Abnormal Motor tr to 2-/5 in the right upper except 2-/5 R finger flexors.(trace tone at wrist,fingers) RLE tr in HF, KE. 2-/5 distally. Equinovarus position of right foot. Tone:    Dysarthria continues Musc/Skel:  Normal General no acute distress   Assessment/Plan: 1. 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 Physiatrist is providing close team supervision and 24 hour management of active medical problems listed below. Physiatrist and rehab team continue to assess barriers to discharge/monitor patient progress toward functional and medical goals.  Stable for D/C today F/u PCP in 1-2 weeks F/u PM&R 3 weeks See D/C summary See D/C instructions  FIM: FIM - Bathing Bathing Steps Patient Completed: Chest;Right Arm;Abdomen;Front perineal area;Buttocks;Right upper leg;Left upper leg;Right lower leg (including foot);Left  lower leg (including foot) Bathing: 4: Min-Patient completes 8-9 51f 10 parts or 75+ percent  FIM - Upper Body Dressing/Undressing Upper body dressing/undressing steps patient completed: Thread/unthread right sleeve of pullover shirt/dresss;Thread/unthread left sleeve of pullover shirt/dress;Put head through opening of pull over shirt/dress;Pull shirt over trunk Upper body dressing/undressing: 5: Supervision: Safety issues/verbal cues FIM - Lower Body Dressing/Undressing Lower body dressing/undressing steps patient completed: Thread/unthread left underwear leg;Pull underwear up/down;Thread/unthread left pants leg;Fasten/unfasten left shoe;Don/Doff left shoe;Pull pants up/down;Fasten/unfasten right shoe Lower body dressing/undressing: 3: Mod-Patient completed 50-74% of tasks  FIM - Toileting Toileting: 0: Activity did not occur  FIM - Diplomatic Services operational officer Devices: Bedside commode Toilet Transfers: 3-To toilet/BSC: Mod A (lift or lower assist);3-From toilet/BSC: Mod A (lift or lower assist)  FIM - Bed/Chair Transfer Bed/Chair Transfer Assistive Devices: Bed rails;Arm rests Bed/Chair Transfer: 5: Supine > Sit: Supervision (verbal cues/safety issues);4: Sit > Supine: Min A (steadying pt. > 75%/lift 1 leg);3: Bed > Chair or W/C: Mod A (lift or lower assist);3: Chair or W/C > Bed: Mod A (lift or lower assist)  FIM - Locomotion: Wheelchair Distance: 150 Locomotion: Wheelchair: 1: Total Assistance/staff pushes wheelchair (Pt<25%) FIM - Locomotion: Ambulation Locomotion: Ambulation Assistive Devices: Walker - Hemi;Orthosis Ambulation/Gait Assistance: 3: Mod assist Locomotion: Ambulation: 2: Travels 50 - 149 ft with moderate assistance (Pt: 50 - 74%)  Comprehension Comprehension Mode: Auditory Comprehension: 6-Follows complex conversation/direction: With extra time/assistive device  Expression Expression Mode: Verbal Expression: 4-Expresses basic 75 - 89% of the  time/requires cueing 10 - 24% of the time. Needs helper to occlude trach/needs to repeat words.  Social Interaction Social Interaction Mode: Asleep Social Interaction: 5-Interacts appropriately 90% of the  time - Needs monitoring or encouragement for participation or interaction.  Problem Solving Problem Solving Mode: Asleep Problem Solving: 5-Solves basic 90% of the time/requires cueing < 10% of the time  Memory Memory Mode: Asleep Memory: 5-Recognizes or recalls 90% of the time/requires cueing < 10% of the time   Medical Problem List and Plan:  1. DVT Prophylaxis/Anticoagulation: Pharmaceutical: Lovenox  2. Pain Management: will add scheduled hs tramadol for back pain. 3. Mood: Provide anxiety/mood support. Has a supportive family. On nicotine patch  -changed to lexapro 5mg   qpm 4. Neuropsych: This patient is capable of making decisions on his own behalf.  5. HTN: will monitor with bid checks. Continue metoprolol, norvasc and catapres. Off lasix due to renal insufficiency/dysphagia diet. Mainly systolic elevation. Clonidine  adjusted. improved 6. Reactive leucocytosis: Resolving.  7. Dyslipidemia: Continue lipitor.  8. Dysphagia: continue D2, nectar liquids. Continue to educate patient and family on compliance of current restrictions and aspiration risk.   9. Prostate cancer/bladder CA: H/o frequency. Toilet every 2-3 hours to help with continence. Condom cath at night.  10. CKD: Will monitor with routine checks. Currently at baseline.  11.  UTI --proteus and serratia changed to cipro to cover both trhough 8/23 12. Sebaceous cyst on back- continue moist heat, express any fluid contained inside as possible, on abx, improving  LOS (Days) 28 A FACE TO FACE EVALUATION WAS PERFORMED  Kevin Escobar 03/26/2013, 8:02 AM

## 2013-03-26 NOTE — Progress Notes (Signed)
Late entry: Pt discharged home with family at [facility]. Discharged instructions provided by Harvel Ricks, PA. All questions answered. Pt verbalized understanding.  Pt escorted off unit in w/c with personal belonging by [name], [place].

## 2013-03-26 NOTE — Progress Notes (Signed)
Orthopedic Tech Progress Note Patient Details:  Kevin Escobar June 23, 1941 [id number]  Patient ID: [name], male   DOB: 1940/12/24, 72 y.o.   MRN: [id number] Brace order  Completed by [facility]  Ethelwyn Gilbertson 03/26/2013, 11:08 PM

## 2013-03-26 NOTE — Progress Notes (Signed)
Social Work Discharge Note Discharge Note  The overall goal for the admission was met for:   Discharge location: Yes-HOME WITH WIFE AND OTHER FAMILY MEMBERS TO ASSIST-24 HR  Length of Stay: Yes-28 DAYS  Discharge activity level: Yes-MIN/MOD LEVEL  Home/community participation: Yes  Services provided included: MD, RD, PT, OT, SLP, RN, TR, Pharmacy, Neuropsych and SW  Financial Services: Medicare and Private Insurance: [employer]  Follow-up services arranged: Home Health: [facility]-PT,OT,RN, DME: [facility]-HEMI-WALKER,TUB BENCH,WHEELCHAIR and Patient/Family has no preference for HH/DME agencies ALSO TRANSPORT CHAIR-WIFE PURCHASED Comments (or additional information):FAMILY EDUCATION COMPLETED WITH WIFE AND BROTHER, FEEL COMFORTABLE WITH HIS CARE  Patient/Family verbalized understanding of follow-up arrangements: Yes  Individual responsible for coordination of the follow-up plan: WIFE  Confirmed correct DME delivered: Lucy Chris 03/26/2013    Lucy Chris

## 2013-03-26 NOTE — Plan of Care (Signed)
Problem: RH BOWEL ELIMINATION Goal: RH STG MANAGE BOWEL WITH ASSISTANCE STG Manage Bowel with min Assistance.  Outcome: Not Met (add Reason) Pt incontinent, per report

## 2013-03-26 NOTE — Progress Notes (Signed)
Physical Therapy Discharge Summary  Patient Details  Name: Kevin Escobar MRN: [id number] Date of Birth: [birth year]  Today's Date: 03/26/2013  Patient has made good progress and has met 10 of 10 long term goals due to improved activity tolerance, improved balance, improved postural control, increased strength, decreased pain, ability to compensate for deficits, functional use of  right lower extremity and improved coordination.  Patient to discharge at a wheelchair level supervision for w/c mobility and Min Assist for basic transfers, mod A for stair negotiation with one rail.  Pt will also have assistance of brother for stair negotiation for home entry/exit.  Secondary to pt requiring increased assistance for gait patient and wife advised to refrain from household ambulation and continue use of w/c for household mobility.  Pt to continue to address gait with HHPT and to progress to household ambulation when appropriate and safe.   Patient's care partner is independent to provide the necessary physical and supervision assistance at discharge.  Reasons goals not met: All goals met  Recommendation:  Patient will benefit from ongoing skilled PT services in home health setting to continue to advance safe functional mobility, address ongoing impairments in R hemiplegia, impaired motor control, timing, sequencing, motor impersistence, impaired postural control, balance, gait, activity tolerance/endurance, and minimize fall risk.  Equipment: R AFO, manual w/c, transport w/c, hemi walker  Reasons for discharge: treatment goals met and discharge from hospital  Patient/family agrees with progress made and goals achieved: Yes  See FIM for current functional status  Edman Circle [facility] 03/26/2013, 9:39 AM

## 2013-03-27 NOTE — Discharge Summary (Signed)
Physician Discharge Summary  Patient ID: Kevin Escobar MRN: [id number] DOB/AGE: 1941/06/15 72 y.o.  Admit date: 02/26/2013 Discharge date: 03/27/2013  Discharge Diagnoses:  Principal Problem:   CVA (cerebral vascular accident) Active Problems:   Chronic kidney disease   Hypertension   Other emphysema   Discharged Condition:  Improved.   Significant Diagnostic Studies:  Labs:  Basic Metabolic Panel:  Recent Labs Lab 03/23/13 0600  NA 139  K 3.7  CL 107  CO2 22  GLUCOSE 86  BUN 36*  CREATININE 2.73*  CALCIUM 9.2    CBC:    Component Value Date/Time   WBC 8.8 03/01/2013 0515   RBC 4.02* 03/01/2013 0515   HGB 12.2* 03/01/2013 0515   HCT 35.7* 03/01/2013 0515   PLT 230 03/01/2013 0515   MCV 88.8 03/01/2013 0515   MCH 30.3 03/01/2013 0515   MCHC 34.2 03/01/2013 0515   RDW 13.9 03/01/2013 0515   LYMPHSABS 1.7 03/01/2013 0515   MONOABS 1.0 03/01/2013 0515   EOSABS 0.8* 03/01/2013 0515   BASOSABS 0.0 03/01/2013 0515     CBG:  Recent Labs Lab 03/20/13 0750  GLUCAP 83     Brief HPI:   Kevin Escobar is a 72 y.o. male with a past medical history significant for HTN, TIA, CKD, lupus, renal artery stenosis s/p stent placement, AAA repair in 1995, prostate and bladder cancer; admitted on 02/21/13 with progressive difficulty walking due to right sided weakness and fall as well and 2 episodes of transient language impairment for >24 hours PTA. MRI/MRA brain with acute left posterior limb infarct and no IC stenosis. 2D echo with EF 60-65% and No wall abnormality. Carotid dopplers with mild soft plaque L-ICA origin. Patient with neurological worsening on 07/21 with vomiting/?aspiration event. He was placed on bedrest and follow up CCT stable.  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 He continues on D1 nectar liquids due to aspiration of thins. Therapy team recommended CIR for progression.    Hospital Course:  Kevin Escobar was admitted to rehab 02/26/2013 for inpatient therapies to consist of PT, ST and OT at least three hours five days a week. Past admission physiatrist, therapy team and rehab RN have worked together to provide customized collaborative inpatient rehab. He was maintained on plavix for CVA prophylaxis. Blood pressures were monitored on bid basis. Lasix was discontinued to do thickened liquids and CKD. Catapres was adjusted for tighter blood pressure control. High levels of anxiety and depressed mood improved with addition of Lexapro and ego support has been provided by rehab team.    He has required scheduled toileting to help with chronic frequency. He reported dysuria and was found to have Proteus/Serratia UTI and treated with Cipro x 7 days. He had developed inflammation of sebeacous cyst on the back that was treated with moist heat and fluid expressed as able. These measures in addition to antibiotics have resolved inflammation. Patient has made good progress and is at min assist at wheel chair level. He will continue with home health PT and OT  By [facility] past discharge.    Rehab course: During patient's stay in rehab weekly team conferences were held to monitor patient's progress, set goals and discuss barriers to discharge. Patient has had improvement in activity tolerance, balance, postural control, as well as ability to compensate for deficits. He is has had improvement in functional use RUE  and RLE as well as improved awareness. Speech therapy-addressing  cognitive-linguistic goals. Patient requires min verbal cues to recall and accurately follow diaphragmatic breathing exercises as well as  well as Min cues to utilize speech intelligibility strategies.  Dysphagia treatment on going with focus on utilization of safe swallow strategies. He was advanced to D3, thin liquids and was tolerating diet without signs or symptoms of aspiration.   Occupational therapy has focused on  neuromuscular reeducation of UE as well as ADL tasks and endurance. He requires min assist for bathing tasks. He is able to dress UB with supervision and requires mod assist for lower body dressing tasks. He requires mod assist for toilet transfers.  Physical therapy has focused on bed mobility, transfers, w/c propulsion, neuromuscular re-education. Patient is able to propel wheelchair with supervision. He requires min assist for basic transfers, moderate assist for stair navigation. He is able to ambulate 50 feet with hemi walker, right AFO and with moderate assist. Family education was done with wife regarding assistance needed with mobility. She was educated on refraining from ambulating patient at home and has been educated on energy conservation as well as increase time for activities at home.    Disposition: 01-Home or Self Care   Diet: Dysphagia 3, thin liquids.   Special Instructions: 1. Needs assistance with all mobility. Refrain from walking with family. 2. Push po fluids. 3.  [facility] to provide PT, OT.    Future Appointments Provider Department Dept Phone   04/16/2013 12:45 PM Erick Colace, MD [facility] [phone]       Medication List    STOP taking these medications       acetaminophen 500 MG tablet  Commonly known as:  TYLENOL     diphenhydrAMINE 25 MG tablet  Commonly known as:  BENADRYL     furosemide 40 MG tablet  Commonly known as:  LASIX     LORazepam 2 MG/ML injection  Commonly known as:  ATIVAN     simvastatin 10 MG tablet  Commonly known as:  ZOCOR     traZODone 50 MG tablet  Commonly known as:  DESYREL      TAKE these medications       ALPRAZolam 0.25 MG tablet  Commonly known as:  XANAX  Take 1 tablet (0.25 mg total) by mouth 2 (two) times daily as needed for anxiety.     amLODipine 10 MG tablet  Commonly known as:  NORVASC  Take 1 tablet (10 mg total) by mouth See admin instructions. Takes 1 tablet  every day at noon and a 2nd tablet 2-3 times week (random days) in the evening     atorvastatin 20 MG tablet  Commonly known as:  LIPITOR  Take 1 tablet (20 mg total) by mouth daily at 6 PM.     ciprofloxacin 250 MG tablet  Commonly known as:  CIPRO  Take 1 tablet (250 mg total) by mouth daily.     cloNIDine 0.1 MG tablet  Commonly known as:  CATAPRES  Take 1 tablet (0.1 mg total) by mouth 3 (three) times daily.     clopidogrel 75 MG tablet  Commonly known as:  PLAVIX  Take 1 tablet (75 mg total) by mouth daily with breakfast.     diclofenac sodium 1 % Gel  Commonly known as:  VOLTAREN  Apply 2 g topically 4 (four) times daily.     escitalopram 5 MG tablet  Commonly known as:  LEXAPRO  Take 1 tablet (5 mg total) by mouth daily.  flavoxATE 100 MG tablet  Commonly known as:  URISPAS  Take 1 tablet (100 mg total) by mouth 3 (three) times daily.     metoprolol tartrate 12.5 mg Tabs tablet  Commonly known as:  LOPRESSOR  Take 0.5 tablets (12.5 mg total) by mouth 2 (two) times daily.     nicotine 7 mg/24hr patch  Commonly known as:  NICODERM CQ - dosed in mg/24 hr  Place 5 patches onto the skin daily.     zolpidem 5 MG tablet  Commonly known as:  AMBIEN  Take 1 tablet (5 mg total) by mouth at bedtime.           Follow-up Information   Follow up with Erick Colace, MD On 04/16/2013. (Be there at 12:14  for  12:45 pm appointment)    Specialty:  Physical Medicine and Rehabilitation   Contact information:   [address] [place] [address] [phone]       Follow up with Gates Rigg, MD. Call today. (for follow up in 6 weeks)    Specialties:  Neurology, Radiology   Contact information:   [address] [place] [address] [phone]       Follow up with [facility], MD On 04/16/2013. (@ 2pm)    Specialty:  Family Medicine   Contact information:   [address] [place] [address] [phone]        Signed: Jacquelynn Cree 03/27/2013, 12:17 AM

## 2013-04-01 ENCOUNTER — Other Ambulatory Visit: Payer: No Typology Code available for payment source

## 2013-04-01 ENCOUNTER — Inpatient Hospital Stay: Admission: RE | Admit: 2013-04-01 | Payer: No Typology Code available for payment source | Source: Ambulatory Visit

## 2013-04-16 ENCOUNTER — Ambulatory Visit (HOSPITAL_BASED_OUTPATIENT_CLINIC_OR_DEPARTMENT_OTHER): Payer: Medicare Other | Admitting: Physical Medicine & Rehabilitation

## 2013-04-16 ENCOUNTER — Encounter: Payer: Self-pay | Admitting: Physical Medicine & Rehabilitation

## 2013-04-16 ENCOUNTER — Encounter: Payer: Medicare Other | Attending: Physical Medicine & Rehabilitation

## 2013-04-16 VITALS — BP 135/57 | HR 62 | Resp 16 | Ht 71.0 in | Wt 150.0 lb

## 2013-04-16 DIAGNOSIS — I69959 Hemiplegia and hemiparesis following unspecified cerebrovascular disease affecting unspecified side: Secondary | ICD-10-CM | POA: Insufficient documentation

## 2013-04-16 DIAGNOSIS — G8110 Spastic hemiplegia affecting unspecified side: Secondary | ICD-10-CM

## 2013-04-16 NOTE — Progress Notes (Signed)
Subjective:    Patient ID: Kevin Escobar, male    DOB: 10-29-1940, 72 y.o.   MRN: [id number]  HPI Kevin Escobar is a 72 y.o. male with a past medical history significant for HTN, TIA, CKD, lupus, renal artery stenosis s/p stent placement, AAA repair in 1995, prostate and bladder cancer; admitted on 02/21/13 with progressive difficulty walking due to right sided weakness and fall as well and 2 episodes of transient language impairment for >24 hours PTA. MRI/MRA brain with acute left posterior limb infarct and no IC stenosis. 2D echo with EF 60-65% and No wall abnormality. Carotid dopplers with mild soft plaque L-ICA origin. Patient with neurological worsening on 07/21 with vomiting/?aspiration event. He was placed on bedrest and follow up CCT stable. 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 He continues on D1 nectar liquids due to aspiration of thins  CIR stay Admit date: 02/26/2013  Discharge date: 03/27/2013  Home health PT/OT No falls in two weeks.  Larey Seat OOB a couple times before. Pain Inventory Average Pain 0 Pain Right Now 0 My pain is swelling  In the last 24 hours, has pain interfered with the following? General activity 0 Relation with others 0 Enjoyment of life 0 What TIME of day is your pain at its worst? n/a Sleep (in general) Fair  Pain is worse with: some activites Pain improves with: n/a Relief from Meds: n/a  Mobility use a wheelchair  Function retired I need assistance with the following:  bathing and toileting  Neuro/Psych bladder control problems bowel control problems weakness numbness tingling trouble walking  Prior Studies Any changes since last visit?  no  Physicians involved in your care Any changes since last visit?  no   History reviewed. No pertinent family history. History   Social History  . Marital Status: Married    Spouse Name: N/A    Number of Children: N/A  .  Years of Education: N/A   Social History Main Topics  . Smoking status: Former Smoker -- 1.00 packs/day for 60 years    Quit date: 02/20/2013  . Smokeless tobacco: Never Used  . Alcohol Use: No  . Drug Use: No  . Sexual Activity: None   Other Topics Concern  . None   Social History Narrative  . None   Past Surgical History  Procedure Laterality Date  . Abdominal aortic aneurysm repair  1995  . Prostate surgery  1994  . Colon surgery  2010   Past Medical History  Diagnosis Date  . Cancer of bladder 2010  . Cancer of prostate 1994  . Depression   . Chronic kidney disease   . Hypertension   . TIA (transient ischemic attack) 2008  . Peripheral vascular disease   . PVD (peripheral vascular disease) 02/21/2013   BP 135/57  Pulse 62  Resp 16  Ht 5\' 11"  (1.803 m)  Wt 150 lb (68.04 kg)  BMI 20.93 kg/m2  SpO2 98%     Review of Systems  Gastrointestinal: Positive for constipation.  Genitourinary: Positive for difficulty urinating.  Musculoskeletal: Positive for myalgias, arthralgias and gait problem.  Neurological: Positive for weakness and numbness.  Psychiatric/Behavioral: The patient is nervous/anxious.   All other systems reviewed and are negative.       Objective:   Physical Exam  Right upper extremity Ashworth grade 2 spasticity the biceps wrist flexors finger flexors. Her strength 2 minus at the finger extensors and flexors 2 minus at  the biceps triceps deltoids. 2 minus knee extensor strength 0 ankle dorsiflexor plantar flexor left side normal strength  Sensory intact to light touch on the right side.  Did not assessed ambulation. No hemiwalker.  Right AFO Right wrist splint      Assessment & Plan:  1. 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 Continue physical therapy and occupational therapy Consider Zanaflex or baclofen however patient drowsy, wife is concerned  about worsening Discussed Botox. Will consider this. information given

## 2013-04-21 ENCOUNTER — Emergency Department (HOSPITAL_COMMUNITY): Payer: No Typology Code available for payment source

## 2013-04-21 ENCOUNTER — Encounter (HOSPITAL_COMMUNITY): Payer: Self-pay

## 2013-04-21 ENCOUNTER — Emergency Department (HOSPITAL_COMMUNITY)
Admission: EM | Admit: 2013-04-21 | Discharge: 2013-04-21 | Disposition: A | Discharge status: Home / Self Care | Payer: No Typology Code available for payment source | Attending: Emergency Medicine | Admitting: Emergency Medicine

## 2013-04-21 DIAGNOSIS — Z79899 Other long term (current) drug therapy: Secondary | ICD-10-CM | POA: Insufficient documentation

## 2013-04-21 DIAGNOSIS — M7989 Other specified soft tissue disorders: Secondary | ICD-10-CM | POA: Insufficient documentation

## 2013-04-21 DIAGNOSIS — N189 Chronic kidney disease, unspecified: Secondary | ICD-10-CM | POA: Insufficient documentation

## 2013-04-21 DIAGNOSIS — I129 Hypertensive chronic kidney disease with stage 1 through stage 4 chronic kidney disease, or unspecified chronic kidney disease: Secondary | ICD-10-CM | POA: Insufficient documentation

## 2013-04-21 DIAGNOSIS — Z8673 Personal history of transient ischemic attack (TIA), and cerebral infarction without residual deficits: Secondary | ICD-10-CM | POA: Insufficient documentation

## 2013-04-21 DIAGNOSIS — G8190 Hemiplegia, unspecified affecting unspecified side: Secondary | ICD-10-CM | POA: Insufficient documentation

## 2013-04-21 DIAGNOSIS — Z7902 Long term (current) use of antithrombotics/antiplatelets: Secondary | ICD-10-CM | POA: Insufficient documentation

## 2013-04-21 DIAGNOSIS — Z87891 Personal history of nicotine dependence: Secondary | ICD-10-CM | POA: Insufficient documentation

## 2013-04-21 DIAGNOSIS — R52 Pain, unspecified: Secondary | ICD-10-CM | POA: Insufficient documentation

## 2013-04-21 DIAGNOSIS — I69998 Other sequelae following unspecified cerebrovascular disease: Secondary | ICD-10-CM | POA: Insufficient documentation

## 2013-04-21 DIAGNOSIS — Z8551 Personal history of malignant neoplasm of bladder: Secondary | ICD-10-CM | POA: Insufficient documentation

## 2013-04-21 DIAGNOSIS — Z8546 Personal history of malignant neoplasm of prostate: Secondary | ICD-10-CM | POA: Insufficient documentation

## 2013-04-21 DIAGNOSIS — M25531 Pain in right wrist: Secondary | ICD-10-CM

## 2013-04-21 DIAGNOSIS — M25539 Pain in unspecified wrist: Secondary | ICD-10-CM | POA: Insufficient documentation

## 2013-04-21 LAB — CBC WITH DIFFERENTIAL/PLATELET
Eosinophils Absolute: 0.9 10*3/uL — ABNORMAL HIGH (ref 0.0–0.7)
HCT: 36.3 % — ABNORMAL LOW (ref 39.0–52.0)
Hemoglobin: 12.7 g/dL — ABNORMAL LOW (ref 13.0–17.0)
Lymphs Abs: 1.5 10*3/uL (ref 0.7–4.0)
MCH: 30.9 pg (ref 26.0–34.0)
MCHC: 35 g/dL (ref 30.0–36.0)
Monocytes Absolute: 1 10*3/uL (ref 0.1–1.0)
Monocytes Relative: 10 % (ref 3–12)
Neutro Abs: 6.9 10*3/uL (ref 1.7–7.7)
Neutrophils Relative %: 67 % (ref 43–77)
RBC: 4.11 MIL/uL — ABNORMAL LOW (ref 4.22–5.81)

## 2013-04-21 LAB — POCT I-STAT, CHEM 8
BUN: 38 mg/dL — ABNORMAL HIGH (ref 6–23)
Chloride: 109 mEq/L (ref 96–112)
Creatinine, Ser: 2.8 mg/dL — ABNORMAL HIGH (ref 0.50–1.35)
Glucose, Bld: 94 mg/dL (ref 70–99)
Potassium: 4.1 mEq/L (ref 3.5–5.1)

## 2013-04-21 MED ORDER — OXYCODONE-ACETAMINOPHEN 5-325 MG PO TABS: 2.0000 | ORAL_TABLET | ORAL | Status: DC | PRN

## 2013-04-21 MED ORDER — ACETAMINOPHEN 500 MG PO TABS
1000.0000 mg | ORAL_TABLET | Freq: Once | ORAL | Status: AC
  Administered 2013-04-21: 1000 mg via ORAL
  Filled 2013-04-21: qty 2

## 2013-04-21 NOTE — ED Notes (Signed)
Assisted pt in using the urinal. 

## 2013-04-21 NOTE — ED Provider Notes (Signed)
CSN: [id number]     Arrival date & time 04/21/13  1427 History   First MD Initiated Contact with Patient 04/21/13 1432     Chief Complaint  Patient presents with  . Extremity Pain   (Consider location/radiation/quality/duration/timing/severity/associated sxs/prior Treatment) HPI Comments: Patient presents with swelling and pain to his right upper extremity. He also has some mild swelling of his right leg. He recently had a stroke in July of this year and was sent to rehabilitation center following that. He was discharged at the end of August from the rehabilitation center. He is currently living at home. He has right side hemiparesis from the stroke. He states since he left the hospital in August he has noted some swelling in his right arm and right leg. His right arm is bothering him the most with pain in his wrist. He is not sure if he fell on it. He hasn't had a known fevers. He denies any nausea or vomiting. He denies a chest pain or shortness of breath.  Patient is a 72 y.o. male presenting with extremity pain.  Extremity Pain Pertinent negatives include no chest pain, no abdominal pain, no headaches and no shortness of breath.    Past Medical History  Diagnosis Date  . Cancer of bladder 2010  . Cancer of prostate 1994  . Depression   . Chronic kidney disease   . Hypertension   . TIA (transient ischemic attack) 2008  . Peripheral vascular disease   . PVD (peripheral vascular disease) 02/21/2013   Past Surgical History  Procedure Laterality Date  . Abdominal aortic aneurysm repair  1995  . Prostate surgery  1994  . Colon surgery  2010   History reviewed. No pertinent family history. History  Substance Use Topics  . Smoking status: Former Smoker -- 1.00 packs/day for 60 years    Quit date: 02/20/2013  . Smokeless tobacco: Never Used  . Alcohol Use: No    Review of Systems  Constitutional: Negative for fever, chills, diaphoresis and fatigue.  HENT: Negative for congestion,  rhinorrhea and sneezing.   Eyes: Negative.   Respiratory: Negative for cough, chest tightness and shortness of breath.   Cardiovascular: Positive for leg swelling. Negative for chest pain.  Gastrointestinal: Negative for nausea, vomiting, abdominal pain, diarrhea and blood in stool.  Genitourinary: Negative for frequency, hematuria, flank pain and difficulty urinating.  Musculoskeletal: Positive for arthralgias. Negative for back pain.  Skin: Negative for rash.  Neurological: Negative for dizziness, speech difficulty, weakness, numbness and headaches.    Allergies  Other and Plaquenil  Home Medications   Current Outpatient Rx  Name  Route  Sig  Dispense  Refill  . ALPRAZolam (XANAX) 0.25 MG tablet   Oral   Take 1 tablet (0.25 mg total) by mouth 2 (two) times daily as needed for anxiety.   60 tablet   0   . amLODipine (NORVASC) 10 MG tablet   Oral   Take 1 tablet (10 mg total) by mouth See admin instructions. Takes 1 tablet every day at noon and a 2nd tablet 2-3 times week (random days) in the evening   30 tablet   1   . atorvastatin (LIPITOR) 20 MG tablet   Oral   Take 1 tablet (20 mg total) by mouth daily at 6 PM.   30 tablet   1   . cloNIDine (CATAPRES) 0.1 MG tablet   Oral   Take 1 tablet (0.1 mg total) by mouth 3 (three) times daily.  60 tablet   11   . clopidogrel (PLAVIX) 75 MG tablet   Oral   Take 1 tablet (75 mg total) by mouth daily with breakfast.   30 tablet   1   . escitalopram (LEXAPRO) 5 MG tablet   Oral   Take 1 tablet (5 mg total) by mouth daily.   30 tablet   1   . flavoxATE (URISPAS) 100 MG tablet   Oral   Take 100 mg by mouth 3 (three) times daily.         . metoprolol tartrate (LOPRESSOR) 12.5 mg TABS tablet   Oral   Take 0.5 tablets (12.5 mg total) by mouth 2 (two) times daily.   60 tablet   1   . zolpidem (AMBIEN) 5 MG tablet   Oral   Take 1 tablet (5 mg total) by mouth at bedtime.   30 tablet   0   .  oxyCODONE-acetaminophen (PERCOCET) 5-325 MG per tablet   Oral   Take 2 tablets by mouth every 4 (four) hours as needed for pain.   20 tablet   0    BP 146/95  Pulse 64  Temp(Src) 97.8 F (36.6 C) (Oral)  Resp 18  Ht 5\' 11"  (1.803 m)  Wt 145 lb (65.772 kg)  BMI 20.23 kg/m2  SpO2 94% Physical Exam  Constitutional: He is oriented to person, place, and time. He appears well-developed and well-nourished.  HENT:  Head: Normocephalic and atraumatic.  Eyes: Pupils are equal, round, and reactive to light.  Neck: Normal range of motion. Neck supple.  Cardiovascular: Normal rate, regular rhythm and normal heart sounds.   Pulmonary/Chest: Effort normal and breath sounds normal. No respiratory distress. He has no wheezes. He has no rales. He exhibits no tenderness.  Abdominal: Soft. Bowel sounds are normal. There is no tenderness. There is no rebound and no guarding.  Musculoskeletal: Normal range of motion. He exhibits edema.  Patient has weakness and contractures of his right upper and lower extremities. He has some contractures of the right hand. He has pain and swelling over the left wrist in the left arm. His pain on palpation is located only in the wrist. He has no pain in the elbow or the shoulder. He has some mild warmth and erythema over the wrist and forearm. There is no streaking up the arm. There is no wounds. There is no abscesses. Radial pulses intact. He also has some mild edema of both lower extremities however they'll rate is greater than the left. There is no calf tenderness. Pedal pulses are intact. He has no warmth or erythema to the legs.  Lymphadenopathy:    He has no cervical adenopathy.  Neurological: He is alert and oriented to person, place, and time.  Skin: Skin is warm and dry. No rash noted.  Psychiatric: He has a normal mood and affect.    ED Course  Procedures (including critical care time) Labs Review Results for orders placed during the hospital encounter of  04/21/13  CBC WITH DIFFERENTIAL      Result Value Range   WBC 10.3  4.0 - 10.5 K/uL   RBC 4.11 (*) 4.22 - 5.81 MIL/uL   Hemoglobin 12.7 (*) 13.0 - 17.0 g/dL   HCT 16.1 (*) 09.6 - 04.5 %   MCV 88.3  78.0 - 100.0 fL   MCH 30.9  26.0 - 34.0 pg   MCHC 35.0  30.0 - 36.0 g/dL   RDW 40.9  81.1 - 91.4 %  Platelets 237  150 - 400 K/uL   Neutrophils Relative % 67  43 - 77 %   Neutro Abs 6.9  1.7 - 7.7 K/uL   Lymphocytes Relative 15  12 - 46 %   Lymphs Abs 1.5  0.7 - 4.0 K/uL   Monocytes Relative 10  3 - 12 %   Monocytes Absolute 1.0  0.1 - 1.0 K/uL   Eosinophils Relative 8 (*) 0 - 5 %   Eosinophils Absolute 0.9 (*) 0.0 - 0.7 K/uL   Basophils Relative 0  0 - 1 %   Basophils Absolute 0.0  0.0 - 0.1 K/uL  POCT I-STAT, CHEM 8      Result Value Range   Sodium 140  135 - 145 mEq/L   Potassium 4.1  3.5 - 5.1 mEq/L   Chloride 109  96 - 112 mEq/L   BUN 38 (*) 6 - 23 mg/dL   Creatinine, Ser 4.54 (*) 0.50 - 1.35 mg/dL   Glucose, Bld 94  70 - 99 mg/dL   Calcium, Ion 0.98  1.19 - 1.30 mmol/L   TCO2 23  0 - 100 mmol/L   Hemoglobin 12.6 (*) 13.0 - 17.0 g/dL   HCT 14.7 (*) 82.9 - 56.2 %   Dg Wrist Complete Right  04/21/2013   CLINICAL DATA:  Wrist pain  EXAM: RIGHT WRIST - COMPLETE 3+ VIEW  COMPARISON:  None  FINDINGS: Moderate to advanced degenerative change in the radiocarpal joint with joint space narrowing and cystic change in the lunate and navicular. Mild chondrocalcinosis. Negative for fracture.  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 Negative for fracture.   Electronically Signed   By: Marlan Palau M.D.   On: 04/21/2013 15:50    Date: 04/21/2013  Rate: 62  Rhythm: normal sinus rhythm  QRS Axis: normal  Intervals: normal  ST/T Wave abnormalities: ST depressions inferiorly and ST depressions laterally  Conduction Disutrbances:none  Narrative Interpretation:   Old EKG Reviewed: unchanged     MDM   1. Wrist pain, acute, right    Patient presents with  right wrist pain. I talked to his wife more about it she says this is been bothering him since he was discharged from the hospital in August. It's been gone about 3 or 4 weeks now. She states it looks unchanged from out of has. There's no increased redness or swelling. He has been complaining of the pain more over the last week. He talked to the occupational therapist about it and there trying to get a sling for him to use. They feel that a lot of the edema is due to his right side being dependent and him being over slightly more to the right from his stroke. I agree with this assessment. There is no signs of DVT. He does have some mild warmth and erythema around the wrist and forearm. I feel this is likely inflammatory. There's no noticeable effusion. There is new diffuse tenderness be more characteristic of a septic arthritis. He seems to be more tender on the ulnar side of the wrist. Kyphosis likely due to arthritis changes along with the dependent edema. He was given a sling to use. He was given a prescription for Percocet to use at home. I gave him referral to follow up with orthopedics if his symptoms are not improving. I reviewed his labs his creatinine is at baseline.   Rolan Bucco, MD 04/21/13 [phone]

## 2013-04-21 NOTE — ED Notes (Signed)
[facility] contacted for transport 

## 2013-04-21 NOTE — ED Notes (Signed)
Per [facility], pt from home for pain and swelling to right arm and leg. Pt had a stroke 7 months ago with deficits and paralysis to the right side. The wife and patient noticed the right arm has been swelling more and has been more tender over the past week. BP elevated with EMS.

## 2013-04-21 NOTE — Progress Notes (Signed)
Received TC from [employer], CSW CIR re: pt's wife needing SNF list.  ED CSW provided pt's wife with list and offered emotional support.

## 2013-04-21 NOTE — ED Notes (Signed)
Dr. Belfi at the bedside.  

## 2013-04-21 NOTE — Progress Notes (Signed)
VASCULAR LAB PRELIMINARY  PRELIMINARY  PRELIMINARY  PRELIMINARY  Right upper extremity venous duplex completed.    Preliminary report:  Right:  No evidence of DVT or superficial thrombosis.    Ishmael Berkovich, RVT 04/21/2013, 4:37 PM

## 2013-04-22 ENCOUNTER — Telehealth: Payer: Self-pay

## 2013-04-22 DIAGNOSIS — M79609 Pain in unspecified limb: Secondary | ICD-10-CM

## 2013-04-22 DIAGNOSIS — M7989 Other specified soft tissue disorders: Secondary | ICD-10-CM

## 2013-04-22 NOTE — Telephone Encounter (Signed)
Patients wife called concerned about patients right hand.  She says it is puffy and sensitive to everything.  The patient actually went to the hospital because it was bothering him.  She would like to know if he could get neurotin or lyrica.  The patient is not interested in narcotics.  Please advise.

## 2013-04-22 NOTE — Telephone Encounter (Signed)
Please call in Prednisone 10mg  # 30  Take 4 tablets once a day for 3 days Take 3 tablets once a day for 3 days Take 2 tablets once a day for 3 days Take 1 tablet once a day for 3 days No refill

## 2013-04-23 ENCOUNTER — Other Ambulatory Visit: Payer: Self-pay

## 2013-04-23 MED ORDER — PREDNISONE 10 MG PO TABS: 10.0000 mg | ORAL_TABLET | ORAL | Status: DC

## 2013-04-23 NOTE — Telephone Encounter (Signed)
Patients wife informed that prednisone was sent to pharmacy to help with patients hand.

## 2013-04-30 ENCOUNTER — Telehealth: Payer: Self-pay

## 2013-04-30 NOTE — Telephone Encounter (Signed)
Bray[facility] C[facility] called to get verbal orders to have skilled nursing assess patient for needs.  Verbal order left on [employer].

## 2013-05-03 ENCOUNTER — Telehealth: Payer: Self-pay

## 2013-05-03 NOTE — Telephone Encounter (Signed)
Requesting a refill on Xanax and Ambien. Stated that patient has a Appt with a new PCP later this week.

## 2013-05-04 ENCOUNTER — Ambulatory Visit (INDEPENDENT_AMBULATORY_CARE_PROVIDER_SITE_OTHER): Payer: Medicare Other | Admitting: Family

## 2013-05-04 ENCOUNTER — Encounter: Payer: Self-pay | Admitting: Family

## 2013-05-04 VITALS — BP 152/84 | HR 62 | Wt 145.0 lb

## 2013-05-04 DIAGNOSIS — F329 Major depressive disorder, single episode, unspecified: Secondary | ICD-10-CM

## 2013-05-04 DIAGNOSIS — M6281 Muscle weakness (generalized): Secondary | ICD-10-CM

## 2013-05-04 DIAGNOSIS — B351 Tinea unguium: Secondary | ICD-10-CM

## 2013-05-04 DIAGNOSIS — F411 Generalized anxiety disorder: Secondary | ICD-10-CM

## 2013-05-04 DIAGNOSIS — R531 Weakness: Secondary | ICD-10-CM

## 2013-05-04 DIAGNOSIS — Z8673 Personal history of transient ischemic attack (TIA), and cerebral infarction without residual deficits: Secondary | ICD-10-CM

## 2013-05-04 MED ORDER — ZOLPIDEM TARTRATE 5 MG PO TABS: 5.0000 mg | ORAL_TABLET | Freq: Every day | ORAL | Status: DC

## 2013-05-04 MED ORDER — ALPRAZOLAM 0.25 MG PO TABS: 0.2500 mg | ORAL_TABLET | Freq: Two times a day (BID) | ORAL | Status: DC | PRN

## 2013-05-04 MED ORDER — ESCITALOPRAM OXALATE 10 MG PO TABS: 10.0000 mg | ORAL_TABLET | Freq: Every day | ORAL | Status: DC

## 2013-05-04 NOTE — Patient Instructions (Addendum)
Stroke (Cerebrovascular Accident) A stroke (cerebrovascular accident, CVA) means you have a brain injury from blocked circulation or bleeding in the brain. Blocked circulation usually comes from a clot. RISK FACTORS  High blood pressure (hypertension).   High cholesterol.   Diabetes.   Heart disease.   The buildup of fatty deposits in the blood vessels (peripheral artery disease or atherosclerosis).   An abnormal heart rhythm (atrial fibrillation).   Obesity.   Smoking.   Taking oral contraceptives (especially in combination with smoking).   Physical inactivity.   A diet high in fats, salt (sodium), and calories.   Alcohol use.   Use of illegal drugs (especially cocaine and methamphetamine).   Being a male.   Being an Tree surgeon.   Age over 35.   Family history of stroke.   Previous history of blood clots, a "warning stroke" (transient ischemic attack, TIA), or heart attack.   Sickle cell disease.  SYMPTOMS  The symptoms of a stroke depend on the part of the brain that is affected. It is important to seek treatment within 4 hours of the start of symptoms because you may receive a "clot dissolving" medication that cannot be given after that time. Even if you don't know when your symptoms began, get treatment as soon as possible. Symptoms of a stroke may progress or change over the first several days. Symptoms may include:  Sudden weakness or numbness of the face, arm, or leg, especially on one side of the body.   Sudden confusion.   Trouble speaking (aphasia) or understanding.   Sudden trouble seeing in one or both eyes.   Sudden trouble walking.   Dizziness.   Loss of balance or coordination.   Sudden severe headache with no known cause.  HOME CARE INSTRUCTIONS   Medicines: Aspirin and blood thinners may be used to prevent another stroke. Blood thinners need to be used exactly as instructed. Medicines may also be used to control risk factors for a  stroke. Be sure you understand all your medicine instructions.   Diet: Certain diets may be prescribed to address high blood pressure, high cholesterol, diabetes, or obesity. A diet that includes 5 or more servings of fruits and vegetables a day may reduce the risk of stroke. Foods may need to be a special consistency (soft or pureed), or small bites may need to be taken in order to avoid aspirating or choking.   Maintain a healthy weight.   Stay physically active. It is recommended that you get at least 30 minutes of activity on most or all days.   Do not smoke.   Limit alcohol use.   Stop drug abuse.   Home safety: A safe home environment is important to reduce the risk of falls. Your caregiver may arrange for specialists to evaluate your home. Having grab bars in the bedroom and bathroom is often important. Your caregiver may arrange for special equipment to be used at home, such as raised toilets and a seat for the shower.   Physical, occupational, and speech therapy: Ongoing therapy may be needed to maximize your recovery after a stroke. If you have been advised to use a walker or a cane, use it at all times. Be sure to keep your therapy appointments.   Follow all instructions for follow-up with your caregiver. This is VERY important. This includes any referrals, physical therapy, rehabilitation, and laboratory tests. Proper treatment also prevents another stroke from occurring.  SEEK IMMEDIATE MEDICAL CARE IF:   You have  sudden weakness or numbness of the face, arm, or leg, especially on one side of the body.   You have sudden confusion.   You have trouble speaking (aphasia) or understanding.   You have sudden trouble seeing in one or both eyes.   You have sudden trouble walking.   You have dizziness.   You have loss of balance or coordination.   You have a sudden, severe headache with no known cause.   You have a fever.   You are coughing or have difficulty breathing.    You have new chest pain, angina, or an irregular heartbeat.  Any of these symptoms may represent a serious problem that is an emergency. Do not wait to see if the symptoms will go away. Get medical help at once. Call your local emergency services (911 in [place] Do not drive yourself to the hospital. Document Released: 07/22/2005 Document Revised: 02/04/2011 Document Reviewed: 12/20/2009 [facility] Patient Information 2012 [place], [place].

## 2013-05-04 NOTE — Progress Notes (Signed)
Subjective:    Patient ID: Kevin Escobar, male    DOB: [birth year], 72 y.o.   MRN: [id number]  HPI 72 year old white male, new patient to the practice and to be established. He has a history of a CVA 02/20/2013. As a result he has residual right-sided weakness. He has a history of peripheral vascular disease, hypertension, emphysema, chronic kidney disease, depression and hypokalemia. His wife is concerned if these become more depressed over the last several months after the stroke. Is feeling discouraged about getting better. He is undergoing physical therapy and occupational therapy that he reports is going well.    Review of Systems  HENT: Negative.   Respiratory: Negative.   Cardiovascular: Negative.   Endocrine: Negative.   Genitourinary: Negative.   Allergic/Immunologic: Negative.   Neurological: Positive for weakness.       Right side weakness   Hematological: Negative.   Psychiatric/Behavioral: Positive for agitation. Negative for suicidal ideas and sleep disturbance. The patient is nervous/anxious.        Depression   Past Medical History  Diagnosis Date  . Cancer of bladder 2010  . Cancer of prostate 1994  . Depression   . Chronic kidney disease   . Hypertension   . TIA (transient ischemic attack) 2008  . Peripheral vascular disease   . PVD (peripheral vascular disease) 02/21/2013    History   Social History  . Marital Status: Married    Spouse Name: N/A    Number of Children: N/A  . Years of Education: N/A   Occupational History  . Not on file.   Social History Main Topics  . Smoking status: Former Smoker -- 1.00 packs/day for 60 years    Quit date: 02/20/2013  . Smokeless tobacco: Never Used  . Alcohol Use: No  . Drug Use: No  . Sexual Activity: Not on file   Other Topics Concern  . Not on file   Social History Narrative  . No narrative on file    Past Surgical History  Procedure Laterality Date  . Abdominal aortic aneurysm repair  1995  .  Prostate surgery  1994  . Colon surgery  2010    No family history on file.  Allergies  Allergen Reactions  . Other Rash    "Cheerios cause me to break out--bumps all over my chest"  . Plaquenil [Hydroxychloroquine Sulfate] Rash    Severe rash    Current Outpatient Prescriptions on File Prior to Visit  Medication Sig Dispense Refill  . amLODipine (NORVASC) 10 MG tablet Take 1 tablet (10 mg total) by mouth See admin instructions. Takes 1 tablet every day at noon and a 2nd tablet 2-3 times week (random days) in the evening  30 tablet  1  . atorvastatin (LIPITOR) 20 MG tablet Take 1 tablet (20 mg total) by mouth daily at 6 PM.  30 tablet  1  . cloNIDine (CATAPRES) 0.1 MG tablet Take 1 tablet (0.1 mg total) by mouth 3 (three) times daily.  60 tablet  11  . clopidogrel (PLAVIX) 75 MG tablet Take 1 tablet (75 mg total) by mouth daily with breakfast.  30 tablet  1  . flavoxATE (URISPAS) 100 MG tablet Take 100 mg by mouth 3 (three) times daily.      . metoprolol tartrate (LOPRESSOR) 12.5 mg TABS tablet Take 0.5 tablets (12.5 mg total) by mouth 2 (two) times daily.  60 tablet  1   No current facility-administered medications on file prior to visit.  BP 152/84  Pulse 62  Wt 145 lb (65.772 kg)  BMI 20.23 kg/m2chart    Objective:   Physical Exam  Constitutional: He is oriented to person, place, and time. He appears well-developed and well-nourished.  HENT:  Right Ear: External ear normal.  Left Ear: External ear normal.  Nose: Nose normal.  Mouth/Throat: Oropharynx is clear and moist.  Neck: Normal range of motion. Neck supple.  Cardiovascular: Normal rate, regular rhythm and normal heart sounds.   Pulmonary/Chest: Effort normal and breath sounds normal.  Abdominal: Soft. Bowel sounds are normal.  Musculoskeletal:  Wheelchair-bound. Right-sided weakness. Right hand contracted.  Neurological: He is alert and oriented to person, place, and time. He displays normal reflexes. No  cranial nerve deficit. Coordination abnormal.  Skin: Skin is warm.  Psychiatric: He has a normal mood and affect.          Assessment & Plan:  Assessment:  1. History of CVA 2. Right Side Weakness 3. PVD 4. Hypertension 5. Emphysema 6. Depression  Plan: Continue current meds. Continue PT and OT. Meds refilled. Increase Lexapro to 10mg . Recheck in 4 weeks. Labs sent,

## 2013-05-07 ENCOUNTER — Telehealth: Payer: Self-pay

## 2013-05-07 NOTE — Telephone Encounter (Signed)
FYI:: Desi RN from [facility] called to inform you that patient has refused further skilled nursing visits as of 05/07/13. Patient will continue with PT and OT.

## 2013-05-10 NOTE — Telephone Encounter (Signed)
noted 

## 2013-05-14 ENCOUNTER — Ambulatory Visit (HOSPITAL_BASED_OUTPATIENT_CLINIC_OR_DEPARTMENT_OTHER): Payer: Medicare Other | Admitting: Physical Medicine & Rehabilitation

## 2013-05-14 ENCOUNTER — Encounter: Payer: Self-pay | Admitting: Physical Medicine & Rehabilitation

## 2013-05-14 ENCOUNTER — Encounter: Payer: No Typology Code available for payment source | Attending: Physical Medicine & Rehabilitation

## 2013-05-14 VITALS — BP 148/65 | HR 64 | Resp 16 | Ht 71.0 in | Wt 150.0 lb

## 2013-05-14 DIAGNOSIS — I69959 Hemiplegia and hemiparesis following unspecified cerebrovascular disease affecting unspecified side: Secondary | ICD-10-CM | POA: Insufficient documentation

## 2013-05-14 DIAGNOSIS — G8110 Spastic hemiplegia affecting unspecified side: Secondary | ICD-10-CM | POA: Insufficient documentation

## 2013-05-14 MED ORDER — TIZANIDINE HCL 4 MG PO TABS: 2.0000 mg | ORAL_TABLET | Freq: Three times a day (TID) | ORAL | Status: DC | PRN

## 2013-05-14 NOTE — Progress Notes (Signed)
Subjective:    Patient ID: Kevin Escobar, male    DOB: 14-Jan-1941, 72 y.o.   MRN: [id number]  HPI Kevin Escobar is a 72 y.o. male with a past medical history significant for HTN, TIA, CKD, lupus, renal artery stenosis s/p stent placement, AAA repair in 1995, prostate and bladder cancer; admitted on 02/21/13 with progressive difficulty walking due to right sided weakness and fall as well and 2 episodes of transient language impairment for >24 hours PTA. MRI/MRA brain with acute left posterior limb infarct and no IC stenosis. 2D echo with EF 60-65% and No wall abnormality. Carotid dopplers with mild soft plaque L-ICA origin. Patient with neurological worsening on 07/21 with vomiting/?aspiration event. He was placed on bedrest and follow up CCT stable. 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 He continues on D1 nectar liquids due to aspiration of thins CIR stay  Admit date: 02/26/2013  Discharge date: 03/27/2013  Home health PT/OT  No falls since last visit  Pain Inventory Average Pain 5 Pain Right Now 0 My pain is dull and stabbing  In the last 24 hours, has pain interfered with the following? General activity 0 Relation with others 5 Enjoyment of life 4 What TIME of day is your pain at its worst? morning Sleep (in general) Poor  Pain is worse with: unsure Pain improves with: rest Relief from Meds: 0  Mobility how many minutes can you walk? 1 do you drive?  no use a wheelchair transfers alone Do you have any goals in this area?  yes  Function retired I need assistance with the following:  dressing, bathing and toileting  Neuro/Psych bladder control problems confusion depression anxiety  Prior Studies Any changes since last visit?  no  Physicians involved in your care Any changes since last visit?  no   History reviewed. No pertinent family history. History   Social History  . Marital Status:  Married    Spouse Name: N/A    Number of Children: N/A  . Years of Education: N/A   Social History Main Topics  . Smoking status: Former Smoker -- 1.00 packs/day for 60 years    Quit date: 02/20/2013  . Smokeless tobacco: Never Used  . Alcohol Use: No  . Drug Use: No  . Sexual Activity: None   Other Topics Concern  . None   Social History Narrative  . None   Past Surgical History  Procedure Laterality Date  . Abdominal aortic aneurysm repair  1995  . Prostate surgery  1994  . Colon surgery  2010   Past Medical History  Diagnosis Date  . Cancer of bladder 2010  . Cancer of prostate 1994  . Depression   . Chronic kidney disease   . Hypertension   . TIA (transient ischemic attack) 2008  . Peripheral vascular disease   . PVD (peripheral vascular disease) 02/21/2013   BP 148/65  Pulse 64  Resp 16  Ht 5\' 11"  (1.803 m)  Wt 150 lb (68.04 kg)  BMI 20.93 kg/m2  SpO2 99%     Review of Systems  Genitourinary: Positive for difficulty urinating.  Musculoskeletal: Positive for gait problem.  Psychiatric/Behavioral: Positive for confusion and dysphoric mood. The patient is nervous/anxious.   All other systems reviewed and are negative.       Objective:   Physical Exam  Right upper extremity Ashworth grade 3 spasticity the biceps wrist flexors finger flexors.   strength 2 minus at  the finger extensors and flexors  2 minus at the biceps triceps deltoids. 2 minus knee extensor strength 0 ankle dorsiflexor plantar flexor left side normal strength  Pain with extension of fingers passively Sensory intact to light touch on the right side.  Did not assessed ambulation.  No hemiwalker.  Right AFO  Right wrist splint       Assessment & Plan:  1. 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 Will trial Zanaflex 2 mg 3 times per day Return to clinic one month if not much better we'll discuss  Botox injection  Continue physical therapy and occupational therapy

## 2013-05-14 NOTE — Patient Instructions (Signed)
dont think Voltaren gel will help much with hand pain

## 2013-05-19 ENCOUNTER — Telehealth: Payer: Self-pay | Admitting: Emergency Medicine

## 2013-05-19 NOTE — Telephone Encounter (Signed)
Called to ck status of pt and to see if he would like to f/u w/ Dr Archer Asa post RAS.  S/w pt wife and she said he is still sensitive right now and trying to perform an [place] would not be tolerated right now.   We will call back in a month to ck status again.

## 2013-05-20 ENCOUNTER — Telehealth: Payer: Self-pay | Admitting: Family

## 2013-05-20 ENCOUNTER — Ambulatory Visit (INDEPENDENT_AMBULATORY_CARE_PROVIDER_SITE_OTHER): Payer: PRIVATE HEALTH INSURANCE | Admitting: Podiatry

## 2013-05-20 ENCOUNTER — Encounter: Payer: Self-pay | Admitting: Podiatry

## 2013-05-20 VITALS — BP 173/87 | HR 60 | Resp 12 | Ht 71.0 in | Wt 138.0 lb

## 2013-05-20 DIAGNOSIS — M79609 Pain in unspecified limb: Secondary | ICD-10-CM

## 2013-05-20 DIAGNOSIS — B351 Tinea unguium: Secondary | ICD-10-CM

## 2013-05-20 DIAGNOSIS — M2010 Hallux valgus (acquired), unspecified foot: Secondary | ICD-10-CM

## 2013-05-20 MED ORDER — AMLODIPINE BESYLATE 10 MG PO TABS: ORAL_TABLET | ORAL | Status: DC

## 2013-05-20 NOTE — Telephone Encounter (Signed)
Pt is calling to request a refill of his amLODipine (NORVASC) 10 MG tablet. She would like it sent to [facility] on [address]. Please assist.

## 2013-05-20 NOTE — Progress Notes (Signed)
N  SORE L    B/L TOENAILS, D  LONG TERM O   SLOWLY C   WORSE A   PRESSURE T   N/A

## 2013-05-20 NOTE — Progress Notes (Signed)
Subjective:     Patient ID: Kevin Escobar, male   DOB: 19-Dec-1940, 72 y.o.   MRN: [id number]  HPI patient presents with caregiver skating I have all full nails that I cannot cut them they gets sore has had a stroke and is in a wheelchair and has no hand strength for leg strength   Review of Systems  All other systems reviewed and are negative.       Objective:   Physical Exam  Nursing note and vitals reviewed. Musculoskeletal: Normal range of motion.  Neurological: He is alert.  Skin: Skin is warm.   presses PT +1/4 right  left DP +1/4 right was 1 /4 bl left muscle strength was reduced secondary to stroke bilateral.  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

## 2013-05-24 ENCOUNTER — Telehealth: Payer: Self-pay

## 2013-05-24 DIAGNOSIS — G8110 Spastic hemiplegia affecting unspecified side: Secondary | ICD-10-CM

## 2013-05-24 NOTE — Telephone Encounter (Signed)
Called requesting a recommendation to be changed from home health care to outpatient care at [facility].

## 2013-05-24 NOTE — Telephone Encounter (Signed)
Patient is requesting physical therapy and occupational therapy.  Orders placed.  Patient wife aware.

## 2013-05-25 ENCOUNTER — Telehealth: Payer: Self-pay | Admitting: *Deleted

## 2013-05-25 NOTE — Telephone Encounter (Signed)
appt scheduled

## 2013-05-25 NOTE — Telephone Encounter (Signed)
Bring patient in to see any provider. Will need 30 min.

## 2013-05-25 NOTE — Telephone Encounter (Signed)
They are getting ready to dc pt to out pt therapy.  Pt has progressively become more lethargic since first appt. After flu shot pt has declined.  Would like for pt to come in for an earlier appt to determine why is so lethargic.  They are concerned pt will not progress in outpt therapy if pt doesn't start feeling better.  pls advise

## 2013-05-25 NOTE — Telephone Encounter (Signed)
Please advise 

## 2013-05-31 ENCOUNTER — Telehealth: Payer: Self-pay | Admitting: Family

## 2013-05-31 ENCOUNTER — Ambulatory Visit (INDEPENDENT_AMBULATORY_CARE_PROVIDER_SITE_OTHER): Payer: Medicare Other | Admitting: Family

## 2013-05-31 ENCOUNTER — Encounter: Payer: Self-pay | Admitting: Family

## 2013-05-31 VITALS — BP 136/86 | HR 70 | Wt 142.0 lb

## 2013-05-31 DIAGNOSIS — I69959 Hemiplegia and hemiparesis following unspecified cerebrovascular disease affecting unspecified side: Secondary | ICD-10-CM

## 2013-05-31 DIAGNOSIS — F329 Major depressive disorder, single episode, unspecified: Secondary | ICD-10-CM

## 2013-05-31 DIAGNOSIS — I69351 Hemiplegia and hemiparesis following cerebral infarction affecting right dominant side: Secondary | ICD-10-CM

## 2013-05-31 DIAGNOSIS — G4700 Insomnia, unspecified: Secondary | ICD-10-CM

## 2013-05-31 DIAGNOSIS — I69998 Other sequelae following unspecified cerebrovascular disease: Secondary | ICD-10-CM

## 2013-05-31 DIAGNOSIS — IMO0002 Reserved for concepts with insufficient information to code with codable children: Secondary | ICD-10-CM

## 2013-05-31 LAB — POCT URINALYSIS DIPSTICK
Spec Grav, UA: 1.025
Urobilinogen, UA: 0.2

## 2013-05-31 LAB — BASIC METABOLIC PANEL
BUN: 32 mg/dL — ABNORMAL HIGH (ref 6–23)
CO2: 24 mEq/L (ref 19–32)
Chloride: 105 mEq/L (ref 96–112)
Creatinine, Ser: 2.8 mg/dL — ABNORMAL HIGH (ref 0.4–1.5)
Glucose, Bld: 94 mg/dL (ref 70–99)

## 2013-05-31 LAB — HEPATIC FUNCTION PANEL
ALT: 10 U/L (ref 0–53)
Bilirubin, Direct: 0.1 mg/dL (ref 0.0–0.3)
Total Protein: 6.7 g/dL (ref 6.0–8.3)

## 2013-05-31 MED ORDER — ESCITALOPRAM OXALATE 20 MG PO TABS: 20.0000 mg | ORAL_TABLET | Freq: Every day | ORAL | Status: DC

## 2013-05-31 NOTE — Patient Instructions (Signed)
Therapy after a Stroke, Adult Your brain cells need a steady supply of blood and oxygen to work normally. A stroke comes from a sudden end of blood flow to the brain. Your brain cells may start to die within minutes after blood flow is interrupted. The loss of brain function can cause problems with speech, vision, memory, or movement. The severity of the stroke depends on which part of your brain is affected and how big an area is harmed. There are two main types of stroke:  Ischemic stroke is caused by a blockage in arteries (blood vessels carrying oxygen rich blood) supplying the brain. This is the most common kind of stroke.  Hemorrhagic stroke is occurs when a blood vessel in your brain bursts and causes bleeding into your brain. A stroke can cause many types of problems. The treatment of stroke involves 3 stages. The 3 stages are prevention, treatment immediately following a stroke, and rehabilitation after a stroke. BEFORE THERAPY BEGINS A detailed exam by your caregiver helps outline what problems were caused by the stroke. Your caregiver may ask specialists about this. The specialists may include doctors, occupational therapists, physical therapists, and speech therapists. It is then possible to make a plan that best fits your needs. Your evaluation might include the following:  Your ability to do daily activities that require using muscles, coordination, vision, reasoning, memory, and problem solving. Interviews with you and your caregiver to understand what you could do and could not do before the stroke. Your ability to do personal self-care tasks, such as dressing, grooming, and eating.  Tests may be done to see if there are sensory and motor changes due to the stroke, especially in the hands and legs. TREATMENT   Your caregiver may start therapy right away depending on the type and severity of your stroke.  Medicines.  If a blood clot caused the stroke, you may be started on  medicines that can help to dissolve the clot and stop new clots from forming.  If bleeding caused the stroke, medicines might be used to lower blood pressure and reverse the effects of any blood thinners used at the time of the stroke.  Therapy after stroke is focused on getting function back and preventing another stroke. Therapy might include:  Physical therapy. This can include help with walking, sitting, lying down, and balance. It may also be designed to help prevent shortening of the muscles (contractures) and swelling (edema).  Occupational therapy. This therapy helps you to relearn skills needed for leading a normal life. These could include eating, using the restroom, dressing, and taking care of yourself. It helps to make you more independent.  Vision therapy. This can help you to retrain, strengthen, and improve your vision following a stroke.  Speech therapy. This can help to improve your speech and communication skills. It also teaches both you and your family members to cope with problems of being unable to communicate.  Cognitive therapy. This therapy can help with problems caused by lack of memory, attention, or concentration.  Psychological or psychiatric therapy. This can help you cope with problems of frustration and emotional problems that may develop after a stroke.  Blood pressure control. If your blood pressure is high, it is important to lower it to a normal pressure.  Smoking cessation. If you smoke, it is important to get help to stop smoking.  Medicines to lower cholesterol (antilipidemics) are sometimes prescribed to reduce the amount of fat (plaque) in the blood. The reduction of  plaque on artery walls can decrease the risk of a future stroke. HOME CARE INSTRUCTIONS   Follow a healthy diet as directed by your caregiver.  Monitor and control your blood sugar levels, if you are a person with diabetes.  Reduce the amount of sodium and fat in your diet.  Quit  smoking.  Exercise regularly, in moderation.  Take the necessary prescribed medicines as directed by your caregiver.  Continue your rehabilitation program as directed by your caregiver.  Get your blood pressure and cholesterol levels checked regularly. SEEK MEDICAL CARE IF:   You feel you are having problems with medicines prescribed.  You are not improving as expected. SEEK IMMEDIATE MEDICAL CARE IF:   You have sudden weakness or numbness of the face, arm, or leg, especially on one side of the body.  You have sudden confusion.  You have trouble speaking (aphasia) or understanding.  You have sudden trouble seeing in 1 or both eyes.  You have sudden trouble walking.  You have dizziness.  You have a loss of balance or coordination.  You have a sudden severe headache with no known cause.  You have an oral temperature above 102 F (38.9 C), not controlled by medicine.  You are coughing or have difficulty breathing.  You have new chest pain, angina, or an irregular heartbeat. ANY OF THESE SYMPTOMS MAY REPRESENT A SERIOUS PROBLEM THAT IS AN EMERGENCY. Do not wait to see if the symptoms will go away. Get medical help at once. Call your local emergency services (911 in the [place] DO NOT drive yourself to the hospital. Document Released: 08/11/2007 Document Revised: 10/14/2011 Document Reviewed: 08/11/2007 [facility] Patient Information 2014 [place], [place].

## 2013-05-31 NOTE — Progress Notes (Signed)
Subjective:    Patient ID: Kevin Escobar, male    DOB: 19-Oct-1940, 72 y.o.   MRN: [id number]  HPI 72 year old white male is in today with complaints of weakness and fatigue more recently over the last several weeks and after sustaining a stroke on 02/26/2013. He is undergoing home physical therapy. The physical therapist has become concerned that he is becoming more weak. He is due to start outpatient physical therapy next week. His wife believes that his mood still appears to be down and depressed. Patient reports having a mentally challenged daughter at home and it causes him a lot of stress. Wife reports that he is napping a lot during the day more so than usual.   Review of Systems  Constitutional: Positive for fatigue.  Respiratory: Negative.   Cardiovascular: Negative.   Gastrointestinal: Negative.   Musculoskeletal: Negative.   Skin: Negative.   Neurological: Positive for weakness.       Right eye weakness  Hematological: Negative.   Psychiatric/Behavioral: Negative.    Past Medical History  Diagnosis Date  . Cancer of bladder 2010  . Cancer of prostate 1994  . Depression   . Chronic kidney disease   . Hypertension   . TIA (transient ischemic attack) 2008  . Peripheral vascular disease   . PVD (peripheral vascular disease) 02/21/2013    History   Social History  . Marital Status: Married    Spouse Name: N/A    Number of Children: N/A  . Years of Education: N/A   Occupational History  . Not on file.   Social History Main Topics  . Smoking status: Former Smoker -- 1.00 packs/day for 60 years    Quit date: 02/20/2013  . Smokeless tobacco: Never Used  . Alcohol Use: No  . Drug Use: No  . Sexual Activity: Not on file   Other Topics Concern  . Not on file   Social History Narrative  . No narrative on file    Past Surgical History  Procedure Laterality Date  . Abdominal aortic aneurysm repair  1995  . Prostate surgery  1994  . Colon surgery  2010     No family history on file.  Allergies  Allergen Reactions  . Other Rash    "Cheerios cause me to break out--bumps all over my chest"  . Plaquenil [Hydroxychloroquine Sulfate] Rash    Severe rash    Current Outpatient Prescriptions on File Prior to Visit  Medication Sig Dispense Refill  . ALPRAZolam (XANAX) 0.25 MG tablet Take 1 tablet (0.25 mg total) by mouth 2 (two) times daily as needed for anxiety.  60 tablet  3  . amLODipine (NORVASC) 10 MG tablet Takes 1 tablet every day at noon and a 2nd tablet 2-3 times week (random days) in the evening  30 tablet  1  . atorvastatin (LIPITOR) 20 MG tablet Take 1 tablet (20 mg total) by mouth daily at 6 PM.  30 tablet  1  . cloNIDine (CATAPRES) 0.1 MG tablet Take 1 tablet (0.1 mg total) by mouth 3 (three) times daily.  60 tablet  11  . clopidogrel (PLAVIX) 75 MG tablet Take 1 tablet (75 mg total) by mouth daily with breakfast.  30 tablet  1  . flavoxATE (URISPAS) 100 MG tablet Take 100 mg by mouth 3 (three) times daily.      . metoprolol tartrate (LOPRESSOR) 12.5 mg TABS tablet Take 0.5 tablets (12.5 mg total) by mouth 2 (two) times daily.  60  tablet  1  . tiZANidine (ZANAFLEX) 4 MG tablet Take 0.5 tablets (2 mg total) by mouth every 8 (eight) hours as needed.  90 tablet  0  . zolpidem (AMBIEN) 5 MG tablet Take 1 tablet (5 mg total) by mouth at bedtime.  30 tablet  3   No current facility-administered medications on file prior to visit.    BP 136/86  Pulse 70  Wt 142 lb (64.411 kg)  BMI 19.81 kg/m2chart    Objective:   Physical Exam  Constitutional: He is oriented to person, place, and time. He appears well-developed and well-nourished.  HENT:  Right Ear: External ear normal.  Left Ear: External ear normal.  Nose: Nose normal.  Mouth/Throat: Oropharynx is clear and moist.  Neck: Normal range of motion. Neck supple.  Cardiovascular: Normal rate, regular rhythm and normal heart sounds.   Pulmonary/Chest: Effort normal and breath  sounds normal.  Abdominal: Soft. Bowel sounds are normal.  Musculoskeletal: Normal range of motion.  Neurological: He is alert and oriented to person, place, and time. He has normal reflexes.  Skin: Skin is warm and dry.  Psychiatric: He has a normal mood and affect.          Assessment & Plan:  Assessment: 1. Weakness 2. Status post CVA 3. Right hemiplegia 4. Depression  Plan: Labs and to include UA, BMP, CBC but the patient and the results. Increase Lexapro to 20 mg once daily to help his mood. I believe he is more depressed than anything else. Hopefully increase in his SSRI will help improve his mood and improve energy level. Recheck in 3-4 weeks and sooner as needed.

## 2013-05-31 NOTE — Telephone Encounter (Signed)
Pt would like a copy of labs done today mailed to his home.

## 2013-06-01 NOTE — Telephone Encounter (Signed)
Labs mailed

## 2013-06-02 ENCOUNTER — Ambulatory Visit: Payer: No Typology Code available for payment source | Admitting: Physical Therapy

## 2013-06-02 ENCOUNTER — Ambulatory Visit
Payer: No Typology Code available for payment source | Attending: Physical Medicine & Rehabilitation | Admitting: Occupational Therapy

## 2013-06-02 DIAGNOSIS — R279 Unspecified lack of coordination: Secondary | ICD-10-CM | POA: Insufficient documentation

## 2013-06-02 DIAGNOSIS — M2550 Pain in unspecified joint: Secondary | ICD-10-CM | POA: Insufficient documentation

## 2013-06-02 DIAGNOSIS — M2420 Disorder of ligament, unspecified site: Secondary | ICD-10-CM | POA: Insufficient documentation

## 2013-06-02 DIAGNOSIS — I69959 Hemiplegia and hemiparesis following unspecified cerebrovascular disease affecting unspecified side: Secondary | ICD-10-CM | POA: Insufficient documentation

## 2013-06-02 DIAGNOSIS — M629 Disorder of muscle, unspecified: Secondary | ICD-10-CM | POA: Insufficient documentation

## 2013-06-02 DIAGNOSIS — IMO0001 Reserved for inherently not codable concepts without codable children: Secondary | ICD-10-CM | POA: Insufficient documentation

## 2013-06-03 ENCOUNTER — Other Ambulatory Visit: Payer: Self-pay | Admitting: Family

## 2013-06-07 ENCOUNTER — Ambulatory Visit: Payer: PRIVATE HEALTH INSURANCE | Attending: Physical Medicine & Rehabilitation | Admitting: Physical Therapy

## 2013-06-07 ENCOUNTER — Ambulatory Visit: Payer: Medicare Other | Admitting: Family

## 2013-06-07 DIAGNOSIS — R279 Unspecified lack of coordination: Secondary | ICD-10-CM | POA: Insufficient documentation

## 2013-06-07 DIAGNOSIS — I69959 Hemiplegia and hemiparesis following unspecified cerebrovascular disease affecting unspecified side: Secondary | ICD-10-CM | POA: Insufficient documentation

## 2013-06-07 DIAGNOSIS — M2420 Disorder of ligament, unspecified site: Secondary | ICD-10-CM | POA: Insufficient documentation

## 2013-06-07 DIAGNOSIS — M2550 Pain in unspecified joint: Secondary | ICD-10-CM | POA: Insufficient documentation

## 2013-06-07 DIAGNOSIS — M629 Disorder of muscle, unspecified: Secondary | ICD-10-CM | POA: Insufficient documentation

## 2013-06-07 DIAGNOSIS — IMO0001 Reserved for inherently not codable concepts without codable children: Secondary | ICD-10-CM | POA: Insufficient documentation

## 2013-06-10 ENCOUNTER — Ambulatory Visit: Payer: PRIVATE HEALTH INSURANCE | Admitting: Physical Therapy

## 2013-06-10 ENCOUNTER — Ambulatory Visit: Payer: Medicare Other | Admitting: Physical Medicine & Rehabilitation

## 2013-06-10 ENCOUNTER — Ambulatory Visit: Payer: PRIVATE HEALTH INSURANCE | Admitting: Occupational Therapy

## 2013-06-11 NOTE — Progress Notes (Signed)
  Subjective:    Patient ID: Kevin Escobar, male    DOB: 1941/02/16, 72 y.o.   MRN: [id number]  HPI    Review of Systems     Objective:   Physical Exam        Assessment & Plan:  Assessment:  Insomnia-Ambien as needed for sleep.

## 2013-06-14 ENCOUNTER — Ambulatory Visit: Payer: Medicare Other | Admitting: Physical Therapy

## 2013-06-14 ENCOUNTER — Encounter: Payer: Medicare Other | Admitting: Occupational Therapy

## 2013-06-16 ENCOUNTER — Ambulatory Visit: Payer: Medicare Other | Admitting: Physical Medicine and Rehabilitation

## 2013-06-16 ENCOUNTER — Encounter: Payer: Medicare Other | Admitting: Physical Medicine and Rehabilitation

## 2013-06-17 ENCOUNTER — Ambulatory Visit: Payer: PRIVATE HEALTH INSURANCE | Admitting: Occupational Therapy

## 2013-06-17 ENCOUNTER — Ambulatory Visit: Payer: PRIVATE HEALTH INSURANCE | Admitting: Physical Therapy

## 2013-06-22 ENCOUNTER — Ambulatory Visit: Payer: PRIVATE HEALTH INSURANCE | Admitting: Occupational Therapy

## 2013-06-22 ENCOUNTER — Ambulatory Visit: Payer: PRIVATE HEALTH INSURANCE | Admitting: Physical Therapy

## 2013-06-23 ENCOUNTER — Telehealth: Payer: Self-pay | Admitting: Radiology

## 2013-06-23 NOTE — Telephone Encounter (Signed)
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  Imberly Troxler Carmell Austria, RN 06/23/2013 10:42 AM

## 2013-06-24 ENCOUNTER — Ambulatory Visit: Payer: PRIVATE HEALTH INSURANCE | Admitting: Occupational Therapy

## 2013-06-24 ENCOUNTER — Ambulatory Visit: Payer: PRIVATE HEALTH INSURANCE | Admitting: Physical Therapy

## 2013-07-06 ENCOUNTER — Ambulatory Visit: Payer: PRIVATE HEALTH INSURANCE | Attending: Physical Medicine & Rehabilitation | Admitting: Occupational Therapy

## 2013-07-06 ENCOUNTER — Ambulatory Visit: Payer: PRIVATE HEALTH INSURANCE | Admitting: Physical Therapy

## 2013-07-06 DIAGNOSIS — I69959 Hemiplegia and hemiparesis following unspecified cerebrovascular disease affecting unspecified side: Secondary | ICD-10-CM | POA: Insufficient documentation

## 2013-07-06 DIAGNOSIS — IMO0001 Reserved for inherently not codable concepts without codable children: Secondary | ICD-10-CM | POA: Insufficient documentation

## 2013-07-06 DIAGNOSIS — M2550 Pain in unspecified joint: Secondary | ICD-10-CM | POA: Insufficient documentation

## 2013-07-06 DIAGNOSIS — M2420 Disorder of ligament, unspecified site: Secondary | ICD-10-CM | POA: Insufficient documentation

## 2013-07-06 DIAGNOSIS — M629 Disorder of muscle, unspecified: Secondary | ICD-10-CM | POA: Insufficient documentation

## 2013-07-06 DIAGNOSIS — R279 Unspecified lack of coordination: Secondary | ICD-10-CM | POA: Insufficient documentation

## 2013-07-07 ENCOUNTER — Other Ambulatory Visit: Payer: Self-pay | Admitting: Family

## 2013-07-08 ENCOUNTER — Ambulatory Visit: Payer: PRIVATE HEALTH INSURANCE | Admitting: Physical Therapy

## 2013-07-08 ENCOUNTER — Ambulatory Visit: Payer: PRIVATE HEALTH INSURANCE | Admitting: Occupational Therapy

## 2013-07-12 ENCOUNTER — Ambulatory Visit: Payer: PRIVATE HEALTH INSURANCE | Admitting: Occupational Therapy

## 2013-07-12 ENCOUNTER — Ambulatory Visit: Payer: PRIVATE HEALTH INSURANCE | Admitting: Physical Therapy

## 2013-07-15 ENCOUNTER — Ambulatory Visit: Payer: PRIVATE HEALTH INSURANCE | Admitting: Occupational Therapy

## 2013-07-19 ENCOUNTER — Ambulatory Visit: Payer: PRIVATE HEALTH INSURANCE | Admitting: Physical Therapy

## 2013-07-19 ENCOUNTER — Ambulatory Visit: Payer: PRIVATE HEALTH INSURANCE | Admitting: Occupational Therapy

## 2013-07-22 ENCOUNTER — Ambulatory Visit: Payer: PRIVATE HEALTH INSURANCE | Admitting: Physical Therapy

## 2013-07-22 ENCOUNTER — Ambulatory Visit: Payer: PRIVATE HEALTH INSURANCE | Admitting: Occupational Therapy

## 2013-07-23 ENCOUNTER — Ambulatory Visit: Payer: Medicare Other | Admitting: Family

## 2013-07-24 ENCOUNTER — Other Ambulatory Visit: Payer: Self-pay | Admitting: Physical Medicine & Rehabilitation

## 2013-07-26 ENCOUNTER — Ambulatory Visit: Payer: PRIVATE HEALTH INSURANCE | Admitting: Physical Therapy

## 2013-07-26 ENCOUNTER — Ambulatory Visit: Payer: PRIVATE HEALTH INSURANCE | Admitting: Occupational Therapy

## 2013-07-27 ENCOUNTER — Encounter: Payer: Self-pay | Admitting: Emergency Medicine

## 2013-07-28 ENCOUNTER — Other Ambulatory Visit: Payer: Self-pay | Admitting: Physical Medicine & Rehabilitation

## 2013-08-02 ENCOUNTER — Ambulatory Visit: Payer: PRIVATE HEALTH INSURANCE | Admitting: Physical Therapy

## 2013-08-02 ENCOUNTER — Encounter: Payer: Medicare Other | Admitting: Occupational Therapy

## 2013-08-04 ENCOUNTER — Other Ambulatory Visit: Payer: Self-pay | Admitting: Family

## 2013-08-04 ENCOUNTER — Telehealth: Payer: Self-pay | Admitting: Emergency Medicine

## 2013-08-04 NOTE — Telephone Encounter (Signed)
Pt wife called ;  She received our letter in the mail to schedule a f/u appt.   She stated that Kevin Escobar is still recovering from stroke and going to rehab., he is still weak and learning to walk.  Also said that he will not be able to withstand having the [place] procedure to f/u on his kidney.    No symptoms related to our procedure, no pain, dysuria , or bowel problems.  He is following up with a Dr in [place] now. They did ck his creat on 05-31-13- creat- 2.8 (remaining stable at this level) He has not see nor does he have a future appt w/ Dr Briant Cedar.  Told wife I would make Dr Archer Asa aware and call her back with any suggestions.  Jari Sportsman, EMT 08/04/2013 1:54 PM   1611- Called wife back to make her aware of Dr Henri Medal thoughts- and we will f/u w/ the labs in [facility]

## 2013-08-04 NOTE — Telephone Encounter (Signed)
Message copied by Jari Sportsman on Wed Aug 04, 2013  4:10 PM ------      Message from: [facility], [place]      Created: Wed Aug 04, 2013  3:38 PM      Regarding: RE: Lorain Childes & suggestion       No need for him to see me right now, sounds like he has more important things going on right now.  Send our thoughts and prayers for a speedy recovery and have them call when he's feeling better.            HKM            ----- Message -----         From: Jari Sportsman, EMT         Sent: 08/04/2013   1:55 PM           To: Malachy Moan, MD      Subject: Lorain Childes & suggestion                                         Pt wife called ;  She received our letter in the mail to schedule a f/u appt.   She stated that Kevin Escobar is still recovering from stroke and going to rehab., he is still weak and learning to walk.  Also said that he will not be able to withstand having the [place] procedure to f/u on his kidney.              No symptoms related to our procedure, no pain, dysuria , or bowel problems.            He is following up with a Dr in [place] now. They did ck his creat on 05-31-13- creat- 2.8 (remaining stable at this level)      He has not see nor does he have a future appt w/ Dr Briant Cedar.            Told wife I would make Dr Archer Asa aware and call her back with any suggestions.            Jari Sportsman, EMT      08/04/2013 1:54 PM       ------

## 2013-08-11 ENCOUNTER — Ambulatory Visit
Payer: No Typology Code available for payment source | Attending: Physical Medicine & Rehabilitation | Admitting: Physical Therapy

## 2013-08-11 ENCOUNTER — Ambulatory Visit: Payer: No Typology Code available for payment source | Admitting: Occupational Therapy

## 2013-08-11 DIAGNOSIS — M2420 Disorder of ligament, unspecified site: Secondary | ICD-10-CM | POA: Insufficient documentation

## 2013-08-11 DIAGNOSIS — R279 Unspecified lack of coordination: Secondary | ICD-10-CM | POA: Insufficient documentation

## 2013-08-11 DIAGNOSIS — IMO0001 Reserved for inherently not codable concepts without codable children: Secondary | ICD-10-CM | POA: Insufficient documentation

## 2013-08-11 DIAGNOSIS — M629 Disorder of muscle, unspecified: Secondary | ICD-10-CM | POA: Insufficient documentation

## 2013-08-11 DIAGNOSIS — I69959 Hemiplegia and hemiparesis following unspecified cerebrovascular disease affecting unspecified side: Secondary | ICD-10-CM | POA: Insufficient documentation

## 2013-08-11 DIAGNOSIS — M2550 Pain in unspecified joint: Secondary | ICD-10-CM | POA: Insufficient documentation

## 2013-08-12 ENCOUNTER — Ambulatory Visit: Payer: No Typology Code available for payment source | Admitting: Physical Therapy

## 2013-08-16 ENCOUNTER — Ambulatory Visit: Payer: No Typology Code available for payment source | Admitting: Physical Therapy

## 2013-08-16 ENCOUNTER — Ambulatory Visit: Payer: No Typology Code available for payment source | Admitting: Occupational Therapy

## 2013-08-18 ENCOUNTER — Ambulatory Visit: Payer: No Typology Code available for payment source | Admitting: Physical Therapy

## 2013-08-18 ENCOUNTER — Ambulatory Visit: Payer: No Typology Code available for payment source | Admitting: Occupational Therapy

## 2013-08-19 ENCOUNTER — Ambulatory Visit: Payer: PRIVATE HEALTH INSURANCE | Admitting: Podiatry

## 2013-08-19 ENCOUNTER — Encounter: Payer: Self-pay | Admitting: Podiatry

## 2013-08-19 ENCOUNTER — Ambulatory Visit (INDEPENDENT_AMBULATORY_CARE_PROVIDER_SITE_OTHER): Payer: Medicare Other | Admitting: Podiatry

## 2013-08-19 VITALS — BP 152/80 | HR 76 | Resp 16

## 2013-08-19 DIAGNOSIS — M79609 Pain in unspecified limb: Secondary | ICD-10-CM

## 2013-08-19 DIAGNOSIS — B351 Tinea unguium: Secondary | ICD-10-CM

## 2013-08-19 NOTE — Progress Notes (Signed)
Patient ID: Kevin Escobar, male   DOB: 07/16/1941, 73 y.o.   MRN: [id number]  Subjective: 73 year old white male request debridement of painful mycotic toenails.  Objective: Patient transfers from wheelchair to treatment chair. Elongated, hypertrophic, brittle toenails x10 noted.  Assessment: Symptomatic onychomycoses x10  Plan: Nails x10 are debrided back without any bleeding. Reappoint at three-month intervals

## 2013-08-24 ENCOUNTER — Ambulatory Visit: Payer: No Typology Code available for payment source | Admitting: Occupational Therapy

## 2013-08-24 ENCOUNTER — Ambulatory Visit: Payer: No Typology Code available for payment source | Admitting: Physical Therapy

## 2013-08-24 DIAGNOSIS — F4321 Adjustment disorder with depressed mood: Secondary | ICD-10-CM

## 2013-08-24 DIAGNOSIS — I669 Occlusion and stenosis of unspecified cerebral artery: Secondary | ICD-10-CM

## 2013-08-26 ENCOUNTER — Ambulatory Visit: Payer: No Typology Code available for payment source | Admitting: Occupational Therapy

## 2013-08-26 ENCOUNTER — Ambulatory Visit: Payer: No Typology Code available for payment source | Admitting: Physical Therapy

## 2013-08-31 ENCOUNTER — Ambulatory Visit: Payer: No Typology Code available for payment source | Admitting: Physical Therapy

## 2013-08-31 ENCOUNTER — Ambulatory Visit: Payer: No Typology Code available for payment source | Admitting: Occupational Therapy

## 2013-09-02 ENCOUNTER — Ambulatory Visit: Payer: No Typology Code available for payment source | Admitting: Occupational Therapy

## 2013-09-02 ENCOUNTER — Ambulatory Visit: Payer: No Typology Code available for payment source | Admitting: Physical Therapy

## 2013-09-07 DIAGNOSIS — F0630 Mood disorder due to known physiological condition, unspecified: Secondary | ICD-10-CM

## 2013-09-07 DIAGNOSIS — I6789 Other cerebrovascular disease: Secondary | ICD-10-CM

## 2013-09-08 ENCOUNTER — Ambulatory Visit: Payer: 59 | Attending: Physical Medicine & Rehabilitation | Admitting: Physical Therapy

## 2013-09-08 DIAGNOSIS — R279 Unspecified lack of coordination: Secondary | ICD-10-CM | POA: Insufficient documentation

## 2013-09-08 DIAGNOSIS — M629 Disorder of muscle, unspecified: Secondary | ICD-10-CM | POA: Insufficient documentation

## 2013-09-08 DIAGNOSIS — M2420 Disorder of ligament, unspecified site: Secondary | ICD-10-CM | POA: Insufficient documentation

## 2013-09-08 DIAGNOSIS — IMO0001 Reserved for inherently not codable concepts without codable children: Secondary | ICD-10-CM | POA: Insufficient documentation

## 2013-09-08 DIAGNOSIS — I69959 Hemiplegia and hemiparesis following unspecified cerebrovascular disease affecting unspecified side: Secondary | ICD-10-CM | POA: Insufficient documentation

## 2013-09-08 DIAGNOSIS — M2550 Pain in unspecified joint: Secondary | ICD-10-CM | POA: Insufficient documentation

## 2013-09-09 ENCOUNTER — Telehealth: Payer: Self-pay | Admitting: Family

## 2013-09-09 NOTE — Telephone Encounter (Signed)
Pt wife is calling to advise to disregard previous request, due to dr.zelson advise pt that he has to go through the rehab doctor.

## 2013-09-09 NOTE — Telephone Encounter (Signed)
Pt's wife called to inform you that Dr Valentina Shaggy advised that pt have his anti depressant increased or changed to a stronger med. Advised wife I would pass info you.

## 2013-09-13 ENCOUNTER — Ambulatory Visit: Payer: 59 | Admitting: Physical Therapy

## 2013-09-13 ENCOUNTER — Ambulatory Visit: Payer: 59 | Admitting: Occupational Therapy

## 2013-09-20 ENCOUNTER — Ambulatory Visit: Payer: 59 | Admitting: Physical Therapy

## 2013-09-20 ENCOUNTER — Ambulatory Visit: Payer: 59 | Admitting: Occupational Therapy

## 2013-09-23 ENCOUNTER — Encounter: Payer: Self-pay | Admitting: Family Medicine

## 2013-09-23 ENCOUNTER — Ambulatory Visit (INDEPENDENT_AMBULATORY_CARE_PROVIDER_SITE_OTHER): Payer: Medicare HMO | Admitting: Family Medicine

## 2013-09-23 VITALS — BP 162/82 | HR 72 | Temp 96.7°F | Wt 148.2 lb

## 2013-09-23 DIAGNOSIS — F329 Major depressive disorder, single episode, unspecified: Secondary | ICD-10-CM

## 2013-09-23 DIAGNOSIS — I10 Essential (primary) hypertension: Secondary | ICD-10-CM

## 2013-09-23 DIAGNOSIS — N189 Chronic kidney disease, unspecified: Secondary | ICD-10-CM

## 2013-09-23 DIAGNOSIS — I639 Cerebral infarction, unspecified: Secondary | ICD-10-CM

## 2013-09-23 DIAGNOSIS — F32A Depression, unspecified: Secondary | ICD-10-CM

## 2013-09-23 DIAGNOSIS — F3289 Other specified depressive episodes: Secondary | ICD-10-CM

## 2013-09-23 DIAGNOSIS — I6350 Cerebral infarction due to unspecified occlusion or stenosis of unspecified cerebral artery: Secondary | ICD-10-CM

## 2013-09-23 DIAGNOSIS — L299 Pruritus, unspecified: Secondary | ICD-10-CM

## 2013-09-23 MED ORDER — CLOPIDOGREL BISULFATE 75 MG PO TABS: 75.0000 mg | ORAL_TABLET | Freq: Every day | ORAL | Status: DC

## 2013-09-23 MED ORDER — ATORVASTATIN CALCIUM 20 MG PO TABS: 20.0000 mg | ORAL_TABLET | Freq: Every day | ORAL | Status: DC

## 2013-09-23 MED ORDER — SERTRALINE HCL 100 MG PO TABS: 100.0000 mg | ORAL_TABLET | Freq: Every day | ORAL | Status: DC

## 2013-09-23 MED ORDER — ATORVASTATIN CALCIUM 20 MG PO TABS
20.0000 mg | ORAL_TABLET | Freq: Every day | ORAL | Status: DC
Start: 2013-09-23 — End: 2013-12-07

## 2013-09-23 NOTE — Progress Notes (Signed)
Pre visit review using our clinic review tool, if applicable. No additional management support is needed unless otherwise documented below in the visit note. 

## 2013-09-23 NOTE — Assessment & Plan Note (Signed)
Has been on lexapro 20mg  for last 4 months, has not noticed improvement since he's been on this med. Recommend transition from lexapro to zoloft - start at 100mg  daily. Reassess next visit.  Pt/wife agree with plan.

## 2013-09-23 NOTE — Patient Instructions (Signed)
Restart plavix and lipitor. Let's stop lexapro and start sertraline 100mg  daily for mood. Return to see me in 2-3 months for wellness exam, prior fasting for blood work. Return sooner if needed. Good to meet you today, call us with questions. Check on date of pneumonia shot.

## 2013-09-23 NOTE — Assessment & Plan Note (Signed)
Advised restart lipitor and plavix. Pt will continue to f/u with PM&R.

## 2013-09-23 NOTE — Assessment & Plan Note (Signed)
Lab Results  Component Value Date   CREATININE 2.8* 05/31/2013  recheck next blood work.

## 2013-09-23 NOTE — Progress Notes (Signed)
BP 162/82  Pulse 72  Temp(Src) 96.7 F (35.9 C) (Tympanic)  Wt 148 lb 4 oz (67.246 kg)   CC: transfer of care from [facility]  Subjective:    Patient ID: Kevin Escobar, male    DOB: 03/22/1941, 73 y.o.   MRN: [id number]  HPI: Kevin Escobar is a 73 y.o. male presenting on 09/23/2013 with Establish Care  Pleasant 73 yo presents with wife today as transfer of care from [employer].  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  He also has history of HTN, PVD, emphysema current nonsmoker, CKD and depression.  He also has history of bladder and prostate cancer, and AAA repaired 1990s.  He sees rehab Dr. Letta Pate.  Saw (Dr. Rosana Hoes at [facility]) urologist - bladder cancer may be returning.  He actually prior saw Dr Council Mechanic when our clinic opened 12 years ago.  Notes increasing sleeping during the day and decreased effort with home PT.  Worried that depression has worsened. Pt endorses depressed mood.  Increased impatience and frustration with lack of progress.  Appetite good, energy level down, concentration level down, endorses anhedonia.  More irritable with wife.  Lexapro recently increased from 10mg  to 20mg  (05/2013).   Since stroke having frequent itching spells on shoulder and under arm and behind R knee.  No rash, just itching.  Has not tried benadryl consistently.  Relevant past medical, surgical, family and social history reviewed and updated. Allergies and medications reviewed and updated. Current Outpatient Prescriptions on File Prior to Visit  Medication Sig  . amLODipine (NORVASC) 10 MG tablet TAKE 1 TABLET EVERY DAY AT NOON, AND A 2ND TABLET ON RANDOM DAYS 2-3 TIMES A WEEK IN THE EVENING  . cloNIDine (CATAPRES) 0.1 MG tablet Take 1 tablet (0.1 mg total) by mouth 3 (three) times daily.  . metoprolol tartrate (LOPRESSOR) 25 MG tablet TAKE 1/2 TABLET BY MOUTH TWICE DAILY  . tiZANidine (ZANAFLEX) 4 MG tablet Take 0.5 tablets (2 mg total) by mouth every 8 (eight) hours as  needed.  . flavoxATE (URISPAS) 100 MG tablet TAKE 1 TABLET BY MOUTH THREE TIMES DAILY  . zolpidem (AMBIEN) 5 MG tablet Take 1 tablet (5 mg total) by mouth at bedtime.   No current facility-administered medications on file prior to visit.    Review of Systems Per HPI unless specifically indicated above    Objective:    BP 162/82  Pulse 72  Temp(Src) 96.7 F (35.9 C) (Tympanic)  Wt 148 lb 4 oz (67.246 kg)  Physical Exam  Nursing note and vitals reviewed. Constitutional: He is oriented to person, place, and time. He appears well-developed and well-nourished. No distress.  HENT:  Head: Normocephalic and atraumatic.  Right Ear: Hearing, tympanic membrane, external ear and ear canal normal.  Left Ear: Hearing, tympanic membrane, external ear and ear canal normal.  Nose: Nose normal.  Mouth/Throat: Uvula is midline, oropharynx is clear and moist and mucous membranes are normal. No oropharyngeal exudate, posterior oropharyngeal edema or posterior oropharyngeal erythema.  Eyes: Conjunctivae and EOM are normal. Pupils are equal, round, and reactive to light. No scleral icterus.  Anisocoria - L pupil enlarged compared to R  Neck: Normal range of motion. Neck supple.  Cardiovascular: Normal rate, regular rhythm, normal heart sounds and intact distal pulses.   No murmur heard. Pulses:      Radial pulses are 2+ on the right side, and 2+ on the left side.  Pulmonary/Chest: Effort normal and breath sounds normal. No  respiratory distress. He has no wheezes. He has no rales.  Musculoskeletal: Normal range of motion. He exhibits no edema.  Neurological: He is alert and oriented to person, place, and time.  Spastic R hemiparesis. dysarthria  Skin: Skin is warm and dry. No rash noted.  Psychiatric: He has a normal mood and affect. His behavior is normal. Judgment and thought content normal.       Assessment & Plan:   Problem List Items Addressed This Visit   Chronic kidney disease      Lab  Results  Component Value Date   CREATININE 2.8* 05/31/2013  recheck next blood work.    CVA (cerebral vascular accident)     Advised restart lipitor and plavix. Pt will continue to f/u with PM&R.    Relevant Medications      atorvastatin (LIPITOR) tablet   Depression     Has been on lexapro 20mg  for last 4 months, has not noticed improvement since he's been on this med. Recommend transition from lexapro to zoloft - start at 100mg  daily. Reassess next visit.  Pt/wife agree with plan.    Relevant Medications      sertraline (ZOLOFT) tablet   Hypertension - Primary      bp staying elevated - reassess next visit.  If persistently elevated,consider starting ACEI.  BP Readings from Last 3 Encounters:  09/23/13 162/82  08/19/13 152/80  05/31/13 136/86      Pruritic condition     Without rash - ?lichen simplex chronicus. Advised trial of benadryl. Reassess next visit.        Follow up plan: Return in about 3 months (around 12/21/2013), or if symptoms worsen or fail to improve, for annual exam, prior fasting for blood work.

## 2013-09-23 NOTE — Assessment & Plan Note (Addendum)
bp staying elevated - reassess next visit.  If persistently elevated,consider starting ACEI.  BP Readings from Last 3 Encounters:  09/23/13 162/82  08/19/13 152/80  05/31/13 136/86

## 2013-09-23 NOTE — Assessment & Plan Note (Signed)
Without rash - ?lichen simplex chronicus. Advised trial of benadryl. Reassess next visit.

## 2013-09-24 ENCOUNTER — Telehealth: Payer: Self-pay | Admitting: Family Medicine

## 2013-09-24 NOTE — Telephone Encounter (Signed)
Relevant patient education assigned to patient using Emmi. ° °

## 2013-09-27 ENCOUNTER — Ambulatory Visit: Payer: 59 | Admitting: Occupational Therapy

## 2013-09-27 ENCOUNTER — Ambulatory Visit: Payer: 59 | Admitting: Physical Therapy

## 2013-09-29 ENCOUNTER — Ambulatory Visit: Payer: 59 | Admitting: Physical Therapy

## 2013-09-29 ENCOUNTER — Ambulatory Visit: Payer: 59 | Admitting: Occupational Therapy

## 2013-10-12 ENCOUNTER — Encounter: Payer: Self-pay | Admitting: Physical Medicine & Rehabilitation

## 2013-10-12 ENCOUNTER — Ambulatory Visit (HOSPITAL_BASED_OUTPATIENT_CLINIC_OR_DEPARTMENT_OTHER): Payer: Medicare HMO | Admitting: Physical Medicine & Rehabilitation

## 2013-10-12 ENCOUNTER — Encounter: Payer: Medicare Other | Attending: Physical Medicine & Rehabilitation

## 2013-10-12 VITALS — BP 145/78 | HR 63 | Resp 14 | Ht 71.0 in | Wt 150.0 lb

## 2013-10-12 DIAGNOSIS — I69959 Hemiplegia and hemiparesis following unspecified cerebrovascular disease affecting unspecified side: Secondary | ICD-10-CM | POA: Insufficient documentation

## 2013-10-12 DIAGNOSIS — G8110 Spastic hemiplegia affecting unspecified side: Secondary | ICD-10-CM

## 2013-10-12 NOTE — Progress Notes (Signed)
Subjective:    Patient ID: Kevin Escobar, male    DOB: [birth year], 73 y.o.   MRN: [id number] Kevin Escobar is a 73 y.o. male with a past medical history significant for HTN, TIA, CKD, lupus, renal artery stenosis s/p stent placement, AAA repair in 1995, prostate and bladder cancer; admitted on 02/21/13 with progressive difficulty walking due to right sided weakness and fall as well and 2 episodes of transient language impairment for >24 hours PTA. MRI/MRA brain with acute left posterior limb infarct and no IC stenosis. 2D echo with EF 60-65% and No wall abnormality. Carotid dopplers with mild soft plaque L-ICA origin. Patient with neurological worsening on 07/21 with vomiting/?aspiration event. He was placed on bedrest and follow up CCT stable. 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 He continues on D1 nectar liquids due to aspiration of thins  CIR stay  Admit date: 02/26/2013  Discharge date: 03/27/2013   HPI Completed outpt PT/OT 2 weeks ago Increased spasticity Needs help dressing, bathing wife assist ~50% Pain Inventory Average Pain 3 Pain Right Now 1 My pain is other  In the last 24 hours, has pain interfered with the following? General activity 0 Relation with others 5 Enjoyment of life 4 What TIME of day is your pain at its worst? daytime Sleep (in general) Fair  Pain is worse with: unsure Pain improves with: rest Relief from Meds: 0  Mobility walk with assistance how many minutes can you walk? 5 ability to climb steps?  no do you drive?  no use a wheelchair needs help with transfers Do you have any goals in this area?  yes  Function retired I need assistance with the following:  dressing, bathing, toileting and meal prep Do you have any goals in this area?  yes  Neuro/Psych weakness trouble walking depression  Prior Studies Any changes since last visit?  no  Physicians involved in your  care Any changes since last visit?  yes Primary care Dr. Berline Chough   Family History  Problem Relation Age of Onset  . Cancer Father     prostate  . Heart failure Mother   . Cancer Brother     prostate  . Diabetes Sister   . CAD Neg Hx   . Stroke Neg Hx    History   Social History  . Marital Status: Married    Spouse Name: N/A    Number of Children: N/A  . Years of Education: N/A   Social History Main Topics  . Smoking status: Former Smoker -- 1.00 packs/day for 60 years    Quit date: 02/20/2013  . Smokeless tobacco: Never Used  . Alcohol Use: No  . Drug Use: No  . Sexual Activity: None   Other Topics Concern  . None   Social History Narrative  . None   Past Surgical History  Procedure Laterality Date  . Abdominal aortic aneurysm repair  1995  . Prostate surgery  1994  . Colon surgery  2010    fistula repair  . Hernia repair  2005   Past Medical History  Diagnosis Date  . Cancer of bladder 2010    Dr. Lawerance Bach  . Cancer of prostate 1994    Dr. Lawerance Bach  . Depression   . Chronic kidney disease     [place]  . Hypertension   . TIA (transient ischemic attack) 2008  . PVD (peripheral vascular disease) 02/21/2013  . CVA (  cerebral infarction) 02/2013    residual R hemiparesis  . AAA (abdominal aortic aneurysm) 1995    s/p repair   BP 145/78  Pulse 63  Resp 14  Ht 5\' 11"  (1.803 m)  Wt 150 lb (68.04 kg)  BMI 20.93 kg/m2  SpO2 95%  Opioid Risk Score:   Fall Risk Score: High Fall Risk (>13 points) (pt educated on fall risk, pt given brochure)    Review of Systems  Musculoskeletal: Positive for gait problem.  Skin: Positive for rash.  Neurological: Positive for weakness.  Psychiatric/Behavioral: Positive for dysphoric mood.  All other systems reviewed and are negative.       Objective:   Physical Exam  3 minus right biceps, 3 minus right wrist flexors and wrist extensors 3 minus supinator right 2 minus finger flexion and extension  on the right  Ashworth grade 3 spasticity at the wrist flexors and finger flexors as well as the pronators Ashworth grade 3 in the biceps       Assessment & Plan:  #1. 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 Has gone through inpatient rehabilitation as well as outpatient rehabilitation with persisting problems. Has also tried Zanaflex with minimal relief. Discussed treatment options including Botox injection to improve finger extension. We did discuss that it does not increase strength but can help re balance muscles

## 2013-10-12 NOTE — Patient Instructions (Signed)
OnabotulinumtoxinA injection (Medical Use) What is this medicine? ONABOTULINUMTOXINA (o na BOTT you lye num tox in eh) is a neuro-muscular blocker. This medicine is used to treat crossed eyes, eyelid spasms, severe neck muscle spasms, and elbow, wrist, and finger muscle spasms. It is also used to treat excessive underarm sweating, to prevent chronic migraine headaches, and to treat loss of bladder control due to neurologic conditions such as multiple sclerosis or spinal cord injury. This medicine may be used for other purposes; ask your health care provider or pharmacist if you have questions. COMMON BRAND NAME(S): Botox What should I tell my health care provider before I take this medicine? They need to know if you have any of these conditions: -breathing problems -cerebral palsy spasms -difficulty urinating -heart problems -history of surgery where this medicine is going to be used -infection at the site where this medicine is going to be used -myasthenia gravis or other neurologic disease -nerve or muscle disease -surgery plans -take medicines that treat or prevent blood clots -thyroid problems -an unusual or allergic reaction to botulinum toxin, albumin, other medicines, foods, dyes, or preservatives -pregnant or trying to get pregnant -breast-feeding How should I use this medicine? This medicine is for injection into a muscle. It is given by a health care professional in a hospital or clinic setting. Talk to your pediatrician regarding the use of this medicine in children. While this drug may be prescribed for children as young as 55 years old for selected conditions, precautions do apply. Overdosage: If you think you have taken too much of this medicine contact a poison control center or emergency room at once. NOTE: This medicine is only for you. Do not share this medicine with others. What if I miss a dose? This does not apply. What may interact with this  medicine? -aminoglycoside antibiotics like gentamicin, neomycin, tobramycin -muscle relaxants -other botulinum toxin injections This list may not describe all possible interactions. Give your health care provider a list of all the medicines, herbs, non-prescription drugs, or dietary supplements you use. Also tell them if you smoke, drink alcohol, or use illegal drugs. Some items may interact with your medicine. What should I watch for while using this medicine? Visit your doctor for regular check ups. This medicine will cause weakness in the muscle where it is injected. Tell your doctor if you feel unusually weak in other muscles. Get medical help right away if you have problems with breathing, swallowing, or talking. This medicine might make your eyelids droop or make you see blurry or double. If you have weak muscles or trouble seeing do not drive a car, use machinery, or do other dangerous activities. This medicine contains albumin from human blood. It may be possible to pass an infection in this medicine, but no cases have been reported. Talk to your doctor about the risks and benefits of this medicine. If your activities have been limited by your condition, go back to your regular routine slowly after treatment with this medicine. What side effects may I notice from receiving this medicine? Side effects that you should report to your doctor or health care professional as soon as possible: -allergic reactions like skin rash, itching or hives, swelling of the face, lips, or tongue -breathing problems -changes in vision -chest pain or tightness -eye irritation, pain -fast, irregular heartbeat -infection -numbness -speech problems -swallowing problems -unusual weakness Side effects that usually do not require medical attention (report to your doctor or health care professional if they continue or  are bothersome): -bruising or pain at site where injected -drooping eyelid -dry eyes or  mouth -headache -muscles aches, pains -sensitivity to light -tearing This list may not describe all possible side effects. Call your doctor for medical advice about side effects. You may report side effects to FDA at 1-800-FDA-1088. Where should I keep my medicine? This drug is given in a hospital or clinic and will not be stored at home. NOTE: This sheet is a summary. It may not cover all possible information. If you have questions about this medicine, talk to your doctor, pharmacist, or health care provider.  2014, Elsevier/Gold Standard. (2012-04-20 17:30:24)

## 2013-10-25 ENCOUNTER — Encounter: Payer: Self-pay | Admitting: Family Medicine

## 2013-11-24 ENCOUNTER — Other Ambulatory Visit: Payer: 59

## 2013-11-25 ENCOUNTER — Encounter: Payer: Self-pay | Admitting: Physical Medicine & Rehabilitation

## 2013-11-25 ENCOUNTER — Encounter: Payer: No Typology Code available for payment source | Attending: Physical Medicine & Rehabilitation

## 2013-11-25 ENCOUNTER — Ambulatory Visit (HOSPITAL_BASED_OUTPATIENT_CLINIC_OR_DEPARTMENT_OTHER): Payer: Medicare Other | Admitting: Physical Medicine & Rehabilitation

## 2013-11-25 VITALS — BP 139/59 | HR 91 | Resp 14 | Ht 71.0 in | Wt 150.0 lb

## 2013-11-25 DIAGNOSIS — G8110 Spastic hemiplegia affecting unspecified side: Secondary | ICD-10-CM

## 2013-11-25 DIAGNOSIS — I69959 Hemiplegia and hemiparesis following unspecified cerebrovascular disease affecting unspecified side: Secondary | ICD-10-CM | POA: Insufficient documentation

## 2013-11-25 NOTE — Progress Notes (Signed)
Botox Injection for spasticity using needle EMG guidance  Dilution: 50 Units/ml Indication: Severe spasticity which interferes with ADL,mobility and/or  hygiene and is unresponsive to medication management and other conservative care Informed consent was obtained after describing risks and benefits of the procedure with the patient. This includes bleeding, bruising, infection, excessive weakness, or medication side effects. A REMS form is on file and signed. Needle: 26g 2" needle electrode Number of units per muscle  FCR50 FCU0 FDS50 FDP50 PT50  All injections were done after obtaining appropriate EMG activity and after negative drawback for blood. The patient tolerated the procedure well. Post procedure instructions were given. A followup appointment was made.

## 2013-12-07 ENCOUNTER — Emergency Department (HOSPITAL_COMMUNITY): Payer: 59

## 2013-12-07 ENCOUNTER — Other Ambulatory Visit: Payer: 59

## 2013-12-07 ENCOUNTER — Encounter (HOSPITAL_COMMUNITY): Payer: Self-pay | Admitting: Emergency Medicine

## 2013-12-07 ENCOUNTER — Inpatient Hospital Stay: Admission: RE | Admit: 2013-12-07 | Payer: 59 | Source: Ambulatory Visit

## 2013-12-07 ENCOUNTER — Emergency Department (HOSPITAL_COMMUNITY)
Admission: EM | Admit: 2013-12-07 | Discharge: 2013-12-07 | Disposition: A | Discharge status: Home / Self Care | Payer: 59 | Attending: Emergency Medicine | Admitting: Emergency Medicine

## 2013-12-07 DIAGNOSIS — Z7902 Long term (current) use of antithrombotics/antiplatelets: Secondary | ICD-10-CM | POA: Insufficient documentation

## 2013-12-07 DIAGNOSIS — M545 Low back pain, unspecified: Secondary | ICD-10-CM | POA: Insufficient documentation

## 2013-12-07 DIAGNOSIS — I129 Hypertensive chronic kidney disease with stage 1 through stage 4 chronic kidney disease, or unspecified chronic kidney disease: Secondary | ICD-10-CM | POA: Insufficient documentation

## 2013-12-07 DIAGNOSIS — N179 Acute kidney failure, unspecified: Secondary | ICD-10-CM | POA: Insufficient documentation

## 2013-12-07 DIAGNOSIS — F3289 Other specified depressive episodes: Secondary | ICD-10-CM | POA: Insufficient documentation

## 2013-12-07 DIAGNOSIS — F329 Major depressive disorder, single episode, unspecified: Secondary | ICD-10-CM | POA: Insufficient documentation

## 2013-12-07 DIAGNOSIS — Z8673 Personal history of transient ischemic attack (TIA), and cerebral infarction without residual deficits: Secondary | ICD-10-CM | POA: Insufficient documentation

## 2013-12-07 DIAGNOSIS — I69969 Other paralytic syndrome following unspecified cerebrovascular disease affecting unspecified side: Secondary | ICD-10-CM | POA: Insufficient documentation

## 2013-12-07 DIAGNOSIS — N184 Chronic kidney disease, stage 4 (severe): Secondary | ICD-10-CM | POA: Insufficient documentation

## 2013-12-07 DIAGNOSIS — Z87891 Personal history of nicotine dependence: Secondary | ICD-10-CM | POA: Insufficient documentation

## 2013-12-07 DIAGNOSIS — Z8551 Personal history of malignant neoplasm of bladder: Secondary | ICD-10-CM | POA: Insufficient documentation

## 2013-12-07 DIAGNOSIS — Z79899 Other long term (current) drug therapy: Secondary | ICD-10-CM | POA: Insufficient documentation

## 2013-12-07 DIAGNOSIS — M549 Dorsalgia, unspecified: Secondary | ICD-10-CM

## 2013-12-07 DIAGNOSIS — N189 Chronic kidney disease, unspecified: Secondary | ICD-10-CM

## 2013-12-07 DIAGNOSIS — Z8546 Personal history of malignant neoplasm of prostate: Secondary | ICD-10-CM | POA: Insufficient documentation

## 2013-12-07 LAB — COMPREHENSIVE METABOLIC PANEL
ALK PHOS: 80 U/L (ref 39–117)
ALT: 10 U/L (ref 0–53)
AST: 11 U/L (ref 0–37)
Albumin: 3.1 g/dL — ABNORMAL LOW (ref 3.5–5.2)
BILIRUBIN TOTAL: 0.4 mg/dL (ref 0.3–1.2)
BUN: 46 mg/dL — AB (ref 6–23)
CHLORIDE: 102 meq/L (ref 96–112)
CO2: 19 meq/L (ref 19–32)
CREATININE: 3.57 mg/dL — AB (ref 0.50–1.35)
Calcium: 9.5 mg/dL (ref 8.4–10.5)
GFR, EST AFRICAN AMERICAN: 18 mL/min — AB (ref >90)
GFR, EST NON AFRICAN AMERICAN: 15 mL/min — AB (ref >90)
GLUCOSE: 95 mg/dL (ref 70–99)
POTASSIUM: 3.9 meq/L (ref 3.7–5.3)
Sodium: 139 mEq/L (ref 137–147)
Total Protein: 7.1 g/dL (ref 6.0–8.3)

## 2013-12-07 LAB — I-STAT CHEM 8, ED
BUN: 43 mg/dL — AB (ref 6–23)
CREATININE: 3.8 mg/dL — AB (ref 0.50–1.35)
Calcium, Ion: 1.22 mmol/L (ref 1.13–1.30)
Chloride: 108 mEq/L (ref 96–112)
GLUCOSE: 94 mg/dL (ref 70–99)
HCT: 40 % (ref 39.0–52.0)
Hemoglobin: 13.6 g/dL (ref 13.0–17.0)
POTASSIUM: 3.8 meq/L (ref 3.7–5.3)
SODIUM: 141 meq/L (ref 137–147)
TCO2: 20 mmol/L (ref 0–100)

## 2013-12-07 LAB — CBC
HEMATOCRIT: 37.6 % — AB (ref 39.0–52.0)
HEMOGLOBIN: 12.9 g/dL — AB (ref 13.0–17.0)
MCH: 29.1 pg (ref 26.0–34.0)
MCHC: 34.3 g/dL (ref 30.0–36.0)
MCV: 84.9 fL (ref 78.0–100.0)
Platelets: 221 10*3/uL (ref 150–400)
RBC: 4.43 MIL/uL (ref 4.22–5.81)
RDW: 13.4 % (ref 11.5–15.5)
WBC: 9.5 10*3/uL (ref 4.0–10.5)

## 2013-12-07 LAB — URINALYSIS, ROUTINE W REFLEX MICROSCOPIC
Bilirubin Urine: NEGATIVE
Glucose, UA: NEGATIVE mg/dL
KETONES UR: NEGATIVE mg/dL
Leukocytes, UA: NEGATIVE
Nitrite: NEGATIVE
Protein, ur: >300 mg/dL — AB
Specific Gravity, Urine: 1.014 (ref 1.005–1.030)
Urobilinogen, UA: 0.2 mg/dL (ref 0.0–1.0)
pH: 6.5 (ref 5.0–8.0)

## 2013-12-07 LAB — URINE MICROSCOPIC-ADD ON

## 2013-12-07 LAB — CK: Total CK: 133 U/L (ref 7–232)

## 2013-12-07 MED ORDER — CLONIDINE HCL 0.1 MG PO TABS
0.1000 mg | ORAL_TABLET | Freq: Once | ORAL | Status: AC
  Administered 2013-12-07: 0.1 mg via ORAL
  Filled 2013-12-07: qty 1

## 2013-12-07 MED ORDER — SODIUM CHLORIDE 0.9 % IV SOLN
INTRAVENOUS | Status: DC
  Administered 2013-12-07: 100 mL/h via INTRAVENOUS

## 2013-12-07 MED ORDER — SODIUM CHLORIDE 0.9 % IV BOLUS (SEPSIS)
500.0000 mL | Freq: Once | INTRAVENOUS | Status: AC
  Administered 2013-12-07: 500 mL via INTRAVENOUS

## 2013-12-07 MED ORDER — OXYCODONE-ACETAMINOPHEN 5-325 MG PO TABS: 1.0000 | ORAL_TABLET | Freq: Four times a day (QID) | ORAL | Status: DC | PRN

## 2013-12-07 MED ORDER — FENTANYL CITRATE 0.05 MG/ML IJ SOLN
50.0000 ug | INTRAMUSCULAR | Status: DC | PRN
  Administered 2013-12-07: 50 ug via INTRAVENOUS
  Filled 2013-12-07: qty 2

## 2013-12-07 MED ORDER — ZOLPIDEM TARTRATE 5 MG PO TABS: 5.0000 mg | ORAL_TABLET | Freq: Every evening | ORAL | Status: DC | PRN

## 2013-12-07 MED ORDER — TUBERCULIN PPD 5 UNIT/0.1ML ID SOLN
5.0000 [IU] | Freq: Once | INTRADERMAL | Status: DC
  Administered 2013-12-07: 5 [IU] via INTRADERMAL
  Filled 2013-12-07: qty 0.1

## 2013-12-07 MED ORDER — OXYCODONE-ACETAMINOPHEN 5-325 MG PO TABS
1.0000 | ORAL_TABLET | Freq: Once | ORAL | Status: AC
  Administered 2013-12-07: 1 via ORAL
  Filled 2013-12-07: qty 1

## 2013-12-07 MED ORDER — ONDANSETRON HCL 4 MG/2ML IJ SOLN
4.0000 mg | Freq: Once | INTRAMUSCULAR | Status: DC
  Filled 2013-12-07: qty 2

## 2013-12-07 NOTE — ED Notes (Signed)
Dr. Dwyane Dee patient's wife giving patient his blood pressure medication this AM at 0735.

## 2013-12-07 NOTE — ED Notes (Signed)
MD at bedside. 

## 2013-12-07 NOTE — ED Notes (Signed)
Pt tried to urinate but didn't have any success, pt states he urinated before he left the facility.Pt states he will try again in a little bit.

## 2013-12-07 NOTE — ED Provider Notes (Addendum)
7:20 AM  Assumed care from Dr. Marnette Burgess.  Pt is a 73 y.o. M with history of CVA with right-sided deficits who is wheelchair-bound, history of prostate and bladder cancer, AAA repaired in the 1990s who presents emergency room with lower back pain for 2 days with bilateral lower extremity weakness and urinary incontinence. His post residual was 300 mL.   Plan is for MRI of his lumbar spine.  8:07 AM  Reassessed pt - patient's blood pressure is elevated but suspect this is due to his pain. He is tender to palpation diffusely over the lumbar spine. No C-spine or T. spine tenderness. His left lower extremity weakness has completely resolved. He has no new numbness. He has chronic right upper and lower extremity weakness from a CVA. He has normal rectal tone. No gross blood or melena. He's afebrile. Patient does have acute on chronic renal failure. We'll hydrate. MRI pending but there is concern that we may not be able to obtain a diagnostic MRI due to his aortic repair. Wife also reports that he has had a renal artery stent but I do not find this in the notes. Discussed with family that if this is cauda equina and we are unable to obtain a diagnostic MRI of his lumbar spine, or only other option is a CT myelogram. Given his chronic kidney disease, suspect he would need dialysis after a contrast load. Family and patient report they are not interested in having this study performed.  9:50 AM  Patient's pain is controlled. His blood pressure has improved. His MRI shows no specific process to account for patient's symptoms. There is mild impingement at L3-L4 and L4-L5 superior chronic. There is some portions of the vertebral body column due to artifact left of L5-S1. Given his extremity numbness and weakness have improved and he has normal rectal tone, I do not feel he needs further imaging at this time. Also discussed with family that even if he does have radiculopathy or cauda equina the risk of surgery with his multiple  comorbidities would be very high and CT myelogram would likely lead to dialysis. They state they're not interested in either at this time. We'll attempt to obtain a urinalysis to rule out urinary tract infection as the cause of his symptoms. Anticipate that patient can be discharged.  Family reports he would like the patient to be placed. Have explained I do not have any medical criteria for inpatient admission at this time. They state that they can pay out of pocket for placement. Discussed with Erasmo Downer with social work who will evaluate the patient in the emergency department.   11:45 AM  Pt still mildly hypertensive. We'll give clonidine and pain medication. Will likely be able to discharge home on Percocet 5/325 mg every 6 hours as needed for pain. His urine shows moderate hemoglobin but 0-2 RBCs. We'll check a CK level given he does have elevation of his creatinine. He is currently getting IV fluids for hydration. Social workers talk to family and is looking into placement.  12:16 PM  Pt's CK is completely normal. He has received a second 500 mL IV fluid bolus. Social work is working on placement for patient. I feel he is safe to be discharged home.   2:23 PM  Pt has been accepted to [facility].  WIll dc to facility. His blood pressure is improved and his pain is controlled.  [place], DO 12/07/13 [place], DO 12/07/13 [id number]

## 2013-12-07 NOTE — Discharge Instructions (Signed)
Back Pain, Adult Low back pain is very common. About 1 in 5 people have back pain.The cause of low back pain is rarely dangerous. The pain often gets better over time.About half of people with a sudden onset of back pain feel better in just 2 weeks. About 8 in 10 people feel better by 6 weeks.  CAUSES Some common causes of back pain include:  Strain of the muscles or ligaments supporting the spine.  Wear and tear (degeneration) of the spinal discs.  Arthritis.  Direct injury to the back. DIAGNOSIS Most of the time, the direct cause of low back pain is not known.However, back pain can be treated effectively even when the exact cause of the pain is unknown.Answering your caregiver's questions about your overall health and symptoms is one of the most accurate ways to make sure the cause of your pain is not dangerous. If your caregiver needs more information, he or she may order lab work or imaging tests (X-rays or MRIs).However, even if imaging tests show changes in your back, this usually does not require surgery. HOME CARE INSTRUCTIONS For many people, back pain returns.Since low back pain is rarely dangerous, it is often a condition that people can learn to [facility] their own.   Remain active. It is stressful on the back to sit or stand in one place. Do not sit, drive, or stand in one place for more than 30 minutes at a time. Take short walks on level surfaces as soon as pain allows.Try to increase the length of time you walk each day.  Do not stay in bed.Resting more than 1 or 2 days can delay your recovery.  Do not avoid exercise or work.Your body is made to move.It is not dangerous to be active, even though your back may hurt.Your back will likely heal faster if you return to being active before your pain is gone.  Pay attention to your body when you bend and lift. Many people have less discomfortwhen lifting if they bend their knees, keep the load close to their bodies,and  avoid twisting. Often, the most comfortable positions are those that put less stress on your recovering back.  Find a comfortable position to sleep. Use a firm mattress and lie on your side with your knees slightly bent. If you lie on your back, put a pillow under your knees.  Only take over-the-counter or prescription medicines as directed by your caregiver. Over-the-counter medicines to reduce pain and inflammation are often the most helpful.Your caregiver may prescribe muscle relaxant drugs.These medicines help dull your pain so you can more quickly return to your normal activities and healthy exercise.  Put ice on the injured area.  Put ice in a plastic bag.  Place a towel between your skin and the bag.  Leave the ice on for 15-20 minutes, 03-04 times a day for the first 2 to 3 days. After that, ice and heat may be alternated to reduce pain and spasms.  Ask your caregiver about trying back exercises and gentle massage. This may be of some benefit.  Avoid feeling anxious or stressed.Stress increases muscle tension and can worsen back pain.It is important to recognize when you are anxious or stressed and learn ways to manage it.Exercise is a great option. SEEK MEDICAL CARE IF:  You have pain that is not relieved with rest or medicine.  You have pain that does not improve in 1 week.  You have new symptoms.  You are generally not feeling well. SEEK  IMMEDIATE MEDICAL CARE IF:   You have pain that radiates from your back into your legs.  You develop new bowel or bladder control problems.  You have unusual weakness or numbness in your arms or legs.  You develop nausea or vomiting.  You develop abdominal pain.  You feel faint. Document Released: 07/22/2005 Document Revised: 01/21/2012 Document Reviewed: 12/10/2010 [facility] Patient Information 2014 [place], [place].  Chronic Kidney Disease Chronic kidney disease occurs when the kidneys are damaged over a long period. The  kidneys are two organs that lie on either side of the spine between the middle of the back and the front of the abdomen. The kidneys:   Remove wastes and extra water from the blood.   Produce important hormones. These help keep bones strong, regulate blood pressure, and help create red blood cells.   Balance the fluids and chemicals in the blood and tissues. A small amount of kidney damage may not cause problems, but a large amount of damage may make it difficult or impossible for the kidneys to work the way they should. If steps are not taken to slow down the kidney damage or stop it from getting worse, the kidneys may stop working permanently. Most of the time, chronic kidney disease does not go away. However, it can often be controlled, and those with the disease can usually live normal lives. CAUSES  The most common causes of chronic kidney disease are diabetes and high blood pressure (hypertension). Chronic kidney disease may also be caused by:   Diseases that cause kidneys' filters to become inflamed.   Diseases that affect the immune system.   Genetic diseases.   Medicines that damage the kidneys, such as anti-inflammatory medicines.  Poisoning or exposure to toxic substances.   A reoccurring kidney or urinary infection.   A problem with urine flow. This may be caused by:   Cancer.   Kidney stones.   An enlarged prostate in males. SYMPTOMS  Because the kidney damage in chronic kidney disease occurs slowly, symptoms develop slowly and may not be obvious until the kidney damage becomes severe. A person may have a kidney disease for years without showing any symptoms. Symptoms can include:   Swelling (edema) of the legs, ankles, or feet.   Tiredness (lethargy).   Nausea or vomiting.   Confusion.   Problems with urination, such as:   Decreased urine production.   Frequent urination, especially at night.   Frequent accidents in children who are  potty trained.   Muscle twitches and cramps.   Shortness of breath.  Weakness.   Persistent itchiness.   Loss of appetite.  Metallic taste in the mouth.  Trouble sleeping.  Slowed development in children.  Short stature in children. DIAGNOSIS  Chronic kidney disease may be detected and diagnosed by tests, including blood, urine, imaging, or kidney biopsy tests.  TREATMENT  Most chronic kidney diseases cannot be cured. Treatment usually involves relieving symptoms and preventing or slowing the progression of the disease. Treatment may include:   A special diet. You may need to avoid alcohol and foods thatare salty and high in potassium.   Medicines. These may:   Lower blood pressure.   Relieve anemia.   Relieve swelling.   Protect the bones. HOME CARE INSTRUCTIONS   Follow your prescribed diet.   Only take over-the-counter or prescription medicines as directed by your caregiver.  Do not take any new medicines (prescription, over-the-counter, or nutritional supplements) unless approved by your caregiver. Many medicines can  worsen your kidney damage or need to have the dose adjusted.   Quit smoking if you are a smoker. Talk to your caregiver about a smoking cessation program.   Keep all follow-up appointments as directed by your caregiver. SEEK IMMEDIATE MEDICAL CARE IF:  Your symptoms get worse or you develop new symptoms.   You develop symptoms of end-stage kidney disease. These include:   Headaches.   Abnormally dark or light skin.   Numbness in the hands or feet.   Easy bruising.   Frequent hiccups.   Menstruation stops.   You have a fever.   You have decreased urine production.   You havepain or bleeding when urinating. MAKE SURE YOU:  Understand these instructions.  Will watch your condition.  Will get help right away if you are not doing well or get worse. FOR MORE INFORMATION  American Association of Kidney  Patients: BombTimer.gl National Kidney Foundation: www.kidney.[place]: https://mathis.com/ [facility]: www.lifeoptions.org and www.kidneyschool.org Document Released: 04/30/2008 Document Revised: 07/08/2012 Document Reviewed: 03/20/2012 [facility] Patient Information 2014 [place], [place].

## 2013-12-07 NOTE — Progress Notes (Addendum)
Pt family interested in assisted living or skilled nursing placement for patient for short term placement. Patient and patient wife are hopeful for patient to be able to return home at some point, however this may end up being long term placement. Pt and pt wife requested [facility] or [facility] for [place].   CSW spoke with Lelan Pons and Gae Bon at [facility] and provided clinical information. Pt accepted to [facility]. Gae Bon from [facility] completed assessment in ED. Patient needs tb skin test that will be read on 12/19/2013 at [facility] on left arm. CSW informed RN who will place PPD skin test on left arm.   Pt to be transported by non emergency ambulance. Transportation arranged for 4pm as requested by [facility] and family. RN aware. Fl2, discharge papers, rx for percocet and ambien, and medical necesity in patient chart.   Noreene Larsson [phone]  ED CSW 12/07/2013 1201pm

## 2013-12-07 NOTE — ED Provider Notes (Signed)
CSN: [id number]     Arrival date & time 12/07/13  0524 History   First MD Initiated Contact with Patient 12/07/13 0531     Chief Complaint  Patient presents with  . Back Pain  . Urinary Retention     (Consider location/radiation/quality/duration/timing/severity/associated sxs/prior Treatment) HPI History provided by patient. History of kidney disease, prostate and bladder cancer, and is wheelchair-bound from previous stroke with right-sided deficits. Patient developed low back pain 2 days ago progressively worsening. This morning developed associated incontinence of stool with worsening lower extremity weakness. No associated numbness. No fevers or chills. He is able to wheel his toes but is still significantly weaker than normal. Brought in by EMS. Symptoms moderate in severity. No history of same.  Past Medical History  Diagnosis Date  . Cancer of bladder 2010    Dr. Lawerance Bach  . Cancer of prostate 1994    Dr. Lawerance Bach  . Depression   . CKD (chronic kidney disease) stage 4, GFR 15-29 ml/min     solitary right kidney, renal vascular disease ([place])  . Hypertension   . TIA (transient ischemic attack) 2008  . PVD (peripheral vascular disease) 02/21/2013  . CVA (cerebral infarction) 02/2013    residual R hemiparesis  . AAA (abdominal aortic aneurysm) 1995    s/p repair   Past Surgical History  Procedure Laterality Date  . Abdominal aortic aneurysm repair  1995  . Prostate surgery  1994  . Colon surgery  2010    fistula repair  . Hernia repair  2005   Family History  Problem Relation Age of Onset  . Cancer Father     prostate  . Heart failure Mother   . Cancer Brother     prostate  . Diabetes Sister   . CAD Neg Hx   . Stroke Neg Hx    History  Substance Use Topics  . Smoking status: Former Smoker -- 1.00 packs/day for 60 years    Quit date: 02/20/2013  . Smokeless tobacco: Never Used  . Alcohol Use: No    Review of Systems  Constitutional: Negative for fever  and chills.  Respiratory: Negative for shortness of breath.   Cardiovascular: Negative for chest pain.  Gastrointestinal: Negative for vomiting and abdominal pain.  Genitourinary: Negative for dysuria.  Musculoskeletal: Positive for back pain.  Skin: Negative for rash.  Neurological: Positive for weakness. Negative for numbness.  All other systems reviewed and are negative.     Allergies  Other and Plaquenil  Home Medications   Prior to Admission medications   Medication Sig Start Date End Date Taking? Authorizing Provider  amLODipine (NORVASC) 10 MG tablet TAKE 1 TABLET EVERY DAY AT NOON, AND A 2ND TABLET ON RANDOM DAYS 2-3 TIMES A WEEK IN THE EVENING 07/07/13   Timoteo Gaul, FNP  atorvastatin (LIPITOR) 20 MG tablet Take 1 tablet (20 mg total) by mouth daily at 6 PM. 09/23/13   Ria Bush, MD  cloNIDine (CATAPRES) 0.1 MG tablet Take 1 tablet (0.1 mg total) by mouth 3 (three) times daily. 03/26/13   Lavon Paganini Angiulli, PA-C  clopidogrel (PLAVIX) 75 MG tablet Take 1 tablet (75 mg total) by mouth daily with breakfast. 09/23/13   Ria Bush, MD  escitalopram (LEXAPRO) 20 MG tablet  09/10/13   Historical Provider, MD  flavoxATE (URISPAS) 100 MG tablet TAKE 1 TABLET BY MOUTH THREE TIMES DAILY 07/07/13   Timoteo Gaul, FNP  metoprolol tartrate (LOPRESSOR) 25 MG tablet TAKE 1/2 TABLET  BY MOUTH TWICE DAILY 08/04/13   Timoteo Gaul, FNP  sertraline (ZOLOFT) 100 MG tablet Take 1 tablet (100 mg total) by mouth daily. 09/23/13   Ria Bush, MD  tiZANidine (ZANAFLEX) 4 MG tablet Take 0.5 tablets (2 mg total) by mouth every 8 (eight) hours as needed. 05/14/13   Charlett Blake, MD  zolpidem (AMBIEN) 5 MG tablet Take 1 tablet (5 mg total) by mouth at bedtime. 05/04/13   Timoteo Gaul, FNP   BP 183/93  Pulse 77  Temp(Src) 97.8 F (36.6 C) (Oral)  Resp 20  SpO2 100% Physical Exam  Nursing note and vitals reviewed. Constitutional: He is oriented to person, place,  and time. He appears well-developed and well-nourished.  HENT:  Head: Normocephalic and atraumatic.  Eyes: EOM are normal. Pupils are equal, round, and reactive to light.  Neck: Neck supple.  Cardiovascular: Normal rate, regular rhythm and intact distal pulses.   Pulmonary/Chest: Effort normal and breath sounds normal. No respiratory distress.  Abdominal: Soft. Bowel sounds are normal. He exhibits no distension. There is no rebound and no guarding.  Nontender throughout including over suprapubic region without fullness or bladder distention. Bedside bladder scan estimates 300 cc  Musculoskeletal:  No thoracic or lumbar spine tenderness or deformity. Baseline right-sided deficits from previous stroke. Decreased range of motion bilateral lower extremities right more so than left, is able to wiggle his toes but unable to lift either left or right leg off the bed.   Neurological: He is alert and oriented to person, place, and time.  Skin: Skin is warm and dry.    ED Course  Procedures (including critical care time) Labs Review Labs Reviewed  CBC - Abnormal; Notable for the following:    Hemoglobin 12.9 (*)    HCT 37.6 (*)    All other components within normal limits  COMPREHENSIVE METABOLIC PANEL - Abnormal; Notable for the following:    BUN 46 (*)    Creatinine, Ser 3.57 (*)    Albumin 3.1 (*)    GFR calc non Af Amer 15 (*)    GFR calc Af Amer 18 (*)    All other components within normal limits  I-STAT CHEM 8, ED - Abnormal; Notable for the following:    BUN 43 (*)    Creatinine, Ser 3.80 (*)    All other components within normal limits    Imaging Review Dg Lumbar Spine Complete  12/07/2013   CLINICAL DATA:  Low back pain radiating to right leg.  EXAM: LUMBAR SPINE - COMPLETE 4+ VIEW  COMPARISON:  None.  FINDINGS: Lumbar vertebral bodies are intact with maintenance of lumbar lordosis. Minimal grade 1 L4-5 retrolisthesis, without pars interarticularis defects seen, though evaluation  is somewhat limited by endovascular stent graft. Mild-to-moderate L1-2 through L4-5 degenerative disc disease. Mild lower lumbar facet arthropathy. No destructive bony lesions. Surgical clips in the pelvis.  IMPRESSION: Degenerative change of the lumbar spine without acute fracture. Minimal grade 1 L4-5 retrolisthesis on degenerative basis.   Electronically Signed   By: Elon Alas   On: 12/07/2013 06:11     Date: 12/07/2013  Rate: 79  Rhythm: normal sinus rhythm  QRS Axis: normal  Intervals: normal  ST/T Wave abnormalities: nonspecific ST changes  Conduction Disutrbances:none  Narrative Interpretation:   Old EKG Reviewed: unchanged  IV fentanyl prn  MDM   Diagnosis: Low back pain, fecal incontinence, lower extremity weakness  Patient with history of previous stroke and right-sided deficits. She also has  history of prostate and bladder cancer. Concerning history and presentation. MRI ordered/ pending at 7 AM - DR Ward to follow    Teressa Lower, MD 12/07/13 [phone]

## 2013-12-07 NOTE — ED Notes (Signed)
Pt has returned from MRI. 

## 2013-12-07 NOTE — Progress Notes (Signed)
CSW spoke with EDP, who shared that patient is in need of placement. CSW to meet with pt and pt wife to discuss.   Noreene Larsson [phone]  ED CSW 12/07/2013 1019am

## 2013-12-07 NOTE — ED Notes (Signed)
Patient transported to MRI 

## 2013-12-07 NOTE — ED Notes (Signed)
Dr. Leonides Schanz checked rectal tone and rectal temp while in patient's rooms.  Discussing treatment options with patient and family.

## 2013-12-07 NOTE — ED Notes (Signed)
Per EMS report: pt from home: pt work up this morning and had increased pain in his lower back.  Pt also became incontinent.  Pt hx of CVA with deficits on his right side.  Pt having decreased movement in legs.  Pt a/o x 4. Skin warm and dry. Pt wheelchair bound.

## 2013-12-07 NOTE — ED Notes (Signed)
Family concerned that MD know about Zenith stent that was placed for repair of abdominal aneurysm in 1995.  Dr. Leonides Schanz notified. MRI notified.

## 2013-12-07 NOTE — ED Notes (Signed)
Bed: WA06 Expected date:  Expected time:  Means of arrival:  Comments: EMS back pain, incontence

## 2013-12-08 ENCOUNTER — Telehealth: Payer: Self-pay | Admitting: *Deleted

## 2013-12-08 ENCOUNTER — Ambulatory Visit: Payer: Medicare Other | Admitting: Podiatry

## 2013-12-08 LAB — URINE CULTURE
Colony Count: NO GROWTH
Culture: NO GROWTH

## 2013-12-08 NOTE — Telephone Encounter (Signed)
[place] notified and order faxed as well.

## 2013-12-08 NOTE — Telephone Encounter (Signed)
Ok to give verbal order for this but ALF doc should be able to give order as well.

## 2013-12-08 NOTE — Telephone Encounter (Signed)
Needs verbal order for PT and OT for decreased mobility. Eval and treat. Time frame to be determined. He is in ALF now at [facility] now due to declining health and his wife being unable to care for him at home.

## 2013-12-09 ENCOUNTER — Telehealth: Payer: Self-pay | Admitting: *Deleted

## 2013-12-09 NOTE — Telephone Encounter (Signed)
PT Plan of care in your IN box for signature

## 2013-12-09 NOTE — Telephone Encounter (Signed)
signed and placed in my out box 

## 2013-12-12 ENCOUNTER — Encounter: Payer: Self-pay | Admitting: Family Medicine

## 2013-12-12 ENCOUNTER — Other Ambulatory Visit: Payer: Self-pay | Admitting: Family Medicine

## 2013-12-12 DIAGNOSIS — I10 Essential (primary) hypertension: Secondary | ICD-10-CM

## 2013-12-12 DIAGNOSIS — E785 Hyperlipidemia, unspecified: Secondary | ICD-10-CM

## 2013-12-12 DIAGNOSIS — N184 Chronic kidney disease, stage 4 (severe): Secondary | ICD-10-CM

## 2013-12-13 ENCOUNTER — Telehealth: Payer: Self-pay | Admitting: Emergency Medicine

## 2013-12-13 ENCOUNTER — Telehealth: Payer: Self-pay | Admitting: Family Medicine

## 2013-12-13 NOTE — Telephone Encounter (Signed)
Returned call to pt. Wife.  Pt is now in assisted living for the next month.  He will require OT/PT, we will call back the second wk of June to ck status of pt and r/s appts. here .   Told wife to make sure that they get a f/u appt. W/ Dr Mercy Moore  To f/u creat. Level.  Janith Lima, EMT 12/13/2013 11:18 AM

## 2013-12-13 NOTE — Telephone Encounter (Signed)
Pt had labs on 12/07/13 at [facility].  Pt's wife had to cancel his lab appt for 12/14/13 because he is staying in assisted living for now.  If he is unable to come on his 12/21/13  Can his wife still come to discuss his healthcare?

## 2013-12-14 ENCOUNTER — Other Ambulatory Visit: Payer: Medicare HMO

## 2013-12-14 NOTE — Telephone Encounter (Signed)
Message left advising patient's wife. 

## 2013-12-14 NOTE — Telephone Encounter (Signed)
Noted.  Ok for her to come but she shouldn't get charged a copay.

## 2013-12-20 ENCOUNTER — Observation Stay (HOSPITAL_COMMUNITY)
Admission: EM | Admit: 2013-12-20 | Discharge: 2013-12-22 | Disposition: A | Discharge status: Skilled Nursing Facility | Payer: No Typology Code available for payment source | Attending: Internal Medicine | Admitting: Internal Medicine

## 2013-12-20 ENCOUNTER — Telehealth: Payer: Self-pay

## 2013-12-20 ENCOUNTER — Encounter (HOSPITAL_COMMUNITY): Payer: Self-pay | Admitting: Emergency Medicine

## 2013-12-20 DIAGNOSIS — F329 Major depressive disorder, single episode, unspecified: Secondary | ICD-10-CM | POA: Insufficient documentation

## 2013-12-20 DIAGNOSIS — R809 Proteinuria, unspecified: Secondary | ICD-10-CM

## 2013-12-20 DIAGNOSIS — Z7902 Long term (current) use of antithrombotics/antiplatelets: Secondary | ICD-10-CM | POA: Insufficient documentation

## 2013-12-20 DIAGNOSIS — L299 Pruritus, unspecified: Secondary | ICD-10-CM

## 2013-12-20 DIAGNOSIS — Z87891 Personal history of nicotine dependence: Secondary | ICD-10-CM | POA: Insufficient documentation

## 2013-12-20 DIAGNOSIS — N189 Chronic kidney disease, unspecified: Secondary | ICD-10-CM

## 2013-12-20 DIAGNOSIS — I129 Hypertensive chronic kidney disease with stage 1 through stage 4 chronic kidney disease, or unspecified chronic kidney disease: Secondary | ICD-10-CM | POA: Insufficient documentation

## 2013-12-20 DIAGNOSIS — I639 Cerebral infarction, unspecified: Secondary | ICD-10-CM

## 2013-12-20 DIAGNOSIS — Z681 Body mass index (BMI) 19 or less, adult: Secondary | ICD-10-CM | POA: Insufficient documentation

## 2013-12-20 DIAGNOSIS — G8110 Spastic hemiplegia affecting unspecified side: Secondary | ICD-10-CM

## 2013-12-20 DIAGNOSIS — J438 Other emphysema: Secondary | ICD-10-CM

## 2013-12-20 DIAGNOSIS — N179 Acute kidney failure, unspecified: Principal | ICD-10-CM

## 2013-12-20 DIAGNOSIS — F3289 Other specified depressive episodes: Secondary | ICD-10-CM | POA: Insufficient documentation

## 2013-12-20 DIAGNOSIS — I739 Peripheral vascular disease, unspecified: Secondary | ICD-10-CM

## 2013-12-20 DIAGNOSIS — I69959 Hemiplegia and hemiparesis following unspecified cerebrovascular disease affecting unspecified side: Secondary | ICD-10-CM | POA: Insufficient documentation

## 2013-12-20 DIAGNOSIS — E785 Hyperlipidemia, unspecified: Secondary | ICD-10-CM

## 2013-12-20 DIAGNOSIS — I10 Essential (primary) hypertension: Secondary | ICD-10-CM

## 2013-12-20 DIAGNOSIS — N184 Chronic kidney disease, stage 4 (severe): Secondary | ICD-10-CM

## 2013-12-20 DIAGNOSIS — Z8546 Personal history of malignant neoplasm of prostate: Secondary | ICD-10-CM | POA: Insufficient documentation

## 2013-12-20 DIAGNOSIS — E43 Unspecified severe protein-calorie malnutrition: Secondary | ICD-10-CM

## 2013-12-20 DIAGNOSIS — E860 Dehydration: Secondary | ICD-10-CM | POA: Insufficient documentation

## 2013-12-20 DIAGNOSIS — R627 Adult failure to thrive: Secondary | ICD-10-CM | POA: Insufficient documentation

## 2013-12-20 DIAGNOSIS — Z8551 Personal history of malignant neoplasm of bladder: Secondary | ICD-10-CM | POA: Insufficient documentation

## 2013-12-20 DIAGNOSIS — F32A Depression, unspecified: Secondary | ICD-10-CM

## 2013-12-20 LAB — URINALYSIS, ROUTINE W REFLEX MICROSCOPIC
GLUCOSE, UA: NEGATIVE mg/dL
KETONES UR: NEGATIVE mg/dL
Leukocytes, UA: NEGATIVE
Nitrite: NEGATIVE
PH: 5.5 (ref 5.0–8.0)
Specific Gravity, Urine: 1.024 (ref 1.005–1.030)
Urobilinogen, UA: 0.2 mg/dL (ref 0.0–1.0)

## 2013-12-20 LAB — CBC WITH DIFFERENTIAL/PLATELET
Basophils Absolute: 0 10*3/uL (ref 0.0–0.1)
Basophils Relative: 0 % (ref 0–1)
EOS PCT: 0 % (ref 0–5)
Eosinophils Absolute: 0 10*3/uL (ref 0.0–0.7)
HEMATOCRIT: 39.4 % (ref 39.0–52.0)
Hemoglobin: 13.8 g/dL (ref 13.0–17.0)
LYMPHS ABS: 1.3 10*3/uL (ref 0.7–4.0)
LYMPHS PCT: 12 % (ref 12–46)
MCH: 29.5 pg (ref 26.0–34.0)
MCHC: 35 g/dL (ref 30.0–36.0)
MCV: 84.2 fL (ref 78.0–100.0)
MONO ABS: 1 10*3/uL (ref 0.1–1.0)
Monocytes Relative: 9 % (ref 3–12)
Neutro Abs: 8.5 10*3/uL — ABNORMAL HIGH (ref 1.7–7.7)
Neutrophils Relative %: 79 % — ABNORMAL HIGH (ref 43–77)
Platelets: 395 10*3/uL (ref 150–400)
RBC: 4.68 MIL/uL (ref 4.22–5.81)
RDW: 13.4 % (ref 11.5–15.5)
WBC: 10.8 10*3/uL — ABNORMAL HIGH (ref 4.0–10.5)

## 2013-12-20 LAB — COMPREHENSIVE METABOLIC PANEL
ALT: 7 U/L (ref 0–53)
AST: 8 U/L (ref 0–37)
Albumin: 3.4 g/dL — ABNORMAL LOW (ref 3.5–5.2)
Alkaline Phosphatase: 95 U/L (ref 39–117)
BILIRUBIN TOTAL: 0.3 mg/dL (ref 0.3–1.2)
BUN: 54 mg/dL — AB (ref 6–23)
CALCIUM: 9.8 mg/dL (ref 8.4–10.5)
CHLORIDE: 102 meq/L (ref 96–112)
CO2: 18 meq/L — AB (ref 19–32)
CREATININE: 4.04 mg/dL — AB (ref 0.50–1.35)
GFR calc Af Amer: 15 mL/min — ABNORMAL LOW (ref >90)
GFR, EST NON AFRICAN AMERICAN: 13 mL/min — AB (ref >90)
GLUCOSE: 120 mg/dL — AB (ref 70–99)
Potassium: 3.7 mEq/L (ref 3.7–5.3)
Sodium: 138 mEq/L (ref 137–147)
Total Protein: 7.5 g/dL (ref 6.0–8.3)

## 2013-12-20 LAB — URINE MICROSCOPIC-ADD ON

## 2013-12-20 LAB — AMMONIA: Ammonia: 15 umol/L (ref 11–60)

## 2013-12-20 LAB — TSH: TSH: 0.966 u[IU]/mL (ref 0.350–4.500)

## 2013-12-20 LAB — I-STAT CG4 LACTIC ACID, ED: Lactic Acid, Venous: 0.84 mmol/L (ref 0.5–2.2)

## 2013-12-20 MED ORDER — CLONIDINE HCL 0.1 MG PO TABS
0.1000 mg | ORAL_TABLET | Freq: Three times a day (TID) | ORAL | Status: DC
  Administered 2013-12-20 – 2013-12-21 (×2): 0.1 mg via ORAL
  Filled 2013-12-20 (×4): qty 1

## 2013-12-20 MED ORDER — TIZANIDINE HCL 2 MG PO TABS
2.0000 mg | ORAL_TABLET | Freq: Four times a day (QID) | ORAL | Status: DC | PRN
  Filled 2013-12-20: qty 1

## 2013-12-20 MED ORDER — ACETAMINOPHEN 325 MG PO TABS: 650.0000 mg | ORAL_TABLET | Freq: Four times a day (QID) | ORAL | Status: DC | PRN

## 2013-12-20 MED ORDER — CLOPIDOGREL BISULFATE 75 MG PO TABS
75.0000 mg | ORAL_TABLET | Freq: Every day | ORAL | Status: DC
  Administered 2013-12-21 – 2013-12-22 (×2): 75 mg via ORAL
  Filled 2013-12-20 (×3): qty 1

## 2013-12-20 MED ORDER — SODIUM CHLORIDE 0.9 % IV BOLUS (SEPSIS)
1000.0000 mL | Freq: Once | INTRAVENOUS | Status: AC
  Administered 2013-12-20: 1000 mL via INTRAVENOUS

## 2013-12-20 MED ORDER — ATORVASTATIN CALCIUM 20 MG PO TABS
20.0000 mg | ORAL_TABLET | Freq: Every day | ORAL | Status: DC
  Administered 2013-12-21: 20 mg via ORAL
  Filled 2013-12-20 (×2): qty 1

## 2013-12-20 MED ORDER — HYDRALAZINE HCL 20 MG/ML IJ SOLN
10.0000 mg | Freq: Four times a day (QID) | INTRAMUSCULAR | Status: DC | PRN
  Administered 2013-12-20 – 2013-12-22 (×3): 10 mg via INTRAVENOUS
  Filled 2013-12-20 (×3): qty 1

## 2013-12-20 MED ORDER — AMLODIPINE BESYLATE 10 MG PO TABS
10.0000 mg | ORAL_TABLET | Freq: Every day | ORAL | Status: DC
  Administered 2013-12-21 – 2013-12-22 (×2): 10 mg via ORAL
  Filled 2013-12-20 (×2): qty 1

## 2013-12-20 MED ORDER — SODIUM CHLORIDE 0.9 % IV SOLN
INTRAVENOUS | Status: DC
  Administered 2013-12-20 – 2013-12-21 (×2): via INTRAVENOUS

## 2013-12-20 MED ORDER — SERTRALINE HCL 100 MG PO TABS
100.0000 mg | ORAL_TABLET | Freq: Every day | ORAL | Status: DC
  Administered 2013-12-21 – 2013-12-22 (×2): 100 mg via ORAL
  Filled 2013-12-20 (×2): qty 1

## 2013-12-20 MED ORDER — ACETAMINOPHEN 650 MG RE SUPP: 650.0000 mg | Freq: Four times a day (QID) | RECTAL | Status: DC | PRN

## 2013-12-20 MED ORDER — HEPARIN SODIUM (PORCINE) 5000 UNIT/ML IJ SOLN
5000.0000 [IU] | Freq: Three times a day (TID) | INTRAMUSCULAR | Status: DC
  Administered 2013-12-20 – 2013-12-22 (×5): 5000 [IU] via SUBCUTANEOUS
  Filled 2013-12-20 (×8): qty 1

## 2013-12-20 MED ORDER — LABETALOL HCL 5 MG/ML IV SOLN
10.0000 mg | INTRAVENOUS | Status: DC | PRN
  Administered 2013-12-20: 10 mg via INTRAVENOUS
  Filled 2013-12-20 (×2): qty 4

## 2013-12-20 MED ORDER — OXYCODONE-ACETAMINOPHEN 5-325 MG PO TABS: 1.0000 | ORAL_TABLET | Freq: Four times a day (QID) | ORAL | Status: DC | PRN

## 2013-12-20 MED ORDER — FLAVOXATE HCL 100 MG PO TABS
100.0000 mg | ORAL_TABLET | Freq: Every day | ORAL | Status: DC
  Administered 2013-12-21 – 2013-12-22 (×2): 100 mg via ORAL
  Filled 2013-12-20 (×2): qty 1

## 2013-12-20 MED ORDER — METOPROLOL TARTRATE 12.5 MG HALF TABLET
12.5000 mg | ORAL_TABLET | Freq: Two times a day (BID) | ORAL | Status: DC
  Administered 2013-12-20 – 2013-12-21 (×2): 12.5 mg via ORAL
  Filled 2013-12-20 (×3): qty 1

## 2013-12-20 MED ORDER — RESOURCE THICKENUP CLEAR PO POWD
ORAL | Status: DC | PRN
  Filled 2013-12-20: qty 125

## 2013-12-20 NOTE — ED Notes (Signed)
Per EMS: Pt from [facility] with c/o weakness x 1 week with N/V yesterday. Staff also concerned because pt noted to be hypertensive after BP medications this AM, 170/102. Pt is AO at baseline, hx of CVA with right sided deficits. Denies pain. 152/100. 97% RA. 74 NSR. 115 CBG. 16 RR.

## 2013-12-20 NOTE — ED Notes (Signed)
Gave report to RN in [place]. Pt will move to C [address]

## 2013-12-20 NOTE — H&P (Addendum)
[facility] History and Physical  Kevin Escobar BJY:[id number] DOB: 06-09-1941 DOA: 12/20/2013  Referring physician: er PCP: Ria Bush, MD   Chief Complaint:   HPI: Kevin Escobar is a 73 y.o. male  From [facility].  Recently placed there after becoming so weak, the wife was unable to care for patient.   On 4/23 he received a botox injection for muscle spasm 5/5 he went to the ER for back pain and urinary retention.   Today, at [facility] patient was c/o weakness worsening- N/V and patient was noticed to be hypertensive with BP of 172/102.     In the ER, his Cr was elevated above BP and wife was worries about severity of weakness.  He was due for a U/S of his kidneys per Dr. Harrietta Guardian.    Review of Systems:  All systems reviewed, negative unless stated above    Past Medical History  Diagnosis Date  . Cancer of bladder 2010    Dr. Lawerance Bach  . Cancer of prostate 1994    Dr. Lawerance Bach  . Depression   . CKD (chronic kidney disease) stage 4, GFR 15-29 ml/min     solitary right kidney, renal vascular disease ([place])  . Hypertension   . TIA (transient ischemic attack) 2008  . PVD (peripheral vascular disease) 02/21/2013  . CVA (cerebral infarction) 02/2013    residual R hemiparesis  . AAA (abdominal aortic aneurysm) 1995    s/p repair  . Dyslipidemia    Past Surgical History  Procedure Laterality Date  . Abdominal aortic aneurysm repair  1995  . Prostate surgery  1994  . Colon surgery  2010    fistula repair  . Hernia repair  2005   Social History:  reports that he quit smoking about 9 months ago. He has never used smokeless tobacco. He reports that he does not drink alcohol or use illicit drugs.  Allergies  Allergen Reactions  . Other Rash    "Cheerios cause me to break out--bumps all over my chest"  . Plaquenil [Hydroxychloroquine Sulfate] Rash    Severe rash    Family History  Problem Relation Age of Onset  . Cancer Father     prostate  . Heart failure Mother   . Cancer Brother     prostate  . Diabetes Sister   . CAD Neg Hx   . Stroke Neg Hx      Prior to Admission medications   Medication Sig Start Date End Date Taking? Authorizing Provider  amLODipine (NORVASC) 10 MG tablet Take 10 mg by mouth daily.    Yes Historical Provider, MD  atorvastatin (LIPITOR) 20 MG tablet Take 20 mg by mouth daily. Take one tablet by mouth daily @@ 6 pm.   Yes Historical Provider, MD  cloNIDine (CATAPRES) 0.1 MG tablet Take 0.1 mg by mouth 3 (three) times daily. 03/26/13  Yes Daniel J Angiulli, PA-C  clopidogrel (PLAVIX) 75 MG tablet Take 75 mg by mouth daily with breakfast.   Yes Historical Provider, MD  flavoxATE (URISPAS) 100 MG tablet Take 100 mg by mouth daily.   Yes Historical Provider, MD  metoprolol tartrate (LOPRESSOR) 25 MG tablet Take 12.5 mg by mouth 2 (two) times daily.   Yes Historical Provider, MD  oxyCODONE-acetaminophen (PERCOCET/ROXICET) 5-325 MG per tablet Take 1 tablet by mouth every 6 (six) hours as needed for moderate pain. 12/07/13  Yes Kristen N Ward, DO  sertraline (ZOLOFT) 100 MG tablet Take 100 mg by mouth  daily. 09/23/13  Yes Ria Bush, MD  tiZANidine (ZANAFLEX) 4 MG tablet Take 2 mg by mouth every 6 (six) hours as needed for muscle spasms.   Yes Historical Provider, MD  zolpidem (AMBIEN) 5 MG tablet Take 5 mg by mouth at bedtime.   Yes Historical Provider, MD   Physical Exam: Filed Vitals:   12/20/13 1600  BP: 185/99  Pulse: 85  Temp:   Resp: 14    BP 185/99  Pulse 85  Temp(Src) 97.9 F (36.6 C) (Oral)  Resp 14  SpO2 97%  General:  Appears calm and comfortable- chronically ill Eyes: PERRL, normal lids, irises & conjunctiva ENT: grossly normal hearing, lips & tongue Neck: no LAD, masses or thyromegaly Cardiovascular: RRR, no m/r/g. No LE edema. Respiratory: CTA bilaterally, no w/r/r. Normal respiratory effort. Abdomen: soft, ntnd Skin: no rash or induration seen on limited  exam Musculoskeletal: right sided residual weakness Psychiatric: grossly normal mood and affect, speech fluent and appropriate Neurologic: right sided weakness          Labs on Admission:  Basic Metabolic Panel:  Recent Labs Lab 12/20/13 1410  NA 138  K 3.7  CL 102  CO2 18*  GLUCOSE 120*  BUN 54*  CREATININE 4.04*  CALCIUM 9.8   Liver Function Tests:  Recent Labs Lab 12/20/13 1410  AST 8  ALT 7  ALKPHOS 95  BILITOT 0.3  PROT 7.5  ALBUMIN 3.4*   No results found for this basename: LIPASE, AMYLASE,  in the last 168 hours  Recent Labs Lab 12/20/13 1410  AMMONIA 15   CBC:  Recent Labs Lab 12/20/13 1410  WBC 10.8*  NEUTROABS 8.5*  HGB 13.8  HCT 39.4  MCV 84.2  PLT 395   Cardiac Enzymes: No results found for this basename: CKTOTAL, CKMB, CKMBINDEX, TROPONINI,  in the last 168 hours  BNP (last 3 results) No results found for this basename: PROBNP,  in the last 8760 hours CBG: No results found for this basename: GLUCAP,  in the last 168 hours  Radiological Exams on Admission: No results found.    Assessment/Plan Active Problems:   Hypertension   CVA (cerebral vascular accident)   Spastic hemiplegia affecting dominant side   Acute on chronic renal failure   Proteinuria   AKI on CKD- IVF (gentle), nephrology consult- will see in the AM, renal U/S  Proteinuria- ?due to HTN  uncontrolled HTN- avoid nephrotoxic agents  Weakness- overall with residual right side from CVA- PT/OT eval, recent botox injection for muscle spatic hemiplegia From [facility]  N/V- resolved, ? BP vs renal function   Side bar with neuro (even if botox was cause of weakness- supportive care is treatment)  Code Status: full Family Communication: wife at bedside Disposition Plan: obs  Time spent: 75 min  [facility] Pager [phone]

## 2013-12-20 NOTE — ED Notes (Signed)
Bladder scanned pt- 263mL. Pt states he does not feel like he needs to void at the moment.

## 2013-12-20 NOTE — Telephone Encounter (Signed)
Dr Danise Mina advised pt to be evaluated at ED. Crystal notified and voice understanding.

## 2013-12-20 NOTE — ED Notes (Signed)
Gave pt sprite and crackers 

## 2013-12-20 NOTE — Telephone Encounter (Signed)
Noted. Pt has been admitted. Will follow along. plz call wife tomorrow to verify ok to cancel tomorrow's appt (she had mentioned she wanted to come in to discuss husband's case).

## 2013-12-20 NOTE — Telephone Encounter (Signed)
Kevin Escobar with [facility] vomited x 2 last night; this morning pt is nauseated (no vomiting),woozy,dizzy and red faced; no H/A CP or SOB. Today BP 160/100 pt was given amlodipine 10 mg and metoprolol 12.5 mg and bp rechecked then BP 170/102. Pt has appt tomorrow for CPX. Kevin Escobar wants to know if should be seen or send to ED.

## 2013-12-20 NOTE — ED Provider Notes (Signed)
CSN: [id number]     Arrival date & time 12/20/13  1209 History   First MD Initiated Contact with Patient 12/20/13 1237     Chief Complaint  Patient presents with  . Hypertension  . Fatigue     (Consider location/radiation/quality/duration/timing/severity/associated sxs/prior Treatment) Patient is a 73 y.o. male presenting with vomiting. The history is provided by the nursing home and a relative.  Emesis Severity:  Mild Duration:  1 day Timing:  Sporadic Number of daily episodes:  1 Quality:  Stomach contents Progression:  Resolved Chronicity:  New Recent urination:  Normal Relieved by:  Nothing Worsened by:  Nothing tried Ineffective treatments:  None tried Associated symptoms: no abdominal pain, no cough, no diarrhea and no fever   Risk factors comment:  Prior stroke and ongoing increased lethargy over a month   Past Medical History  Diagnosis Date  . Cancer of bladder 2010    Dr. Lawerance Bach  . Cancer of prostate 1994    Dr. Lawerance Bach  . Depression   . CKD (chronic kidney disease) stage 4, GFR 15-29 ml/min     solitary right kidney, renal vascular disease ([place])  . Hypertension   . TIA (transient ischemic attack) 2008  . PVD (peripheral vascular disease) 02/21/2013  . CVA (cerebral infarction) 02/2013    residual R hemiparesis  . AAA (abdominal aortic aneurysm) 1995    s/p repair  . Dyslipidemia    Past Surgical History  Procedure Laterality Date  . Abdominal aortic aneurysm repair  1995  . Prostate surgery  1994  . Colon surgery  2010    fistula repair  . Hernia repair  2005   Family History  Problem Relation Age of Onset  . Cancer Father     prostate  . Heart failure Mother   . Cancer Brother     prostate  . Diabetes Sister   . CAD Neg Hx   . Stroke Neg Hx    History  Substance Use Topics  . Smoking status: Former Smoker -- 1.00 packs/day for 60 years    Quit date: 02/20/2013  . Smokeless tobacco: Never Used  . Alcohol Use: No    Review of  Systems  Constitutional: Positive for appetite change and fatigue. Negative for fever and unexpected weight change.  Gastrointestinal: Positive for vomiting. Negative for abdominal pain and diarrhea.  Neurological: Positive for weakness (chronic right sided). Negative for speech difficulty and numbness.  All other systems reviewed and are negative.     Allergies  Other and Plaquenil  Home Medications   Prior to Admission medications   Medication Sig Start Date End Date Taking? Authorizing Provider  amLODipine (NORVASC) 10 MG tablet Take 10 mg by mouth See admin instructions. Take 1 tablet daily and 1 additional tablet 2-3 times a week    Historical Provider, MD  cloNIDine (CATAPRES) 0.1 MG tablet Take 1 tablet (0.1 mg total) by mouth 3 (three) times daily. 03/26/13   Lavon Paganini Angiulli, PA-C  metoprolol tartrate (LOPRESSOR) 25 MG tablet Take 12.5 mg by mouth 2 (two) times daily.    Historical Provider, MD  oxyCODONE-acetaminophen (PERCOCET/ROXICET) 5-325 MG per tablet Take 1 tablet by mouth every 6 (six) hours as needed. 12/07/13   Kristen N Ward, DO  sertraline (ZOLOFT) 100 MG tablet Take 1 tablet (100 mg total) by mouth daily. 09/23/13   Ria Bush, MD   BP 168/98  Pulse 81  Temp(Src) 97.9 F (36.6 C) (Oral)  Resp 16  SpO2  100% Physical Exam  Constitutional: He is oriented to person, place, and time. He appears well-developed and well-nourished. No distress.  HENT:  Head: Normocephalic and atraumatic.  Eyes: Conjunctivae are normal.  Neck: Neck supple. No tracheal deviation present.  Cardiovascular: Normal rate, regular rhythm and normal heart sounds.   Pulmonary/Chest: Effort normal and breath sounds normal. No respiratory distress.  Abdominal: Soft. He exhibits no distension. There is no tenderness.  Neurological: He is alert and oriented to person, place, and time.  Right-sided facial droop and weakness of upper and lower extremities. At baseline per his wife. Left greater  than right anisocoria  Skin: Skin is warm and dry.  Psychiatric: He has a normal mood and affect. His speech is slurred. He is slowed and withdrawn.    ED Course  Procedures (including critical care time) Labs Review Labs Reviewed  CBC WITH DIFFERENTIAL - Abnormal; Notable for the following:    WBC 10.8 (*)    Neutrophils Relative % 79 (*)    Neutro Abs 8.5 (*)    All other components within normal limits  URINALYSIS, ROUTINE W REFLEX MICROSCOPIC - Abnormal; Notable for the following:    Hgb urine dipstick MODERATE (*)    Bilirubin Urine SMALL (*)    Protein, ur >300 (*)    All other components within normal limits  COMPREHENSIVE METABOLIC PANEL - Abnormal; Notable for the following:    CO2 18 (*)    Glucose, Bld 120 (*)    BUN 54 (*)    Creatinine, Ser 4.04 (*)    Albumin 3.4 (*)    GFR calc non Af Amer 13 (*)    GFR calc Af Amer 15 (*)    All other components within normal limits  URINE MICROSCOPIC-ADD ON - Abnormal; Notable for the following:    Bacteria, UA FEW (*)    Casts GRANULAR CAST (*)    All other components within normal limits  AMMONIA  I-STAT CG4 LACTIC ACID, ED    Imaging Review No results found.   EKG Interpretation None      MDM   Final diagnoses:  Acute on chronic renal failure   73 y.o. male presents with emesis and hypertension that started today and concerned the staff at his rehabilitation facility and prompted an evaluation in the emergency department today. He has long-standing hypertension and is on multiple medications to control it. The facility gave him his clonidine and did not fix the pressure, the nursing staff contacted the on-call physician for the group and he recommended an emergency department evaluation. On arrival the patient has persistent right side neurologic deficits that appear to be his baseline. His wife is at bedside and is concerned for a month long rapid decline in function including inability to walk and worsening  appetite with softened speech.  Screening labs were obtained in order to assess for acute on chronic illness in this patient with a history of chronic kidney disease and long-standing hypertension. His blood pressure is elevated but not to a level that would warrant acutely lowering it in the emergency department.   Patient's creatinine and BUN been steadily climbing over the last several months according to chart review, the last reading we have available is 3.8 for his creatinine today is 4. His BUN is now greater than 50 and could be contributing to his steady decline of mental function and slowing. Will admit for IV fluids in the setting of likely dehydration secondary to poor oral intake and  acute on chronic renal failure. Remained hemodynamically stable in the emergency department and admitted following an IV fluid bolus.   Leo Grosser, MD 12/20/13 [phone]

## 2013-12-20 NOTE — ED Notes (Signed)
Notified MD that pt's BP still elevated an hour after receiving Hydralazine. Received order to give pt labetalol.

## 2013-12-20 NOTE — ED Notes (Signed)
Notified phlebotomy of labs needing to be drawn.

## 2013-12-21 ENCOUNTER — Observation Stay (HOSPITAL_COMMUNITY): Payer: No Typology Code available for payment source

## 2013-12-21 ENCOUNTER — Encounter: Payer: Medicare HMO | Admitting: Family Medicine

## 2013-12-21 DIAGNOSIS — E785 Hyperlipidemia, unspecified: Secondary | ICD-10-CM

## 2013-12-21 DIAGNOSIS — N184 Chronic kidney disease, stage 4 (severe): Secondary | ICD-10-CM

## 2013-12-21 DIAGNOSIS — I739 Peripheral vascular disease, unspecified: Secondary | ICD-10-CM

## 2013-12-21 DIAGNOSIS — I6350 Cerebral infarction due to unspecified occlusion or stenosis of unspecified cerebral artery: Secondary | ICD-10-CM

## 2013-12-21 DIAGNOSIS — F329 Major depressive disorder, single episode, unspecified: Secondary | ICD-10-CM

## 2013-12-21 DIAGNOSIS — F3289 Other specified depressive episodes: Secondary | ICD-10-CM

## 2013-12-21 DIAGNOSIS — J438 Other emphysema: Secondary | ICD-10-CM

## 2013-12-21 DIAGNOSIS — E43 Unspecified severe protein-calorie malnutrition: Secondary | ICD-10-CM | POA: Insufficient documentation

## 2013-12-21 LAB — BASIC METABOLIC PANEL
BUN: 49 mg/dL — ABNORMAL HIGH (ref 6–23)
CO2: 17 mEq/L — ABNORMAL LOW (ref 19–32)
Calcium: 9.1 mg/dL (ref 8.4–10.5)
Chloride: 110 mEq/L (ref 96–112)
Creatinine, Ser: 3.7 mg/dL — ABNORMAL HIGH (ref 0.50–1.35)
GFR calc Af Amer: 17 mL/min — ABNORMAL LOW (ref >90)
GFR, EST NON AFRICAN AMERICAN: 15 mL/min — AB (ref >90)
Glucose, Bld: 82 mg/dL (ref 70–99)
POTASSIUM: 3.4 meq/L — AB (ref 3.7–5.3)
SODIUM: 142 meq/L (ref 137–147)

## 2013-12-21 LAB — CBC
HCT: 35.4 % — ABNORMAL LOW (ref 39.0–52.0)
Hemoglobin: 11.9 g/dL — ABNORMAL LOW (ref 13.0–17.0)
MCH: 28.5 pg (ref 26.0–34.0)
MCHC: 33.6 g/dL (ref 30.0–36.0)
MCV: 84.9 fL (ref 78.0–100.0)
Platelets: 332 10*3/uL (ref 150–400)
RBC: 4.17 MIL/uL — ABNORMAL LOW (ref 4.22–5.81)
RDW: 13.7 % (ref 11.5–15.5)
WBC: 9.1 10*3/uL (ref 4.0–10.5)

## 2013-12-21 LAB — MRSA PCR SCREENING: MRSA by PCR: NEGATIVE

## 2013-12-21 MED ORDER — POTASSIUM CHLORIDE CRYS ER 20 MEQ PO TBCR
40.0000 meq | EXTENDED_RELEASE_TABLET | Freq: Once | ORAL | Status: AC
  Administered 2013-12-21: 40 meq via ORAL
  Filled 2013-12-21: qty 2

## 2013-12-21 MED ORDER — SENNOSIDES-DOCUSATE SODIUM 8.6-50 MG PO TABS
1.0000 | ORAL_TABLET | Freq: Once | ORAL | Status: AC
  Administered 2013-12-21: 1 via ORAL
  Filled 2013-12-21: qty 1

## 2013-12-21 MED ORDER — METOPROLOL TARTRATE 25 MG PO TABS
25.0000 mg | ORAL_TABLET | Freq: Two times a day (BID) | ORAL | Status: DC
  Administered 2013-12-21 – 2013-12-22 (×2): 25 mg via ORAL
  Filled 2013-12-21 (×3): qty 1

## 2013-12-21 MED ORDER — DOCUSATE SODIUM 100 MG PO CAPS
200.0000 mg | ORAL_CAPSULE | Freq: Two times a day (BID) | ORAL | Status: DC
  Administered 2013-12-21 – 2013-12-22 (×2): 200 mg via ORAL
  Filled 2013-12-21 (×3): qty 2

## 2013-12-21 MED ORDER — ENSURE PUDDING PO PUDG
1.0000 | Freq: Three times a day (TID) | ORAL | Status: DC
  Administered 2013-12-21 – 2013-12-22 (×2): 1 via ORAL

## 2013-12-21 MED ORDER — CLONIDINE HCL 0.2 MG PO TABS
0.2000 mg | ORAL_TABLET | Freq: Three times a day (TID) | ORAL | Status: DC
  Administered 2013-12-21 – 2013-12-22 (×3): 0.2 mg via ORAL
  Filled 2013-12-21 (×5): qty 1

## 2013-12-21 NOTE — ED Provider Notes (Signed)
I saw and evaluated the patient, reviewed the resident's note and I agree with the findings and plan.   EKG Interpretation None      Pt with gen weakness, nv, decreased energy, decreased responsiveness. Pt alert, speaks v softly, but speech fluent. Denies pain. Nausea improved. Chest cta. abd soft nt. Labs. Ivf.   Mirna Mires, MD 12/21/13 [phone]

## 2013-12-21 NOTE — Consult Note (Signed)
[facility] Renal Consultation Note  Requesting MD: Elmahi Indication for Consultation: A on CKD  HPI:  Kevin Escobar is a 73 y.o. 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 Mercy Moore at [facility].  He recently has been placed at [facility] due to weakness and inability of his wife to be able to care for him.  On 4/23 he was noted to receive botox for muscle spasms- he went to ER on 5/5 with urinary retention at which time creatinine was 3.5 where it had been in the high 2's.  He presented again to the ER last night with weakness, nausea and also the fact that the "could not get his blood pressure down" and creatinine was noted to be 4 and he was pretty hypertensive.  Renal ultrasound did not show obstruction.  With conservative treatment his creatinine this AM was 3.7.  We are asked to see for this A on CKD.  Wife reports an overall failure to thrive over the last couple of months now is the reason for placement at [facility]. She also seems to be somewhat focused on the numbers regarding his kidney function. They have been following with Dr. Mercy Moore in last saw him in March . He was doing well at that time. She states that they have been really talked about the future which may or may not include dialysis for Kevin Escobar. Today, Kevin Escobar states that he does feel better than yesterday. His creatinine has gone from 4.0 4 to 3.7.  Creat  Date/Time Value Ref Range Status  09/30/2012  4:17 PM 2.57* 0.50 - 1.35 mg/dL Final     Creatinine, Ser  Date/Time Value Ref Range Status  12/21/2013  7:18 AM 3.70* 0.50 - 1.35 mg/dL Final  12/20/2013  2:10 PM 4.04* 0.50 - 1.35 mg/dL Final  12/07/2013  5:48 AM 3.80* 0.50 - 1.35 mg/dL Final  12/07/2013  5:40 AM 3.57* 0.50 - 1.35 mg/dL Final  05/31/2013 11:20 AM 2.8* 0.4 - 1.5 mg/dL Final  04/21/2013   3:11 PM 2.80* 0.50 - 1.35 mg/dL Final  03/23/2013  6:00 AM 2.73* 0.50 - 1.35 mg/dL Final  03/11/2013  5:45 AM 2.64* 0.50 - 1.35 mg/dL Final  03/05/2013  6:05 AM 2.41* 0.50 - 1.35 mg/dL Final  03/01/2013  5:15 AM 2.58* 0.50 - 1.35 mg/dL Final  02/24/2013  5:25 AM 2.90* 0.50 - 1.35 mg/dL Final  02/23/2013  5:55 AM 3.00* 0.50 - 1.35 mg/dL Final  02/22/2013  5:40 AM 2.71* 0.50 - 1.35 mg/dL Final  02/21/2013  8:29 AM 2.88* 0.50 - 1.35 mg/dL Final  02/20/2013  4:54 PM 2.70* 0.50 - 1.35 mg/dL Final  02/20/2013  4:31 PM 2.96* 0.50 - 1.35 mg/dL Final  02/10/2013  1:58 PM 3.00* 0.50 - 1.35 mg/dL Final  08/27/2012  6:44 AM 2.77* 0.50 - 1.35 mg/dL Final  02/24/2008  2:59 PM 1.48   Final     PMHx:   Past Medical History  Diagnosis Date  . Cancer of bladder 2010    Dr. Lawerance Bach  . Cancer of prostate 1994    Dr. Lawerance Bach  . Depression   . CKD (chronic kidney disease) stage 4, GFR 15-29 ml/min     solitary right kidney, renal vascular disease ([place])  . Hypertension   . TIA (transient ischemic attack) 2008  . PVD (peripheral vascular  disease) 02/21/2013  . CVA (cerebral infarction) 02/2013    residual R hemiparesis  . AAA (abdominal aortic aneurysm) 1995    s/p repair  . Dyslipidemia     Past Surgical History  Procedure Laterality Date  . Abdominal aortic aneurysm repair  1995  . Prostate surgery  1994  . Colon surgery  2010    fistula repair  . Hernia repair  2005    Family Hx:  Family History  Problem Relation Age of Onset  . Cancer Father     prostate  . Heart failure Mother   . Cancer Brother     prostate  . Diabetes Sister   . CAD Neg Hx   . Stroke Neg Hx     Social History:  reports that he quit smoking about 10 months ago. He has never used smokeless tobacco. He reports that he does not drink alcohol or use illicit drugs.  Allergies:  Allergies  Allergen Reactions  . Other Rash    "Cheerios cause me to break out--bumps all over my chest"  . Plaquenil [Hydroxychloroquine  Sulfate] Rash    Severe rash    Medications: Prior to Admission medications   Medication Sig Start Date End Date Taking? Authorizing Provider  amLODipine (NORVASC) 10 MG tablet Take 10 mg by mouth daily.    Yes Historical Provider, MD  atorvastatin (LIPITOR) 20 MG tablet Take 20 mg by mouth daily. Take one tablet by mouth daily @@ 6 pm.   Yes Historical Provider, MD  cloNIDine (CATAPRES) 0.1 MG tablet Take 0.1 mg by mouth 3 (three) times daily. 03/26/13  Yes Daniel J Angiulli, PA-C  clopidogrel (PLAVIX) 75 MG tablet Take 75 mg by mouth daily with breakfast.   Yes Historical Provider, MD  flavoxATE (URISPAS) 100 MG tablet Take 100 mg by mouth daily.   Yes Historical Provider, MD  metoprolol tartrate (LOPRESSOR) 25 MG tablet Take 12.5 mg by mouth 2 (two) times daily.   Yes Historical Provider, MD  oxyCODONE-acetaminophen (PERCOCET/ROXICET) 5-325 MG per tablet Take 1 tablet by mouth every 6 (six) hours as needed for moderate pain. 12/07/13  Yes Kristen N Ward, DO  sertraline (ZOLOFT) 100 MG tablet Take 100 mg by mouth daily. 09/23/13  Yes Ria Bush, MD  tiZANidine (ZANAFLEX) 4 MG tablet Take 2 mg by mouth every 6 (six) hours as needed for muscle spasms.   Yes Historical Provider, MD  zolpidem (AMBIEN) 5 MG tablet Take 5 mg by mouth at bedtime.   Yes Historical Provider, MD    I have reviewed the patient's current medications.  Labs:  Results for orders placed during the hospital encounter of 12/20/13 (from the past 48 hour(s))  CBC WITH DIFFERENTIAL     Status: Abnormal   Collection Time    12/20/13  2:10 PM      Result Value Ref Range   WBC 10.8 (*) 4.0 - 10.5 K/uL   RBC 4.68  4.22 - 5.81 MIL/uL   Hemoglobin 13.8  13.0 - 17.0 g/dL   HCT 39.4  39.0 - 52.0 %   MCV 84.2  78.0 - 100.0 fL   MCH 29.5  26.0 - 34.0 pg   MCHC 35.0  30.0 - 36.0 g/dL   RDW 13.4  11.5 - 15.5 %   Platelets 395  150 - 400 K/uL   Neutrophils Relative % 79 (*) 43 - 77 %   Neutro Abs 8.5 (*) 1.7 - 7.7 K/uL    Lymphocytes Relative 12  12 - 46 %   Lymphs Abs 1.3  0.7 - 4.0 K/uL   Monocytes Relative 9  3 - 12 %   Monocytes Absolute 1.0  0.1 - 1.0 K/uL   Eosinophils Relative 0  0 - 5 %   Eosinophils Absolute 0.0  0.0 - 0.7 K/uL   Basophils Relative 0  0 - 1 %   Basophils Absolute 0.0  0.0 - 0.1 K/uL  COMPREHENSIVE METABOLIC PANEL     Status: Abnormal   Collection Time    12/20/13  2:10 PM      Result Value Ref Range   Sodium 138  137 - 147 mEq/L   Potassium 3.7  3.7 - 5.3 mEq/L   Chloride 102  96 - 112 mEq/L   CO2 18 (*) 19 - 32 mEq/L   Glucose, Bld 120 (*) 70 - 99 mg/dL   BUN 54 (*) 6 - 23 mg/dL   Creatinine, Ser 4.04 (*) 0.50 - 1.35 mg/dL   Calcium 9.8  8.4 - 10.5 mg/dL   Total Protein 7.5  6.0 - 8.3 g/dL   Albumin 3.4 (*) 3.5 - 5.2 g/dL   AST 8  0 - 37 U/L   ALT 7  0 - 53 U/L   Alkaline Phosphatase 95  39 - 117 U/L   Total Bilirubin 0.3  0.3 - 1.2 mg/dL   GFR calc non Af Amer 13 (*) >90 mL/min   GFR calc Af Amer 15 (*) >90 mL/min   Comment: (NOTE)     The eGFR has been calculated using the CKD EPI equation.     This calculation has not been validated in all clinical situations.     eGFR's persistently <90 mL/min signify possible Chronic Kidney     Disease.  AMMONIA     Status: None   Collection Time    12/20/13  2:10 PM      Result Value Ref Range   Ammonia 15  11 - 60 umol/L  I-STAT CG4 LACTIC ACID, ED     Status: None   Collection Time    12/20/13  2:32 PM      Result Value Ref Range   Lactic Acid, Venous 0.84  0.5 - 2.2 mmol/L  URINALYSIS, ROUTINE W REFLEX MICROSCOPIC     Status: Abnormal   Collection Time    12/20/13  2:33 PM      Result Value Ref Range   Color, Urine YELLOW  YELLOW   APPearance CLEAR  CLEAR   Specific Gravity, Urine 1.024  1.005 - 1.030   pH 5.5  5.0 - 8.0   Glucose, UA NEGATIVE  NEGATIVE mg/dL   Hgb urine dipstick MODERATE (*) NEGATIVE   Bilirubin Urine SMALL (*) NEGATIVE   Ketones, ur NEGATIVE  NEGATIVE mg/dL   Protein, ur >300 (*) NEGATIVE  mg/dL   Urobilinogen, UA 0.2  0.0 - 1.0 mg/dL   Nitrite NEGATIVE  NEGATIVE   Leukocytes, UA NEGATIVE  NEGATIVE  URINE MICROSCOPIC-ADD ON     Status: Abnormal   Collection Time    12/20/13  2:33 PM      Result Value Ref Range   Squamous Epithelial / LPF RARE  RARE   WBC, UA 0-2  <3 WBC/hpf   RBC / HPF 0-2  <3 RBC/hpf   Bacteria, UA FEW (*) RARE   Casts GRANULAR CAST (*) NEGATIVE  TSH     Status: None   Collection Time    12/20/13  4:59 PM  Result Value Ref Range   TSH 0.966  0.350 - 4.500 uIU/mL   Comment: Please note change in reference range.  MRSA PCR SCREENING     Status: None   Collection Time    12/20/13  8:53 PM      Result Value Ref Range   MRSA by PCR NEGATIVE  NEGATIVE   Comment:            The GeneXpert MRSA Assay (FDA     approved for NASAL specimens     only), is one component of a     comprehensive MRSA colonization     surveillance program. It is not     intended to diagnose MRSA     infection nor to guide or     monitor treatment for     MRSA infections.  CBC     Status: Abnormal   Collection Time    12/21/13  7:18 AM      Result Value Ref Range   WBC 9.1  4.0 - 10.5 K/uL   RBC 4.17 (*) 4.22 - 5.81 MIL/uL   Hemoglobin 11.9 (*) 13.0 - 17.0 g/dL   HCT 35.4 (*) 39.0 - 52.0 %   MCV 84.9  78.0 - 100.0 fL   MCH 28.5  26.0 - 34.0 pg   MCHC 33.6  30.0 - 36.0 g/dL   RDW 13.7  11.5 - 15.5 %   Platelets 332  150 - 400 K/uL  BASIC METABOLIC PANEL     Status: Abnormal   Collection Time    12/21/13  7:18 AM      Result Value Ref Range   Sodium 142  137 - 147 mEq/L   Potassium 3.4 (*) 3.7 - 5.3 mEq/L   Chloride 110  96 - 112 mEq/L   CO2 17 (*) 19 - 32 mEq/L   Glucose, Bld 82  70 - 99 mg/dL   BUN 49 (*) 6 - 23 mg/dL   Creatinine, Ser 3.70 (*) 0.50 - 1.35 mg/dL   Calcium 9.1  8.4 - 10.5 mg/dL   GFR calc non Af Amer 15 (*) >90 mL/min   GFR calc Af Amer 17 (*) >90 mL/min   Comment: (NOTE)     The eGFR has been calculated using the CKD EPI equation.      This calculation has not been validated in all clinical situations.     eGFR's persistently <90 mL/min signify possible Chronic Kidney     Disease.     ROS:  A comprehensive review of systems was negative except for: Gastrointestinal: positive for vomiting Neurological: positive for weakness  Physical Exam: Filed Vitals:   12/21/13 1045  BP: 173/80  Pulse:   Temp:   Resp:      General: Thin white male who is very soft spoken. His wife did most of the talking. There is also another family member in the room who seemed pretty knowledgeable and helped to focus the wife of the issues HEENT: Pupils are equal round reactive to light. Extraocular motions are intact mucous members are moist Neck: There is no jugular venous distention, carotid bruits or lymphadenopathy Heart: Regular rate and rhythm Lungs: Mostly clear. Abdomen: Soft, nontender, nondistended Extremities: Really no edema Skin: Warm and dry Neuro: Alert. Indicates that he understand what I'm saying. It is very soft spoken and gives one word answers.  Assessment/Plan: 73 year old white male with stage IV CK D. at baseline. He's experienced somewhat of a failure to thrive over  the last month and has been placed at [facility]. He was admitted with nausea vomiting and persistently high blood pressure. The nausea and vomiting seem to be better today. He had a transient increase in his creatinine which is to be trending better today although I cannot be certain that Kevin Escobar is not uremic. 1.Renal- there are no acute indications for dialysis. GFR by blood work today is 15 mils per minute. I attempted to explain to the patient and the wife where we are. I wanted to start discussion regarding his future which may or may not include dialysis. Because he is residing in a nursing facility he would need to do hemodialysis. I did not painting rosy picture of hemodialysis. I also explained that sometimes people made the decision to not  pursue dialysis therapy so that they can maintain their quality of life. Patient and wife seem to understand what I was saying. Any time to think about what they want his future to hold. I told them that we would not start dialysis unless we were pretty sure that it would improve his quality of life. I'm not sure if his recent failure to thrive has to do with uremia or something different. The good news is that he's feeling better today. 2. Hypertension/volume  - blood pressure which seems malignant at this time. I don't think that he is dry swim stopping his IV fluids. He is on a regimen of Norvasc 10 mg, clonidine 0.1 3 times a day and Lopressor 12 and half twice a day. It increased the dosing of both his clonidine and Lopressor. 3. Anemia  - this does not appear to be an issue for the patient at this time. 4.Dispo- so if tomorrow he feels clinically well and his creatinine is unchanged I think he could be discharged back to [facility] to followup with Dr. Mercy Moore. If tomorrow he feels poorly and or his numbers increased we may need to push the decision regarding dialysis. Otherwise, wife and patient will talk it over so that they can discuss with Dr. Mercy Moore for a followup appointment in June.  Thank you for this consult. I will continue to follow with you  Louis Meckel 12/21/2013, 12:18 PM

## 2013-12-21 NOTE — Clinical Social Work Note (Signed)
Clinical Social Work Department BRIEF PSYCHOSOCIAL ASSESSMENT 12/21/2013  Patient:  Kevin Escobar, Kevin Escobar     Account Number:  [id number]     Admit date:  12/20/2013  Clinical Social Worker:  Lovey Newcomer  Date/Time:  12/21/2013 11:04 AM  Referred by:  Physician  Date Referred:  12/21/2013 Referred for  ALF Placement   Other Referral:   Interview type:  Family Other interview type:   Patient's wife interviewed at bedside to complete assessment.    PSYCHOSOCIAL DATA Living Status:  WIFE Admitted from facility:  Virg[facility] Belling Level of care:  Assisted Living Primary support name:  Kevin Escobar Primary support relationship to patient:  SPOUSE Degree of support available:   Support is strong.    CURRENT CONCERNS Current Concerns  Post-Acute Placement   Other Concerns:    SOCIAL WORK ASSESSMENT / PLAN CSW met with patient and patient's wife at bedside to complete assessment. Patient's wife states that the patient has been at [facility] for the past two weeks as he was having increased difficulty with taking care of himself at home. Wife states that the facility is providing the patient with physical and occupational therapy "5 days a week" and she plans for the patient to return to the ALF at discharge. She states that he ultimate plan is to be able to bring her husband back home if he improves enough. Patient's wife admits that she has been overwhelmed with the level of care her husband required at home, but has been pleased with the ALF.   Assessment/plan status:  Psychosocial Support/Ongoing Assessment of Needs Other assessment/ plan:   Complete FL2, Fax   Information/referral to community resources:   CSW contact information given to wife.    PATIENT'S/FAMILY'S RESPONSE TO PLAN OF CARE: Patient's wife plans for patient to return to [facility] upon discharge. Patient's wife is content with the care the patient receives at this  ALF. Patient's wife was pleasant, appropriate, and appreciaitve of CSW contact. CSW will assist with DC when appropriate.       Kevin Escobar MSW, [place], [place], [phone]

## 2013-12-21 NOTE — Evaluation (Signed)
Physical Therapy Evaluation Patient Details Name: Kevin Escobar MRN: [id number] DOB: 16-Dec-1940 Today's Date: 12/21/2013   History of Present Illness  73 yo male adm 12/20/13 with acute on chronic kidney failure, increasing weakness last 2 weeks at [facility]; PMHx:  CVA with right heniparesis, HTN, depression, AAA with repair  Clinical Impression  Pt will benefit from PT to address deficits below; Will need SNF, wife unable to care for pt at his current status; Wife is realistic about this (was at [facility] prior to this adm for ~2wks)    Follow Up Recommendations SNF;Supervision/Assistance - 24 hour    Equipment Recommendations  None recommended by PT    Recommendations for Other Services       Precautions / Restrictions Precautions Precautions: Fall Precaution Comments: R hemiplegia Restrictions Weight Bearing Restrictions: No      Mobility  Bed Mobility Overal bed mobility: Needs Assistance Bed Mobility: Supine to Sit;Sit to Supine     Supine to sit: Mod assist (with rail) Sit to supine: Max assist   General bed mobility comments: assist for trunk and LEs,incr time and cues to intiate movement; bed pad and tactile cues utilized to facilitate scooting  Transfers                    Ambulation/Gait                Stairs            Wheelchair Mobility    Modified Rankin (Stroke Patients Only)       Balance Overall balance assessment: Needs assistance Sitting-balance support: Feet supported;Single extremity supported Sitting balance-Leahy Scale: Fair Sitting balance - Comments: sat EOB x 9 min, pt reported being tired, mild dizziness intially, did not want to attempt standing                                     Pertinent Vitals/Pain Denies pain, complaints of fatigue    Home Living                   Additional Comments: [facility]    Prior Function           Comments: pt  transferring with wife's assist bed to chair or wheelchair about min to mod assist at baseline--? became weaker about 2 weeks ago and went to [facility] for PT/OT     Hand Dominance   Dominant Hand: Right    Extremity/Trunk Assessment   Upper Extremity Assessment: RUE deficits/detail;Defer to OT evaluation           Lower Extremity Assessment: RLE deficits/detail RLE Deficits / Details: PROM grossly WFL, 0/5 when MMT; able to initiate some RLE movement when getting to EOB       Communication   Communication: Expressive difficulties (??, pt does not verbalize much during PT session, nods)  Cognition Arousal/Alertness: Awake/alert Behavior During Therapy: WFL for tasks assessed/performed Overall Cognitive Status: Difficult to assess (verbalizes very little, follows commands appropriately)                      General Comments      Exercises        Assessment/Plan    PT Assessment Patient needs continued PT services  PT Diagnosis Hemiplegia dominant side;Generalized weakness   PT Problem List Decreased strength;Decreased range of motion;Decreased activity tolerance;Decreased balance;Decreased mobility  PT  Treatment Interventions DME instruction;Gait training;Functional mobility training;Therapeutic activities;Therapeutic exercise;Patient/family education;Balance training   PT Goals (Current goals can be found in the Care Plan section) Acute Rehab PT Goals Patient Stated Goal: per wife--pt nods agreement--would like to be able to transfer bed to chair with min assist or less PT Goal Formulation: With patient/family Time For Goal Achievement: 12/30/13 Potential to Achieve Goals: Good    Frequency Min 3X/week   Barriers to discharge        Co-evaluation               End of Session   Activity Tolerance: Patient limited by fatigue Patient left: in bed;with call bell/phone within reach;with family/visitor present;with bed alarm set      Functional  Assessment Tool Used: clinical judgement Functional Limitation: Changing and maintaining body position Changing and Maintaining Body Position Current Status (A1287): At least 60 percent but less than 80 percent impaired, limited or restricted Changing and Maintaining Body Position Goal Status (O6767): At least 40 percent but less than 60 percent impaired, limited or restricted    Time: 1121-1154 PT Time Calculation (min): 33 min   Charges:   PT Evaluation $Initial PT Evaluation Tier I: 1 Procedure PT Treatments $Therapeutic Activity: 23-37 mins   PT G Codes:   Functional Assessment Tool Used: clinical judgement Functional Limitation: Changing and maintaining body position    Neil Crouch 12/21/2013, 1:29 PM

## 2013-12-21 NOTE — Progress Notes (Signed)
NURSING PROGRESS NOTE  ORLA JOLLIFF [id number] Admission Data: 12/21/2013 2:57 AM Attending Provider: Geradine Girt, DO JME:QASTMH Danise Mina, MD Code Status: full code   Kevin Escobar is a 73 y.o. male patient admitted from ED:  -No acute distress noted.  -No complaints of shortness of breath.  -No complaints of chest pain.   Cardiac Monitoring: Box # N/A in place. Cardiac monitor yields:N/A  Blood pressure 176/94, pulse 88, temperature 97.7 F (36.5 C), temperature source Oral, resp. rate 22, height 6' (1.829 m), weight 61.1 kg (134 lb 11.2 oz), SpO2 94.00%.   IV Fluids:  IV in place, occlusive dsg intact without redness, IV cath antecubital left, condition patent and no redness normal saline.   Allergies:  Other and Plaquenil  Past Medical History:   has a past medical history of Cancer of bladder (2010); Cancer of prostate (1994); Depression; CKD (chronic kidney disease) stage 4, GFR 15-29 ml/min; Hypertension; TIA (transient ischemic attack) (2008); PVD (peripheral vascular disease) (02/21/2013); CVA (cerebral infarction) (02/2013); AAA (abdominal aortic aneurysm) (1995); and Dyslipidemia.  Past Surgical History:   has past surgical history that includes Abdominal aortic aneurysm repair (1995); Prostate surgery (1994); Colon surgery (2010); and Hernia repair (2005).  Social History:   reports that he quit smoking about 10 months ago. He has never used smokeless tobacco. He reports that he does not drink alcohol or use illicit drugs.  Skin: NSI  Patient/Family orientated to room. Information packet given to patient/family. Admission inpatient armband information verified with patient/family to include name and date of birth and placed on patient arm. Side rails up x 2, fall assessment and education completed with patient/family. Patient/family able to verbalize understanding of risk associated with falls and verbalized understanding to call for assistance before getting out  of bed. Call light within reach. Patient/family able to voice and demonstrate understanding of unit orientation instructions.    Will continue to evaluate and treat per MD orders.

## 2013-12-21 NOTE — Telephone Encounter (Signed)
Message left notifying patient's wife.

## 2013-12-21 NOTE — Progress Notes (Signed)
[facility] PROGRESS NOTE   Kevin Escobar UUV:[id number] DOB: 20-Jul-1941 DOA: 12/20/2013 PCP: Ria Bush, MD  HPI/Subjective: Seen with wife at bedside. Progressive weakness over the past several weeks  Assessment/Plan: Active Problems:   Hypertension   CVA (cerebral vascular accident)   Spastic hemiplegia affecting dominant side   Acute on chronic renal failure   Proteinuria   Protein-calorie malnutrition, severe    Acute on chronic renal failure -Presented with creatinine of 4.0, baseline creatinine from October of 2014 was 2.8. -Renal ultrasound showed no evidence of acute abnormalities. -Nephrology consulted, started on IV fluid for gentle hydration. -Check BMP in a.m., already improved old blood since yesterday creatinine today 3.7.  Generalized weakness -As mentioned above progressive weakness over the past several weeks. -Likely secondary to ARF, dehydration. -PT/OT to evaluate and treat.  Hypertension -Uncontrolled blood pressure, restart metoprolol at 25 mg twice a day. -Continue clonidine and amlodipine. -Might need better control her pressure to control acute failure.  CVA with resultant right hemiplegia -Patient is on Plavix, continued. Needs better control for blood pressure  Code Status: Full code Family Communication: Plan discussed with the patient. Disposition Plan: Remains inpatient   Consultants:  Nephrology  Procedures:  None  Antibiotics:   none   Objective: Filed Vitals:   12/21/13 1045  BP: 173/80  Pulse:   Temp:   Resp:     Intake/Output Summary (Last 24 hours) at 12/21/13 1255 Last data filed at 12/21/13 0643  Gross per 24 hour  Intake 943.34 ml  Output    200 ml  Net 743.34 ml   Filed Weights   12/20/13 2050  Weight: 61.1 kg (134 lb 11.2 oz)    Exam: General: Alert and awake, oriented x3, not in any acute distress. HEENT: anicteric sclera, pupils reactive to light and accommodation, EOMI CVS:  S1-S2 clear, no murmur rubs or gallops Chest: clear to auscultation bilaterally, no wheezing, rales or rhonchi Abdomen: soft nontender, nondistended, normal bowel sounds, no organomegaly Extremities: no cyanosis, clubbing or edema noted bilaterally Neuro: Cranial nerves II-XII intact, no focal neurological deficits  Data Reviewed: Basic Metabolic Panel:  Recent Labs Lab 12/20/13 1410 12/21/13 0718  NA 138 142  K 3.7 3.4*  CL 102 110  CO2 18* 17*  GLUCOSE 120* 82  BUN 54* 49*  CREATININE 4.04* 3.70*  CALCIUM 9.8 9.1   Liver Function Tests:  Recent Labs Lab 12/20/13 1410  AST 8  ALT 7  ALKPHOS 95  BILITOT 0.3  PROT 7.5  ALBUMIN 3.4*   No results found for this basename: LIPASE, AMYLASE,  in the last 168 hours  Recent Labs Lab 12/20/13 1410  AMMONIA 15   CBC:  Recent Labs Lab 12/20/13 1410 12/21/13 0718  WBC 10.8* 9.1  NEUTROABS 8.5*  --   HGB 13.8 11.9*  HCT 39.4 35.4*  MCV 84.2 84.9  PLT 395 332   Cardiac Enzymes: No results found for this basename: CKTOTAL, CKMB, CKMBINDEX, TROPONINI,  in the last 168 hours BNP (last 3 results) No results found for this basename: PROBNP,  in the last 8760 hours CBG: No results found for this basename: GLUCAP,  in the last 168 hours  Micro Recent Results (from the past 240 hour(s))  MRSA PCR SCREENING     Status: None   Collection Time    12/20/13  8:53 PM      Result Value Ref Range Status   MRSA by PCR NEGATIVE  NEGATIVE Final   Comment:  The GeneXpert MRSA Assay (FDA     approved for NASAL specimens     only), is one component of a     comprehensive MRSA colonization     surveillance program. It is not     intended to diagnose MRSA     infection nor to guide or     monitor treatment for     MRSA infections.     Studies: US Renal  12/21/2013   CLINICAL DATA:  Renal insufficiency  EXAM: RETROPERITONEAL ULTRASOUND  COMPARISON:  None.  FINDINGS: Right kidney measures 11.5 cm in length. Left  kidney measures 8.2 cm in length. Both kidneys are echogenic with renal cortical thinning, more notable on the left than on the right.  There is a 1.7 x 1.5 x 1.3 cm simple cyst arising from the upper pole the left kidney. No other renal masses are identified on either side. There is no pelvicaliectasis or perinephric fluid collection on either side. There is no sonographically demonstrable calculus or ureterectasis on either side.  No lesion is seen by ultrasound in the region of the urinary bladder.  IMPRESSION: Both kidneys show renal cortical thinning and increased echogenicity, findings associated with medical renal disease.  Small cyst upper pole left kidney. No other renal mass is appreciable.  The left kidney is significantly smaller within the right kidney. This finding could indicate renal artery stenosis on the left. In this regard, question whether the patient is hypertensive.   Electronically Signed   By: Lowella Grip M.D.   On: 12/21/2013 10:51    Scheduled Meds: . amLODipine  10 mg Oral Daily  . atorvastatin  20 mg Oral q1800  . cloNIDine  0.1 mg Oral TID  . clopidogrel  75 mg Oral Q breakfast  . feeding supplement (ENSURE)  1 Container Oral TID BM  . flavoxATE  100 mg Oral Daily  . heparin  5,000 Units Subcutaneous 3 times per day  . metoprolol tartrate  12.5 mg Oral BID  . sertraline  100 mg Oral Daily   Continuous Infusions: . sodium chloride 100 mL/hr at 12/21/13 0704       Time spent: 35 minutes    Verlee Monte  [facility] Pager [phone] If 7PM-7AM, please contact night-coverage at [email or web address], password [facility] 12/21/2013, 12:55 PM  LOS: 1 day

## 2013-12-21 NOTE — Progress Notes (Signed)
UR completed 

## 2013-12-21 NOTE — Progress Notes (Signed)
INITIAL NUTRITION ASSESSMENT  DOCUMENTATION CODES Per approved criteria  -Severe malnutrition in the context of chronic illness   INTERVENTION: Add Ensure Pudding po TID, each supplement provides 170 kcal and 4 grams of protein. Agree with SLP consult, family reports pt was on Regular diet with thin liquids PTA. If thin liquids allowable, add Resource Breeze po daily, each supplement provides 250 kcal and 9 grams of protein. RD to continue to follow nutrition care plan.  NUTRITION DIAGNOSIS: Inadequate oral intake related to poor appetite as evidenced by dietary recall and ongoing weight loss.   Goal: Intake to meet >90% of estimated nutrition needs.  Monitor:  weight trends, lab trends, I/O's, PO intake, supplement tolerance  Reason for Assessment: Malnutrition Screening Tool  73 y.o. male  Admitting Dx: AKI  ASSESSMENT: PMHx significant for bladder and prostate CA, depression, CKD IV, HTN, TIA, CVA. Admitted with worsening weakness, n/v and HTN. Work-up reveals AKI on CKD.  Pt with 11% wt loss x <1 month. Currently ordered for Dysphagia 1 diet with Nectar Thickened liquids. Per wife and patient's brother at bedside, pt has had very poor appetite since May 4th. On May 5th, pt moved to [facility] and began eating poorly. Per their report, pt received a regular consistency diet there. Pt has a hx of CVA and has been on thickened liquids in the past, and does not like them. Was able to eat 75% of his Magic Cup this morning. Family reports that pt doesn't like liquid Ensure, they prefer Boost products.  Nutrition Focused Physical Exam:  Subcutaneous Fat:  Orbital Region: WNL Upper Arm Region: severe depletion Thoracic and Lumbar Region: moderate depletion  Muscle:  Temple Region: moderate depletion Clavicle Bone Region: mild to moderate depletion Clavicle and Acromion Bone Region: mild to moderate depletion Scapular Bone Region: n/a Dorsal Hand: n/a Patellar Region:  moderate depletion Anterior Thigh Region: severe depletion Posterior Calf Region: severe depletion  Edema: none  Pt meets criteria for severe MALNUTRITION in the context of chronic illness as evidenced by severe muscle and fat mass loss, 11% wt loss x 2 months and intake of <75% x at least 1 month.  Sodium low at 3.4  Height: Ht Readings from Last 1 Encounters:  12/20/13 6' (1.829 m)    Weight: Wt Readings from Last 1 Encounters:  12/20/13 134 lb 11.2 oz (61.1 kg)    Ideal Body Weight: 178 lb  % Ideal Body Weight: 75%  Wt Readings from Last 10 Encounters:  12/20/13 134 lb 11.2 oz (61.1 kg)  11/25/13 150 lb (68.04 kg)  10/12/13 150 lb (68.04 kg)  09/23/13 148 lb 4 oz (67.246 kg)  05/31/13 142 lb (64.411 kg)  05/20/13 138 lb (62.596 kg)  05/14/13 150 lb (68.04 kg)  05/04/13 145 lb (65.772 kg)  04/21/13 145 lb (65.772 kg)  04/16/13 150 lb (68.04 kg)    Usual Body Weight: 150 lb  % Usual Body Weight: 89%  BMI:  Body mass index is 18.26 kg/(m^2). Underweight  Estimated Nutritional Needs: Kcal: 1550 - 1700 Protein: 50 - 60 g Fluid: at least 1.5 liters daily  Skin: intact  Diet Order: Dysphagia 1; Nectar Thickened Liquids  EDUCATION NEEDS: -No education needs identified at this time   Intake/Output Summary (Last 24 hours) at 12/21/13 0847 Last data filed at 12/21/13 0643  Gross per 24 hour  Intake 943.34 ml  Output    200 ml  Net 743.34 ml    Last BM: PTA  Labs:  Recent Labs Lab 12/20/13 1410 12/21/13 0718  NA 138 142  K 3.7 3.4*  CL 102 110  CO2 18* 17*  BUN 54* 49*  CREATININE 4.04* 3.70*  CALCIUM 9.8 9.1  GLUCOSE 120* 82    CBG (last 3)  No results found for this basename: GLUCAP,  in the last 72 hours  Scheduled Meds: . amLODipine  10 mg Oral Daily  . atorvastatin  20 mg Oral q1800  . cloNIDine  0.1 mg Oral TID  . clopidogrel  75 mg Oral Q breakfast  . flavoxATE  100 mg Oral Daily  . heparin  5,000 Units Subcutaneous 3 times  per day  . metoprolol tartrate  12.5 mg Oral BID  . sertraline  100 mg Oral Daily    Continuous Infusions: . sodium chloride 100 mL/hr at 12/21/13 6767    Past Medical History  Diagnosis Date  . Cancer of bladder 2010    Dr. Lawerance Bach  . Cancer of prostate 1994    Dr. Lawerance Bach  . Depression   . CKD (chronic kidney disease) stage 4, GFR 15-29 ml/min     solitary right kidney, renal vascular disease ([place])  . Hypertension   . TIA (transient ischemic attack) 2008  . PVD (peripheral vascular disease) 02/21/2013  . CVA (cerebral infarction) 02/2013    residual R hemiparesis  . AAA (abdominal aortic aneurysm) 1995    s/p repair  . Dyslipidemia     Past Surgical History  Procedure Laterality Date  . Abdominal aortic aneurysm repair  1995  . Prostate surgery  1994  . Colon surgery  2010    fistula repair  . Hernia repair  2005    Inda Coke MS, RD, LDN Inpatient Registered Dietitian Pager: [phone] After-hours pager: [phone]

## 2013-12-22 ENCOUNTER — Observation Stay (HOSPITAL_COMMUNITY): Payer: No Typology Code available for payment source

## 2013-12-22 LAB — RENAL FUNCTION PANEL
Albumin: 2.8 g/dL — ABNORMAL LOW (ref 3.5–5.2)
BUN: 46 mg/dL — ABNORMAL HIGH (ref 6–23)
CO2: 11 mEq/L — ABNORMAL LOW (ref 19–32)
Calcium: 9.3 mg/dL (ref 8.4–10.5)
Chloride: 113 mEq/L — ABNORMAL HIGH (ref 96–112)
Creatinine, Ser: 3.21 mg/dL — ABNORMAL HIGH (ref 0.50–1.35)
GFR calc non Af Amer: 17 mL/min — ABNORMAL LOW (ref >90)
GFR, EST AFRICAN AMERICAN: 20 mL/min — AB (ref >90)
Glucose, Bld: 101 mg/dL — ABNORMAL HIGH (ref 70–99)
PHOSPHORUS: 2.2 mg/dL — AB (ref 2.3–4.6)
POTASSIUM: 5.5 meq/L — AB (ref 3.7–5.3)
SODIUM: 139 meq/L (ref 137–147)

## 2013-12-22 MED ORDER — BIOTENE DRY MOUTH MT LIQD
15.0000 mL | Freq: Two times a day (BID) | OROMUCOSAL | Status: DC
  Administered 2013-12-22: 15 mL via OROMUCOSAL

## 2013-12-22 MED ORDER — CLONIDINE HCL 0.1 MG PO TABS
0.2000 mg | ORAL_TABLET | Freq: Three times a day (TID) | ORAL | Status: AC
Start: 2013-12-22 — End: ?

## 2013-12-22 MED ORDER — METOPROLOL TARTRATE 25 MG PO TABS: 25.0000 mg | ORAL_TABLET | Freq: Two times a day (BID) | ORAL | Status: AC

## 2013-12-22 MED ORDER — OXYCODONE-ACETAMINOPHEN 5-325 MG PO TABS: 1.0000 | ORAL_TABLET | Freq: Four times a day (QID) | ORAL | Status: DC | PRN

## 2013-12-22 MED ORDER — ENSURE COMPLETE PO LIQD: 237.0000 mL | Freq: Three times a day (TID) | ORAL | Status: AC

## 2013-12-22 MED ORDER — ENSURE PUDDING PO PUDG: 1.0000 | Freq: Three times a day (TID) | ORAL | Status: DC

## 2013-12-22 MED ORDER — SODIUM POLYSTYRENE SULFONATE 15 GM/60ML PO SUSP
30.0000 g | Freq: Once | ORAL | Status: AC
  Administered 2013-12-22: 30 g via ORAL
  Filled 2013-12-22: qty 120

## 2013-12-22 MED ORDER — SODIUM BICARBONATE 650 MG PO TABS
1300.0000 mg | ORAL_TABLET | Freq: Two times a day (BID) | ORAL | Status: DC
  Filled 2013-12-22 (×2): qty 2

## 2013-12-22 MED ORDER — SODIUM BICARBONATE 650 MG PO TABS: 1300.0000 mg | ORAL_TABLET | Freq: Two times a day (BID) | ORAL | Status: AC

## 2013-12-22 NOTE — Progress Notes (Signed)
Pt prepared for d/c to SNF. IV d/c'd. Skin intact except as most recently charted. Vitals are stable. Report called to receiving facility. Pt to be transported by ambulance service.Etheline Geppert,RN5/20/2015 3:37 PM

## 2013-12-22 NOTE — Clinical Social Work Note (Signed)
Per MD patient ready to return to [facility]. Request information has been faxed to facility and facility has cleared patient to return. RN, patient, patient's wife, and facility aware of DC. RN given number for report. DC packet on chart. Ambulance transport requested for patient per wife's request. CSW signing off at this time.   Liz Beach MSW, [place], [place], [phone]

## 2013-12-22 NOTE — Discharge Summary (Addendum)
PATIENT DETAILS Name: Kevin Escobar Age: 73 y.o. Sex: male Date of Birth: [birth year] MRN: [id number]. Admit Date: 12/20/2013 Admitting Physician: Geradine Girt, DO PZW:CHENID Danise Mina, MD  Recommendations for Outpatient Follow-up:  1. Please repeat basic metabolic panel in 3 days 2. Consider palliative care followup for evaluation while at assisted living facility 3. Consider physical therapy, occupational therapy and speech therapy followup was at assisted living facility.  PRIMARY DISCHARGE DIAGNOSIS:  Active Problems:   Hypertension   CVA (cerebral vascular accident)   Spastic hemiplegia affecting dominant side   Acute on chronic renal failure   Proteinuria   Protein-calorie malnutrition, severe      PAST MEDICAL HISTORY: Past Medical History  Diagnosis Date  . Cancer of bladder 2010    Dr. Lawerance Bach  . Cancer of prostate 1994    Dr. Lawerance Bach  . Depression   . CKD (chronic kidney disease) stage 4, GFR 15-29 ml/min     solitary right kidney, renal vascular disease ([place])  . Hypertension   . TIA (transient ischemic attack) 2008  . PVD (peripheral vascular disease) 02/21/2013  . CVA (cerebral infarction) 02/2013    residual R hemiparesis  . AAA (abdominal aortic aneurysm) 1995    s/p repair  . Dyslipidemia     DISCHARGE MEDICATIONS:   Medication List    STOP taking these medications       zolpidem 5 MG tablet  Commonly known as:  AMBIEN      TAKE these medications       amLODipine 10 MG tablet  Commonly known as:  NORVASC  Take 10 mg by mouth daily.     atorvastatin 20 MG tablet  Commonly known as:  LIPITOR  Take 20 mg by mouth daily. Take one tablet by mouth daily @@ 6 pm.     cloNIDine 0.1 MG tablet  Commonly known as:  CATAPRES  Take 2 tablets (0.2 mg total) by mouth 3 (three) times daily.     clopidogrel 75 MG tablet  Commonly known as:  PLAVIX  Take 75 mg by mouth daily with breakfast.     feeding supplement (ENSURE COMPLETE)  Liqd  Take 237 mLs by mouth 3 (three) times daily with meals.     flavoxATE 100 MG tablet  Commonly known as:  URISPAS  Take 100 mg by mouth daily.     metoprolol tartrate 25 MG tablet  Commonly known as:  LOPRESSOR  Take 1 tablet (25 mg total) by mouth 2 (two) times daily.     oxyCODONE-acetaminophen 5-325 MG per tablet  Commonly known as:  PERCOCET/ROXICET  Take 1 tablet by mouth every 6 (six) hours as needed for moderate pain.     sertraline 100 MG tablet  Commonly known as:  ZOLOFT  Take 100 mg by mouth daily.     sodium bicarbonate 650 MG tablet  Take 2 tablets (1,300 mg total) by mouth 2 (two) times daily.     tiZANidine 4 MG tablet  Commonly known as:  ZANAFLEX  Take 2 mg by mouth every 6 (six) hours as needed for muscle spasms.            ALLERGIES:   Allergies  Allergen Reactions  . Other Rash    "Cheerios cause me to break out--bumps all over my chest"  . Plaquenil [Hydroxychloroquine Sulfate] Rash    Severe rash    BRIEF HPI:  See H&P, Labs, Consult and Test reports for all details in brief,  patient is a 73 year old male with a history of chronic kidney disease stage 3-4, failure to thrive, uncontrolled hypertension who presented to the ED with significant weakness. He was found to have significant worsening of his renal function and elevated blood pressure. He was admitted for further evaluation and treatment.  CONSULTATIONS:   nephrology  PERTINENT RADIOLOGIC STUDIES: Dg Lumbar Spine Complete  12/07/2013   CLINICAL DATA:  Low back pain radiating to right leg.  EXAM: LUMBAR SPINE - COMPLETE 4+ VIEW  COMPARISON:  None.  FINDINGS: Lumbar vertebral bodies are intact with maintenance of lumbar lordosis. Minimal grade 1 L4-5 retrolisthesis, without pars interarticularis defects seen, though evaluation is somewhat limited by endovascular stent graft. Mild-to-moderate L1-2 through L4-5 degenerative disc disease. Mild lower lumbar facet arthropathy. No destructive  bony lesions. Surgical clips in the pelvis.  IMPRESSION: Degenerative change of the lumbar spine without acute fracture. Minimal grade 1 L4-5 retrolisthesis on degenerative basis.   Electronically Signed   By: Elon Alas   On: 12/07/2013 06:11   Mr Lumbar Spine Wo Contrast  12/07/2013   CLINICAL DATA:  Back pain. Incontinence. Lower extremity weakness. History of bladder cancer and chronic renal disease. History of abdominal aortic aneurysm treated with stenting  EXAM: MRI LUMBAR SPINE WITHOUT CONTRAST  TECHNIQUE: Multiplanar, multisequence MR imaging of the lumbar spine was performed. No intravenous contrast was administered.  COMPARISON:  12/07/2013; 12/05/2007  FINDINGS: Metal artifact from the multiple aortoiliac stents obscures a significant portion of the bony lumbar spine, including the majority of the L5 and L2 vertebral bodies.  Type 1 degenerative endplate findings observed at the L3-4 level and to a lesser extent at L2-3 and L4-5. The lowest lumbar type non-rib-bearing vertebra is labeled as L5. The conus medullaris appears normal. Conus level: T12.  Atrophic left kidney noted with several fluid signal intensity lesions probably reflecting cysts. Despite efforts by the technologist and patient, motion artifact is present on today's exam and could not be eliminated. This reduces exam sensitivity and specificity.  No significant lumbar malalignment. Additional findings at individual levels are as follows:  L1-2: No impingement. Small left inferior foraminal disc protrusion.  L2-3: No impingement. Bilateral facet arthropathy and mild intervertebral spurring along with minimal disc bulge.  L3-4: Mild right and borderline left foraminal stenosis due to facet and intervertebral spurring. Mild disc bulge.  L4-5: Mild bilateral foraminal stenosis due to facet and intervertebral spurring. Mild disc bulge. Borderline left subarticular lateral recess stenosis.  L5-S1: No right-sided impingement noted. Left  lateral recess and left neural foramen completely obscured by metal artifact.  IMPRESSION: 1. No specific process identified to account for the patient's acute symptoms. There is mild impingement at L3-4 and L4-5 which appears chronic. 2. Significant portions of the vertebral body column, especially eccentric to the left at L5-S1, are obscured by metal artifact from the patient's aortoiliac stents. Motion artifact is also present on multiple series despite best efforts by the patient and technologist. 3. Multilevel motor type 1 degenerative endplate findings, particularly at L3-4.   Electronically Signed   By: Sherryl Barters M.D.   On: 12/07/2013 09:07   US Renal  12/21/2013   CLINICAL DATA:  Renal insufficiency  EXAM: RETROPERITONEAL ULTRASOUND  COMPARISON:  None.  FINDINGS: Right kidney measures 11.5 cm in length. Left kidney measures 8.2 cm in length. Both kidneys are echogenic with renal cortical thinning, more notable on the left than on the right.  There is a 1.7 x 1.5 x 1.3 cm  simple cyst arising from the upper pole the left kidney. No other renal masses are identified on either side. There is no pelvicaliectasis or perinephric fluid collection on either side. There is no sonographically demonstrable calculus or ureterectasis on either side.  No lesion is seen by ultrasound in the region of the urinary bladder.  IMPRESSION: Both kidneys show renal cortical thinning and increased echogenicity, findings associated with medical renal disease.  Small cyst upper pole left kidney. No other renal mass is appreciable.  The left kidney is significantly smaller within the right kidney. This finding could indicate renal artery stenosis on the left. In this regard, question whether the patient is hypertensive.   Electronically Signed   By: Lowella Grip M.D.   On: 12/21/2013 10:51     PERTINENT LAB RESULTS: CBC:  Recent Labs  12/20/13 1410 12/21/13 0718  WBC 10.8* 9.1  HGB 13.8 11.9*  HCT 39.4 35.4*   PLT 395 332   CMET CMP     Component Value Date/Time   NA 139 12/22/2013 0945   K 5.5* 12/22/2013 0945   CL 113* 12/22/2013 0945   CO2 11* 12/22/2013 0945   GLUCOSE 101* 12/22/2013 0945   BUN 46* 12/22/2013 0945   CREATININE 3.21* 12/22/2013 0945   CREATININE 2.57* 09/30/2012 1617   CALCIUM 9.3 12/22/2013 0945   PROT 7.5 12/20/2013 1410   ALBUMIN 2.8* 12/22/2013 0945   AST 8 12/20/2013 1410   ALT 7 12/20/2013 1410   ALKPHOS 95 12/20/2013 1410   BILITOT 0.3 12/20/2013 1410   GFRNONAA 17* 12/22/2013 0945   GFRNONAA 24* 09/30/2012 1617   GFRAA 20* 12/22/2013 0945   GFRAA 27* 09/30/2012 1617    GFR Estimated Creatinine Clearance: 17.2 ml/min (by C-G formula based on Cr of 3.21). No results found for this basename: LIPASE, AMYLASE,  in the last 72 hours No results found for this basename: CKTOTAL, CKMB, CKMBINDEX, TROPONINI,  in the last 72 hours No components found with this basename: POCBNP,  No results found for this basename: DDIMER,  in the last 72 hours No results found for this basename: HGBA1C,  in the last 72 hours No results found for this basename: CHOL, HDL, LDLCALC, TRIG, CHOLHDL, LDLDIRECT,  in the last 72 hours  Recent Labs  12/20/13 1659  TSH 0.966   No results found for this basename: VITAMINB12, FOLATE, FERRITIN, TIBC, IRON, RETICCTPCT,  in the last 72 hours Coags: No results found for this basename: PT, INR,  in the last 72 hours Microbiology: Recent Results (from the past 240 hour(s))  MRSA PCR SCREENING     Status: None   Collection Time    12/20/13  8:53 PM      Result Value Ref Range Status   MRSA by PCR NEGATIVE  NEGATIVE Final   Comment:            The GeneXpert MRSA Assay (FDA     approved for NASAL specimens     only), is one component of a     comprehensive MRSA colonization     surveillance program. It is not     intended to diagnose MRSA     infection nor to guide or     monitor treatment for     MRSA infections.     BRIEF HOSPITAL COURSE:    Acute on chronic renal failure stage IV -Presented with creatinine of 4.0, baseline creatinine from October of 2014 was 2.8.  -Renal ultrasound showed no evidence of  acute abnormalities. -Nephrology consulted, patient was admitted with supportive care. Creatinine now down trending, down to 3.2 at the day of discharge. Although has significant metabolic acidosis with a bicarbonate of 11, spoke with Dr. Daisy Lazar on call who suggested that since creatinine was improving, patient was stable for discharge from a point of view as well. Mild hyperkalemia, will be given a dose of Kayexalate prior to discharge. Suggest repeating electrolytes in the next 3 days. Nephrology will need to follow this patient closely in the outpatient setting, Dr. Moshe Cipro will speak with Dr. Mercy Moore and arrange appropriate followup. - Spoke with family and patient, at this time patient is leaning on not starting dialysis. Patient exhibits significant failure to thrive syndrome, per family he has lost a significant amount of weight and has a very poor oral appetite. Family is very aware of poor overall prognosis.  Generalized weakness - Multifactorial, but suspect this is secondary to failure to thrive syndrome, worsening renal failure, dehydration. - Continues to be significantly weak, family prefers that the patient go back to assisted living facility, hopefully, can be followed by physical therapy services while at assisted living facility.  Malignant hypertension - Admitted, restarted on usual antihypertensive medications, further adjustments made. Blood pressure better but still fluctuating and moderately uncontrolled. Feel stable for discharge, further optimization to be done in the outpatient setting.  History of CVA with resultant right hemiplegia - Continue Plavix, continue antihypertensives.  History of dysphagia - Seen by speech therapy services and recommended to be on a dysphagia 1 diet with  nectar thick liquids, speech therapy services also recommended a modified barium swallow. Patient's spouse does not want any further investigations at this point, she and the patient accepts all risks of aspiration, and want to continue a regular diet. Life-threatening effects of aspiration including respiratory failure and pneumonia have been explained to the patient and family. Please have speech therapy follow patient while at assisted living facility.  Failure to thrive syndrome - Patient losing significant amount of weight, very weak at times, and with a poor appetite. Difficult situation, will ask for physical therapy services to continue to follow patient while at his assisted living facility. Continue Ensure on discharge. - Overall prognosis is failure to thrive syndrome continues.  Severe protein calorie malnutrition - Likely secondary to chronic disease, continue with nutrition supplements on discharge.  TODAY-DAY OF DISCHARGE:  Subjective:   Rainey Iwanski today has no headache,no chest abdominal pain,no new weakness tingling or numbness, feels much better wants to go home today.   Objective:   Blood pressure 168/80, pulse 80, temperature 97.4 F (36.3 C), temperature source Oral, resp. rate 20, height 6' (1.829 m), weight 61.1 kg (134 lb 11.2 oz), SpO2 94.00%.  Intake/Output Summary (Last 24 hours) at 12/22/13 1357 Last data filed at 12/22/13 1103  Gross per 24 hour  Intake      0 ml  Output    651 ml  Net   -651 ml   Filed Weights   12/20/13 2050  Weight: 61.1 kg (134 lb 11.2 oz)    Exam Awake Alert, Oriented *3, No new F.N deficits, Normal affect [place].AT,PERRAL Supple Neck,No JVD, No cervical lymphadenopathy appriciated.  Symmetrical Chest wall movement, Good air movement bilaterally, CTAB RRR,No Gallops,Rubs or new Murmurs, No Parasternal Heave +ve B.Sounds, Abd Soft, Non tender, No organomegaly appriciated, No rebound -guarding or rigidity. No Cyanosis, Clubbing  or edema, No new Rash or bruise  DISCHARGE CONDITION: Stable  DISPOSITION: ALF   DISCHARGE  INSTRUCTIONS:    Activity:  As tolerated with Full fall precautions use walker/cane & assistance as needed  Strict aspiration precautions  Diet recommendation: Heart Healthy diet with aspiration precautions  Discharge Instructions   Call MD for:  difficulty breathing, headache or visual disturbances    Complete by:  As directed      Call MD for:  persistant dizziness or light-headedness    Complete by:  As directed      Diet - low sodium heart healthy    Complete by:  As directed      Increase activity slowly    Complete by:  As directed            Follow-up Information   Follow up with Ria Bush, MD. Schedule an appointment as soon as possible for a visit in 1 week. (Appointment with Dr. Danise Mina is on  5/)    Specialty:  Family Medicine   Contact information:   [place] [address] [phone]       Follow up with Windy Kalata, MD. Schedule an appointment as soon as possible for a visit in 1 week.   Specialty:  Nephrology   Contact information:   [place] [address] [phone]       Total Time spent on discharge equals 45 minutes.  Signed: Jonetta Osgood 12/22/2013 1:57 PM  **Disclaimer: This note may have been dictated with voice recognition software. Similar sounding words can inadvertently be transcribed and this note may contain transcription errors which may not have been corrected upon publication of note.**

## 2013-12-22 NOTE — Progress Notes (Signed)
Speech Language Pathology Treatment: Dysphagia  Patient Details Name: Kevin Escobar MRN: [id number] DOB: 01-06-1941 Today's Date: 12/22/2013 Time: 1010-1018 SLP Time Calculation (min): 8 min  Assessment / Plan / Recommendation Clinical Impression  SLP notified of wife's questions re: swallow plan and desire to speak with SLP.  Wife reports pt. was cleared for thin liquids prior to discharge from inpatient rehab last summer and has consumed thin and regular diet since.  SLP explained that admitting MD initiated Dys 1, nectar after observing dysphagia history in chart.  Pt. exhibiting mild s/s aspiration and educated that current swallow function cannot be fully assessed with bedside alone and educated on risks of aspiration and potential outcomes.  Pt.'s wife politely stated she does not want MBS performed, desires regular diet textures and thin liquids for husband, stated he refused the modified diet/liquids last year and understands risks.  SLP understands and agrees with desire for comfort feeds, however he is a full code which may need to be addressed if he returns with respiratory issues.  SLP upgraded diet and liquids and will discharge from ST.   HPI HPI: 73 y.o. male from [facility] with c/o worsening weaknes, N/V and patient was noticed to be hypertensive.  PMH:  CVA7/2014, prostate cancer, CKD, TIA, AAA.  MBS 02/25/13 revealed audible aspiration of thin liquids x 2 during testing with recommendation of Dysphagia 1 (Puree);Nectar-thick liquid (single ice chips.   Pertinent Vitals WDL  SLP Plan  Discharge SLP treatment due to (comment) (pt./wife accepting aspiration risks)    Recommendations Diet recommendations: Regular;Thin liquid Liquids provided via: Straw;Cup Medication Administration: Whole meds with puree Supervision: Patient able to self feed;Intermittent supervision to cue for compensatory strategies Compensations: Slow rate;Small sips/bites Postural Changes and/or  Swallow Maneuvers: Seated upright 90 degrees;Upright 30-60 min after meal              Oral Care Recommendations: Oral care BID Follow up Recommendations: Skilled Nursing facility Plan: Discharge SLP treatment due to (comment) (pt./wife accepting aspiration risks)    GO Functional Assessment Tool Used: clinical judgement Functional Limitations: Swallowing Swallow Current Status ([id number]): At least 40 percent but less than 60 percent impaired, limited or restricted Swallow Goal Status [phone]): At least 40 percent but less than 60 percent impaired, limited or restricted Swallow Discharge Status [phone]): At least 40 percent but less than 60 percent impaired, limited or restricted   Houston Siren M.Ed [employer] [phone]  12/22/2013

## 2013-12-22 NOTE — Care Management Note (Signed)
    Page 1 of 1   12/22/2013     2:55:04 PM CARE MANAGEMENT NOTE 12/22/2013  Patient:  Kevin Escobar, Kevin Escobar   Account Number:  [id number]  Date Initiated:  12/22/2013  Documentation initiated by:  Tomi Bamberger  Subjective/Objective Assessment:   dx ftt, kidney failure  admit as observation- from [facility]     Action/Plan:   Anticipated DC Date:  12/22/2013   Anticipated DC Plan:  ASSISTED LIVING / [place] referral  Clinical Social Worker      DC Planning Services  CM consult      Choice offered to / List presented to:             Status of service:  Completed, signed off Medicare Important Message given?   (If response is "NO", the following Medicare IM given date fields will be blank) Date Medicare IM given:   Date Additional Medicare IM given:    Discharge Disposition:  HOME/SELF CARE  Per UR Regulation:  Reviewed for med. necessity/level of care/duration of stay  If discussed at [place] of Stay Meetings, dates discussed:    Comments:

## 2013-12-22 NOTE — Clinical Social Work Note (Signed)
Clinical documentation faxed to [facility] for review.  Liz Beach MSW, [place], [place], [phone]

## 2013-12-22 NOTE — Progress Notes (Addendum)
PATIENT DETAILS Name: Kevin Escobar Age: 73 y.o. Sex: male Date of Birth: February 28, 1941 MRN: [id number]. Admit Date: 12/20/2013 Admitting Physician: Geradine Girt, DO OQ:[id number] Danise Mina, MD  Recommendations for Outpatient Follow-up:  1. Please repeat basic metabolic panel in 3 days 2. Consider palliative care followup for evaluation while at assisted living facility 3. Consider physical therapy, occupational therapy and speech therapy followup was at assisted living facility.  PRIMARY DISCHARGE DIAGNOSIS:  Active Problems:   Hypertension   CVA (cerebral vascular accident)   Spastic hemiplegia affecting dominant side   Acute on chronic renal failure   Proteinuria   Protein-calorie malnutrition, severe      PAST MEDICAL HISTORY: Past Medical History  Diagnosis Date  . Cancer of bladder 2010    Dr. Lawerance Bach  . Cancer of prostate 1994    Dr. Lawerance Bach  . Depression   . CKD (chronic kidney disease) stage 4, GFR 15-29 ml/min     solitary right kidney, renal vascular disease ([place])  . Hypertension   . TIA (transient ischemic attack) 2008  . PVD (peripheral vascular disease) 02/21/2013  . CVA (cerebral infarction) 02/2013    residual R hemiparesis  . AAA (abdominal aortic aneurysm) 1995    s/p repair  . Dyslipidemia     DISCHARGE MEDICATIONS:   Medication List    STOP taking these medications       zolpidem 5 MG tablet  Commonly known as:  AMBIEN      TAKE these medications       amLODipine 10 MG tablet  Commonly known as:  NORVASC  Take 10 mg by mouth daily.     atorvastatin 20 MG tablet  Commonly known as:  LIPITOR  Take 20 mg by mouth daily. Take one tablet by mouth daily @@ 6 pm.     cloNIDine 0.1 MG tablet  Commonly known as:  CATAPRES  Take 2 tablets (0.2 mg total) by mouth 3 (three) times daily.     clopidogrel 75 MG tablet  Commonly known as:  PLAVIX  Take 75 mg by mouth daily with breakfast.     feeding supplement (ENSURE COMPLETE)  Liqd  Take 237 mLs by mouth 3 (three) times daily with meals.     flavoxATE 100 MG tablet  Commonly known as:  URISPAS  Take 100 mg by mouth daily.     metoprolol tartrate 25 MG tablet  Commonly known as:  LOPRESSOR  Take 1 tablet (25 mg total) by mouth 2 (two) times daily.     oxyCODONE-acetaminophen 5-325 MG per tablet  Commonly known as:  PERCOCET/ROXICET  Take 1 tablet by mouth every 6 (six) hours as needed for moderate pain.     sertraline 100 MG tablet  Commonly known as:  ZOLOFT  Take 100 mg by mouth daily.     sodium bicarbonate 650 MG tablet  Take 2 tablets (1,300 mg total) by mouth 2 (two) times daily.     tiZANidine 4 MG tablet  Commonly known as:  ZANAFLEX  Take 2 mg by mouth every 6 (six) hours as needed for muscle spasms.        ALLERGIES:   Allergies  Allergen Reactions  . Other Rash    "Cheerios cause me to break out--bumps all over my chest"  . Plaquenil [Hydroxychloroquine Sulfate] Rash    Severe rash    BRIEF HPI:  See H&P, Labs, Consult and Test reports for all details in brief, patient is a 73 year old  male with a history of chronic kidney disease stage 3-4, failure to thrive, uncontrolled hypertension who presented to the ED with significant weakness. He was found to have significant worsening of his renal function and elevated blood pressure. He was admitted for further evaluation and treatment.  CONSULTATIONS:   nephrology  PERTINENT RADIOLOGIC STUDIES: Dg Lumbar Spine Complete  12/07/2013   CLINICAL DATA:  Low back pain radiating to right leg.  EXAM: LUMBAR SPINE - COMPLETE 4+ VIEW  COMPARISON:  None.  FINDINGS: Lumbar vertebral bodies are intact with maintenance of lumbar lordosis. Minimal grade 1 L4-5 retrolisthesis, without pars interarticularis defects seen, though evaluation is somewhat limited by endovascular stent graft. Mild-to-moderate L1-2 through L4-5 degenerative disc disease. Mild lower lumbar facet arthropathy. No destructive bony  lesions. Surgical clips in the pelvis.  IMPRESSION: Degenerative change of the lumbar spine without acute fracture. Minimal grade 1 L4-5 retrolisthesis on degenerative basis.   Electronically Signed   By: Elon Alas   On: 12/07/2013 06:11   Mr Lumbar Spine Wo Contrast  12/07/2013   CLINICAL DATA:  Back pain. Incontinence. Lower extremity weakness. History of bladder cancer and chronic renal disease. History of abdominal aortic aneurysm treated with stenting  EXAM: MRI LUMBAR SPINE WITHOUT CONTRAST  TECHNIQUE: Multiplanar, multisequence MR imaging of the lumbar spine was performed. No intravenous contrast was administered.  COMPARISON:  12/07/2013; 12/05/2007  FINDINGS: Metal artifact from the multiple aortoiliac stents obscures a significant portion of the bony lumbar spine, including the majority of the L5 and L2 vertebral bodies.  Type 1 degenerative endplate findings observed at the L3-4 level and to a lesser extent at L2-3 and L4-5. The lowest lumbar type non-rib-bearing vertebra is labeled as L5. The conus medullaris appears normal. Conus level: T12.  Atrophic left kidney noted with several fluid signal intensity lesions probably reflecting cysts. Despite efforts by the technologist and patient, motion artifact is present on today's exam and could not be eliminated. This reduces exam sensitivity and specificity.  No significant lumbar malalignment. Additional findings at individual levels are as follows:  L1-2: No impingement. Small left inferior foraminal disc protrusion.  L2-3: No impingement. Bilateral facet arthropathy and mild intervertebral spurring along with minimal disc bulge.  L3-4: Mild right and borderline left foraminal stenosis due to facet and intervertebral spurring. Mild disc bulge.  L4-5: Mild bilateral foraminal stenosis due to facet and intervertebral spurring. Mild disc bulge. Borderline left subarticular lateral recess stenosis.  L5-S1: No right-sided impingement noted. Left  lateral recess and left neural foramen completely obscured by metal artifact.  IMPRESSION: 1. No specific process identified to account for the patient's acute symptoms. There is mild impingement at L3-4 and L4-5 which appears chronic. 2. Significant portions of the vertebral body column, especially eccentric to the left at L5-S1, are obscured by metal artifact from the patient's aortoiliac stents. Motion artifact is also present on multiple series despite best efforts by the patient and technologist. 3. Multilevel motor type 1 degenerative endplate findings, particularly at L3-4.   Electronically Signed   By: Sherryl Barters M.D.   On: 12/07/2013 09:07   US Renal  12/21/2013   CLINICAL DATA:  Renal insufficiency  EXAM: RETROPERITONEAL ULTRASOUND  COMPARISON:  None.  FINDINGS: Right kidney measures 11.5 cm in length. Left kidney measures 8.2 cm in length. Both kidneys are echogenic with renal cortical thinning, more notable on the left than on the right.  There is a 1.7 x 1.5 x 1.3 cm simple cyst arising from  the upper pole the left kidney. No other renal masses are identified on either side. There is no pelvicaliectasis or perinephric fluid collection on either side. There is no sonographically demonstrable calculus or ureterectasis on either side.  No lesion is seen by ultrasound in the region of the urinary bladder.  IMPRESSION: Both kidneys show renal cortical thinning and increased echogenicity, findings associated with medical renal disease.  Small cyst upper pole left kidney. No other renal mass is appreciable.  The left kidney is significantly smaller within the right kidney. This finding could indicate renal artery stenosis on the left. In this regard, question whether the patient is hypertensive.   Electronically Signed   By: Lowella Grip M.D.   On: 12/21/2013 10:51     PERTINENT LAB RESULTS: CBC:  Recent Labs  12/20/13 1410 12/21/13 0718  WBC 10.8* 9.1  HGB 13.8 11.9*  HCT 39.4 35.4*   PLT 395 332   CMET CMP     Component Value Date/Time   NA 139 12/22/2013 0945   K 5.5* 12/22/2013 0945   CL 113* 12/22/2013 0945   CO2 11* 12/22/2013 0945   GLUCOSE 101* 12/22/2013 0945   BUN 46* 12/22/2013 0945   CREATININE 3.21* 12/22/2013 0945   CREATININE 2.57* 09/30/2012 1617   CALCIUM 9.3 12/22/2013 0945   PROT 7.5 12/20/2013 1410   ALBUMIN 2.8* 12/22/2013 0945   AST 8 12/20/2013 1410   ALT 7 12/20/2013 1410   ALKPHOS 95 12/20/2013 1410   BILITOT 0.3 12/20/2013 1410   GFRNONAA 17* 12/22/2013 0945   GFRNONAA 24* 09/30/2012 1617   GFRAA 20* 12/22/2013 0945   GFRAA 27* 09/30/2012 1617    GFR Estimated Creatinine Clearance: 17.2 ml/min (by C-G formula based on Cr of 3.21). No results found for this basename: LIPASE, AMYLASE,  in the last 72 hours No results found for this basename: CKTOTAL, CKMB, CKMBINDEX, TROPONINI,  in the last 72 hours No components found with this basename: POCBNP,  No results found for this basename: DDIMER,  in the last 72 hours No results found for this basename: HGBA1C,  in the last 72 hours No results found for this basename: CHOL, HDL, LDLCALC, TRIG, CHOLHDL, LDLDIRECT,  in the last 72 hours  Recent Labs  12/20/13 1659  TSH 0.966   No results found for this basename: VITAMINB12, FOLATE, FERRITIN, TIBC, IRON, RETICCTPCT,  in the last 72 hours Coags: No results found for this basename: PT, INR,  in the last 72 hours Microbiology: Recent Results (from the past 240 hour(s))  MRSA PCR SCREENING     Status: None   Collection Time    12/20/13  8:53 PM      Result Value Ref Range Status   MRSA by PCR NEGATIVE  NEGATIVE Final   Comment:            The GeneXpert MRSA Assay (FDA     approved for NASAL specimens     only), is one component of a     comprehensive MRSA colonization     surveillance program. It is not     intended to diagnose MRSA     infection nor to guide or     monitor treatment for     MRSA infections.     BRIEF HOSPITAL COURSE:    Acute on chronic renal failure stage IV -Presented with creatinine of 4.0, baseline creatinine from October of 2014 was 2.8.  -Renal ultrasound showed no evidence of acute abnormalities. -Nephrology consulted,  patient was admitted with supportive care. Creatinine now down trending, down to 3.2 at the day of discharge. Although has significant metabolic acidosis with a bicarbonate of 11, spoke with Dr. Daisy Lazar on call who suggested that since creatinine was improving, patient was stable for discharge from a point of view as well. Mild hyperkalemia, will be given a dose of Kayexalate prior to discharge. Suggest repeating electrolytes in the next 3 days. Nephrology will need to follow this patient closely in the outpatient setting, Dr. Moshe Cipro will speak with Dr. Mercy Moore and arrange appropriate followup. - Spoke with family and patient, at this time patient is leaning on not starting dialysis. Patient exhibits significant failure to thrive syndrome, per family he has lost a significant amount of weight and has a very poor oral appetite. Family is very aware of poor overall prognosis.  Generalized weakness - Multifactorial, but suspect this is secondary to failure to thrive syndrome, worsening renal failure, dehydration. - Continues to be significantly weak, family prefers that the patient go back to assisted living facility, hopefully, can be followed by physical therapy services while at assisted living facility.  Malignant hypertension - Admitted, restarted on usual antihypertensive medications, further adjustments made. Blood pressure better but still fluctuating and moderately uncontrolled. Feel stable for discharge, further optimization to be done in the outpatient setting.  History of CVA with resultant right hemiplegia - Continue Plavix, continue antihypertensives.  History of dysphagia - Seen by speech therapy services and recommended to be on a dysphagia 1 diet with  nectar thick liquids, speech therapy services also recommended a modified barium swallow. Patient's spouse does not want any further investigations at this point, she and the patient accepts all risks of aspiration, and want to continue a regular diet. Life-threatening effects of aspiration including respiratory failure and pneumonia have been explained to the patient and family. Please have speech therapy follow patient while at assisted living facility.  Failure to thrive syndrome - Patient losing significant amount of weight, very weak at times, and with a poor appetite. Difficult situation, will ask for physical therapy services to continue to follow patient while at his assisted living facility. Continue Ensure on discharge. - Overall prognosis is failure to thrive syndrome continues.  Severe protein calorie malnutrition - Likely secondary to chronic disease, continue with nutrition supplements on discharge.  TODAY-DAY OF DISCHARGE:  Subjective:   Kevin Escobar today has no headache,no chest abdominal pain,no new weakness tingling or numbness, feels much better wants to go home today.   Objective:   Blood pressure 168/80, pulse 80, temperature 97.4 F (36.3 C), temperature source Oral, resp. rate 20, height 6' (1.829 m), weight 61.1 kg (134 lb 11.2 oz), SpO2 94.00%.  Intake/Output Summary (Last 24 hours) at 12/22/13 1431 Last data filed at 12/22/13 1103  Gross per 24 hour  Intake      0 ml  Output    651 ml  Net   -651 ml   Filed Weights   12/20/13 2050  Weight: 61.1 kg (134 lb 11.2 oz)    Exam Awake Alert, Oriented *3, No new F.N deficits, Normal affect [place].AT,PERRAL Supple Neck,No JVD, No cervical lymphadenopathy appriciated.  Symmetrical Chest wall movement, Good air movement bilaterally, CTAB RRR,No Gallops,Rubs or new Murmurs, No Parasternal Heave +ve B.Sounds, Abd Soft, Non tender, No organomegaly appriciated, No rebound -guarding or rigidity. No Cyanosis, Clubbing  or edema, No new Rash or bruise  DISCHARGE CONDITION: Stable  DISPOSITION: ALF   DISCHARGE INSTRUCTIONS:  Activity:  As tolerated with Full fall precautions use walker/cane & assistance as needed  Strict aspiration precautions  Diet recommendation: Heart Healthy diet with aspiration precautions  Discharge Instructions   Call MD for:  difficulty breathing, headache or visual disturbances    Complete by:  As directed      Call MD for:  persistant dizziness or light-headedness    Complete by:  As directed      Diet - low sodium heart healthy    Complete by:  As directed      Increase activity slowly    Complete by:  As directed            Follow-up Information   Follow up with Ria Bush, MD. Schedule an appointment as soon as possible for a visit in 1 week. (Appointment with Dr. Danise Mina is on  5/)    Specialty:  Family Medicine   Contact information:   [place] [address] [phone]       Follow up with Windy Kalata, MD. Schedule an appointment as soon as possible for a visit in 1 week.   Specialty:  Nephrology   Contact information:   [place] [address] [phone]       Total Time spent on discharge equals 45 minutes.  Signed: Jonetta Osgood 12/22/2013 2:31 PM  **Disclaimer: This note may have been dictated with voice recognition software. Similar sounding words can inadvertently be transcribed and this note may contain transcription errors which may not have been corrected upon publication of note.**

## 2013-12-22 NOTE — Evaluation (Signed)
Occupational Therapy Evaluation Patient Details Name: Kevin Escobar MRN: [id number] DOB: 10/27/1940 Today's Date: 12/22/2013    History of Present Illness 73 yo male adm 12/20/13 with acute on chronic kidney failure, increasing weakness last 2 weeks at [facility]F; PMHx:  CVA with right hemiparesis, HTN, depression, AAA with repair. Pt is at ALF for 30 day trial per wife.   Clinical Impression   PT admitted with acute kidney failure. Pt currently with functional limitiations due to the deficits listed below (see OT problem list). PTA pt required one person (A) for adls and transfers. Pt will benefit from skilled OT to increase their independence and safety with adls and balance to allow discharge SNF.     Follow Up Recommendations  SNF    Equipment Recommendations  3 in 1 bedside comode;Wheelchair (measurements OT);Wheelchair cushion (measurements OT);Hospital bed    Recommendations for Other Services       Precautions / Restrictions Precautions Precautions: Fall Precaution Comments: R hemiplegia      Mobility Bed Mobility Overal bed mobility: Needs Assistance Bed Mobility: Supine to Sit;Sit to Supine     Supine to sit: Mod assist;HOB elevated (rails) Sit to supine: Max assist;HOB elevated ((A) for bil LE)   General bed mobility comments: Pt with bil LE adducted with bed mobility. pt verbalized pain with attempts to slide Rt LE toward EOB. Pt required Pad use to pivot at hips bringing bil LE to eob. Pt reaching for rails to assist   Transfers Overall transfer level: Needs assistance   Transfers: Sit to/from Stand;Stand Pivot Transfers Sit to Stand: Mod assist Stand pivot transfers: Max assist (transfering to the Right side)       General transfer comment: Pt is able to pivot to the left mod (A). pt requires RT LE blocked to wegiht bear and transfer Right side    Balance Overall balance assessment: Needs assistance Sitting-balance support: Single  extremity supported;Feet supported Sitting balance-Leahy Scale: Fair     Standing balance support: Single extremity supported;During functional activity Standing balance-Leahy Scale: Poor                              ADL Overall ADL's : Needs assistance/impaired                         Toilet Transfer: Moderate assistance;Stand-pivot;BSC Toilet Transfer Details (indicate cue type and reason): Pt transfered to Left going to toilet moderate (A). pt required Max (A) transfering to the Right back to bed surface Toileting- Clothing Manipulation and Hygiene: +2 for physical assistance;Maximal assistance;Sit to/from stand         General ADL Comments: pt requesting 3n1. pt transfered to 3n1 and provided extended time to void. pt expressed difficulty going to bathroom. pt able to void bowels with continued smearing. Pt return to supine due to prolonged sitting and encouraged to transfer back on 3n1 after a short rest. Pt straining to produce bowel movement. Pt expressed pain with peri hygiene. tech present to (A).     Vision                     Perception     Praxis      Pertinent Vitals/Pain Discomfort due to ability to void bowels. Pt reports incr comfort with ability to void bowels on 3n1     Hand Dominance Right   Extremity/Trunk Assessment Upper Extremity Assessment  Upper Extremity Assessment: RUE deficits/detail RUE Deficits / Details: tone noted shoulder abduction, flexion and extension. Elbow tone but able to PROM to -10 degrees with incr time. Pt with MCP contractures to all digits, first digit contracture in full extension DIP / PIP.  RUE Coordination: decreased fine motor;decreased gross motor   Lower Extremity Assessment Lower Extremity Assessment: Defer to PT evaluation RLE Deficits / Details: Pt noted to have pain with abduction at hip due to tone,    Cervical / Trunk Assessment Cervical / Trunk Assessment: Normal   Communication  Communication Communication: Expressive difficulties   Cognition Arousal/Alertness: Awake/alert Behavior During Therapy: WFL for tasks assessed/performed Overall Cognitive Status: Difficult to assess                     General Comments       Exercises       Shoulder Instructions      Home Living Family/patient expects to be discharged to:: Assisted living                                 Additional Comments: [facility]- wife reporst patient being at home with one person (A) until may 5th. pt has had progressive weakness that requires incr (A). Pt recently with botox shot to RT UE and incr weakness      Prior Functioning/Environment Level of Independence: Needs assistance  Gait / Transfers Assistance Needed: stand pivot only ADL's / Homemaking Assistance Needed: wife reports one person (A) to w/c, one person (A) to toilet transfer stand pivot, pt requires (A) for peri care and pulling up pants. Wife reports ability to use Rt UE to hold pants up but inability to perform digit extension to release pants        OT Diagnosis: Generalized weakness;Hemiplegia dominant side   OT Problem List: Decreased strength;Decreased activity tolerance;Impaired balance (sitting and/or standing);Decreased safety awareness;Decreased knowledge of precautions;Decreased knowledge of use of DME or AE;Pain   OT Treatment/Interventions: Self-care/ADL training;Therapeutic exercise;Therapeutic activities;Balance training;Patient/family education;Neuromuscular education;Manual therapy;DME and/or AE instruction    OT Goals(Current goals can be found in the care plan section) Acute Rehab OT Goals Patient Stated Goal: to return back to ALF if possible  OT Goal Formulation: With patient Time For Goal Achievement: 01/05/14 Potential to Achieve Goals: Good  OT Frequency: Min 2X/week   Barriers to D/C:            Co-evaluation              End of Session Nurse  Communication: Mobility status;Precautions  Activity Tolerance: Patient tolerated treatment well Patient left: in bed;with call bell/phone within reach;with bed alarm set;with nursing/sitter in room   Time: 1041-1108 OT Time Calculation (min): 27 min Charges:  OT General Charges $OT Visit: 1 Procedure OT Evaluation $Initial OT Evaluation Tier I: 1 Procedure OT Treatments $Self Care/Home Management : 8-22 mins G-Codes:    Peri Maris 21-Jan-2014, 2:06 PM Pager: [phone]

## 2013-12-22 NOTE — Evaluation (Signed)
Clinical/Bedside Swallow Evaluation Patient Details  Name: Kevin Escobar MRN: [id number] Date of Birth: October 21, 1940  Today's Date: 12/22/2013 Time: 0900-0918 SLP Time Calculation (min): 18 min  Past Medical History:  Past Medical History  Diagnosis Date  . Cancer of bladder 2010    Dr. Lawerance Bach  . Cancer of prostate 1994    Dr. Lawerance Bach  . Depression   . CKD (chronic kidney disease) stage 4, GFR 15-29 ml/min     solitary right kidney, renal vascular disease ([place])  . Hypertension   . TIA (transient ischemic attack) 2008  . PVD (peripheral vascular disease) 02/21/2013  . CVA (cerebral infarction) 02/2013    residual R hemiparesis  . AAA (abdominal aortic aneurysm) 1995    s/p repair  . Dyslipidemia    Past Surgical History:  Past Surgical History  Procedure Laterality Date  . Abdominal aortic aneurysm repair  1995  . Prostate surgery  1994  . Colon surgery  2010    fistula repair  . Hernia repair  2005   HPI:  73 y.o. male from [facility] with c/o worsening weaknes, N/V and patient was noticed to be hypertensive.  PMH:  CVA7/2014, prostate cancer, CKD, TIA, AAA.  MBS 02/25/13 revealed audible aspiration of thin liquids x 2 during testing with recommendation of Dysphagia 1 (Puree);Nectar-thick liquid (single ice chips.   Assessment / Plan / Recommendation Clinical Impression  Pt. noted to have what appeared to be upper airway congestion at baseline.  Subtle indications of pharyngeal dysfunction with suspected delayed swallow initiation.  Given history of aspiration with modified solids/liquids and congestion/clinical observations, recommend MBS which can be completed today.  Pt. may continue Dys1, nectar until MBS.    Aspiration Risk  Moderate    Diet Recommendation Dysphagia 1 (Puree);Nectar-thick liquid   Liquid Administration via: Cup;No straw Medication Administration: Whole meds with puree Supervision: Patient able to self feed;Full supervision/cueing  for compensatory strategies Compensations: Slow rate;Small sips/bites Postural Changes and/or Swallow Maneuvers: Seated upright 90 degrees    Other  Recommendations Recommended Consults: MBS Oral Care Recommendations: Oral care BID   Follow Up Recommendations  Skilled Nursing facility    Frequency and Duration        Pertinent Vitals/Pain WDL         Swallow Study         Oral/Motor/Sensory Function Overall Oral Motor/Sensory Function: Appears within functional limits for tasks assessed   Ice Chips Ice chips: Not tested   Thin Liquid Thin Liquid: Not tested (on nectar thick liquids)    Nectar Thick Nectar Thick Liquid: Impaired Presentation: Cup Pharyngeal Phase Impairments: Suspected delayed Swallow   Honey Thick Honey Thick Liquid: Not tested   Puree Puree: Impaired Pharyngeal Phase Impairments: Suspected delayed Swallow   Solid   GO Functional Assessment Tool Used: clinical judgement Functional Limitations: Swallowing Swallow Current Status (H8299): At least 40 percent but less than 60 percent impaired, limited or restricted Swallow Goal Status [phone]): At least 20 percent but less than 40 percent impaired, limited or restricted  Solid: Not tested       Houston Siren M.Ed [employer] [phone]  12/22/2013

## 2013-12-22 NOTE — Progress Notes (Signed)
Subjective:  Resting today- no further vomiting- blood pressure is still high- reasonable UOP, creatinine down Objective Vital signs in last 24 hours: Filed Vitals:   12/21/13 1530 12/21/13 2043 12/22/13 0436 12/22/13 1039  BP: 157/81 181/99 170/77 168/80  Pulse:  87 73 80  Temp:  98.5 F (36.9 C) 97.4 F (36.3 C)   TempSrc:  Oral Oral   Resp:  20 20   Height:      Weight:      SpO2:  95% 94%    Weight change:   Intake/Output Summary (Last 24 hours) at 12/22/13 1218 Last data filed at 12/22/13 1103  Gross per 24 hour  Intake      0 ml  Output    651 ml  Net   -651 ml   Assessment/Plan: 73 year old white male with stage IV CKD. at baseline. He's experienced somewhat of a failure to thrive over the last month and has been placed at [facility]. He was admitted with nausea vomiting and persistently high blood pressure.  He had a transient increase in his creatinine which is to be trending better although I cannot be certain that Kevin Escobar is not uremic.  1.Renal- there are no acute indications for dialysis. GFR by blood work today is 17 mils per minute. I attempted to explain to the patient and the wife where we are. I wanted to start discussion regarding his future which may or may not include dialysis. Because he is residing in a nursing facility he would need to do hemodialysis. I did not painting rosy picture of hemodialysis. I also explained that sometimes people made the decision to not pursue dialysis therapy so that they can maintain their quality of life. Patient and wife seem to understand what I was saying. They will take time to think about what they want his future to hold.  I'm not sure if his recent failure to thrive has to do with uremia or something different. The good news is that he's feeling better today and his number is a little better- he no longer needs inpatient care 2. Hypertension/volume - blood pressure which seems malignant at this time. He is on a regimen  of Norvasc 10 mg, clonidine 0.1 3 times a day and Lopressor 12 and half twice a day. I increased the dosing of both his clonidine and Lopressor.  3. Anemia - this does not appear to be an issue for the patient at this time.  4.Dispo- so since he feels clinically well and his creatinine is unchanged to improved I think he could be discharged back to [facility] to followup with Dr. Mercy Moore. The patient's wife and brother indicate that patient has indicated he may not wish to pursue dialysis    [place]: Basic Metabolic Panel:  Recent Labs Lab 12/20/13 1410 12/21/13 0718 12/22/13 0945  NA 138 142 139  K 3.7 3.4* 5.5*  CL 102 110 113*  CO2 18* 17* 11*  GLUCOSE 120* 82 101*  BUN 54* 49* 46*  CREATININE 4.04* 3.70* 3.21*  CALCIUM 9.8 9.1 9.3  PHOS  --   --  2.2*   Liver Function Tests:  Recent Labs Lab 12/20/13 1410 12/22/13 0945  AST 8  --   ALT 7  --   ALKPHOS 95  --   BILITOT 0.3  --   PROT 7.5  --   ALBUMIN 3.4* 2.8*   No results found for this basename: LIPASE, AMYLASE,  in the last 168 hours  Recent Labs Lab 12/20/13 1410  AMMONIA 15   CBC:  Recent Labs Lab 12/20/13 1410 12/21/13 0718  WBC 10.8* 9.1  NEUTROABS 8.5*  --   HGB 13.8 11.9*  HCT 39.4 35.4*  MCV 84.2 84.9  PLT 395 332   Cardiac Enzymes: No results found for this basename: CKTOTAL, CKMB, CKMBINDEX, TROPONINI,  in the last 168 hours CBG: No results found for this basename: GLUCAP,  in the last 168 hours  Iron Studies: No results found for this basename: IRON, TIBC, TRANSFERRIN, FERRITIN,  in the last 72 hours Studies/Results: US Renal  12/21/2013   CLINICAL DATA:  Renal insufficiency  EXAM: RETROPERITONEAL ULTRASOUND  COMPARISON:  None.  FINDINGS: Right kidney measures 11.5 cm in length. Left kidney measures 8.2 cm in length. Both kidneys are echogenic with renal cortical thinning, more notable on the left than on the right.  There is a 1.7 x 1.5 x 1.3 cm simple  cyst arising from the upper pole the left kidney. No other renal masses are identified on either side. There is no pelvicaliectasis or perinephric fluid collection on either side. There is no sonographically demonstrable calculus or ureterectasis on either side.  No lesion is seen by ultrasound in the region of the urinary bladder.  IMPRESSION: Both kidneys show renal cortical thinning and increased echogenicity, findings associated with medical renal disease.  Small cyst upper pole left kidney. No other renal mass is appreciable.  The left kidney is significantly smaller within the right kidney. This finding could indicate renal artery stenosis on the left. In this regard, question whether the patient is hypertensive.   Electronically Signed   By: Lowella Grip M.D.   On: 12/21/2013 10:51   Medications: Infusions:    Scheduled Medications: . amLODipine  10 mg Oral Daily  . antiseptic oral rinse  15 mL Mouth Rinse BID  . atorvastatin  20 mg Oral q1800  . cloNIDine  0.2 mg Oral TID  . clopidogrel  75 mg Oral Q breakfast  . docusate sodium  200 mg Oral BID  . feeding supplement (ENSURE)  1 Container Oral TID BM  . flavoxATE  100 mg Oral Daily  . heparin  5,000 Units Subcutaneous 3 times per day  . metoprolol tartrate  25 mg Oral BID  . sertraline  100 mg Oral Daily  . sodium bicarbonate  1,300 mg Oral BID  . sodium polystyrene  30 g Oral Once    have reviewed scheduled and prn medications.  Physical Exam: General: resting, NAD Heart: RRR Lungs: mostly clear Abdomen: soft, non tender Extremities: minimal edema    12/22/2013,12:18 PM  LOS: 2 days

## 2013-12-23 NOTE — Progress Notes (Signed)
OT NOTE- LATE GCODE ENTRY   12/22/13 1300  OT G-codes **NOT FOR INPATIENT CLASS**  Functional Assessment Tool Used clincial judgement  Functional Limitation Self care  Self Care Current Status (J1552) CM  Self Care Goal Status (C8022) CM     Jeri Modena   OTR/L Pager: [phone] Office: [phone] .

## 2013-12-24 ENCOUNTER — Telehealth: Payer: Self-pay

## 2013-12-24 NOTE — Telephone Encounter (Signed)
Order already faxed. 

## 2013-12-24 NOTE — Telephone Encounter (Signed)
Priscilla with [facility] left v/m; [facility] is assisted living and only has labs drawn on Tues; [place] request new order for BMP to be drawn on 12/28/13 and faxed to [phone].

## 2013-12-24 NOTE — Telephone Encounter (Signed)
[facility] for this order - hospice eval.

## 2013-12-24 NOTE — Telephone Encounter (Signed)
Denice Paradise nurse with [facility] and [place] at [facility] request change from pallative care to hospice care. Denice Paradise said pt qualifies. Denice Paradise request cb today.

## 2013-12-24 NOTE — Telephone Encounter (Signed)
Message left notifying Denice Paradise.

## 2013-12-29 ENCOUNTER — Encounter: Payer: Self-pay | Admitting: Family Medicine

## 2013-12-29 LAB — BASIC METABOLIC PANEL
BUN: 42 mg/dL — AB (ref 4–21)
CREATININE: 3.42
Glucose: 103
Potassium: 3.5 mmol/L
SODIUM: 138 mmol/L (ref 137–147)

## 2014-01-06 ENCOUNTER — Ambulatory Visit: Payer: 59 | Admitting: Physical Medicine & Rehabilitation

## 2014-01-10 DIAGNOSIS — C61 Malignant neoplasm of prostate: Secondary | ICD-10-CM | POA: Diagnosis not present

## 2014-01-10 DIAGNOSIS — D0910 Carcinoma in situ of unspecified urinary organ: Secondary | ICD-10-CM | POA: Diagnosis not present

## 2014-01-10 DIAGNOSIS — I69919 Unspecified symptoms and signs involving cognitive functions following unspecified cerebrovascular disease: Secondary | ICD-10-CM | POA: Diagnosis not present

## 2014-01-12 ENCOUNTER — Telehealth: Payer: Self-pay

## 2014-01-12 NOTE — Telephone Encounter (Signed)
[employer] is with [employer]. No more paperwork should be required.

## 2014-01-12 NOTE — Telephone Encounter (Signed)
The paperwork this morning was for [facility].  I called Renee and asked her to fax the form she needed to me and I'll give it to Dr.Gutierrez.

## 2014-01-12 NOTE — Telephone Encounter (Signed)
This was in my out box this morning and has now been taken.I expect it to be faxed today.

## 2014-01-12 NOTE — Telephone Encounter (Signed)
Kevin Escobar with [facility] service left v/m requesting status of updated plan of care faxed to Dr Darnell Level on 12/28/13. Requested updated plan of care signed by Dr Darnell Level and faxed to [phone].Please advise.

## 2014-01-13 NOTE — Telephone Encounter (Signed)
Renee faxed me the paperwork for OT and Dr.Gutierrez filled it out and I faxed it back to her yesterday.

## 2014-01-17 ENCOUNTER — Telehealth: Payer: Self-pay | Admitting: *Deleted

## 2014-01-17 NOTE — Telephone Encounter (Signed)
Transferred to Kevin Escobar on 01/06/14. His wife is saying that he is slower to respond and typically when he gets like this she said he "has a build up in his kidneys" and she wants him to have labs. They are unclear of what labs she is talking about but are thinking possibly a CMP. Would you be willing to order this for him?

## 2014-01-17 NOTE — Telephone Encounter (Signed)
Ok to order - would get CMP and CBC for altered mental status

## 2014-01-18 NOTE — Telephone Encounter (Signed)
Adam notified and will notify us of results.

## 2014-01-19 ENCOUNTER — Other Ambulatory Visit: Payer: Self-pay | Admitting: *Deleted

## 2014-01-19 ENCOUNTER — Telehealth: Payer: Self-pay | Admitting: Family Medicine

## 2014-01-19 ENCOUNTER — Other Ambulatory Visit: Payer: Self-pay

## 2014-01-19 LAB — CBC WITH DIFFERENTIAL/PLATELET
BASOS PCT: 0.9 %
Basophil #: 0.1 10*3/uL (ref 0.0–0.1)
EOS ABS: 0.4 10*3/uL (ref 0.0–0.7)
Eosinophil %: 3.6 %
HCT: 30.3 % — ABNORMAL LOW (ref 40.0–52.0)
HGB: 10.1 g/dL — ABNORMAL LOW (ref 13.0–18.0)
Lymphocyte #: 1.8 10*3/uL (ref 1.0–3.6)
Lymphocyte %: 17 %
MCH: 29 pg (ref 26.0–34.0)
MCHC: 33.2 g/dL (ref 32.0–36.0)
MCV: 87 fL (ref 80–100)
MONOS PCT: 9.5 %
Monocyte #: 1 x10 3/mm (ref 0.2–1.0)
NEUTROS ABS: 7.5 10*3/uL — AB (ref 1.4–6.5)
Neutrophil %: 69 %
Platelet: 300 10*3/uL (ref 150–440)
RBC: 3.47 10*6/uL — ABNORMAL LOW (ref 4.40–5.90)
RDW: 14.2 % (ref 11.5–14.5)
WBC: 10.8 10*3/uL — AB (ref 3.8–10.6)

## 2014-01-19 LAB — BASIC METABOLIC PANEL
Anion Gap: 10 (ref 7–16)
BUN: 45 mg/dL — AB (ref 4–21)
BUN: 45 mg/dL — AB (ref 7–18)
CALCIUM: 8.5 mg/dL (ref 8.5–10.1)
CO2: 25 mmol/L (ref 21–32)
CREATININE: 3.83
Chloride: 102 mmol/L (ref 98–107)
Creatinine: 3.83 mg/dL — ABNORMAL HIGH (ref 0.60–1.30)
EGFR (African American): 17 — ABNORMAL LOW
GFR CALC NON AF AMER: 14 — AB
Glucose: 72
Glucose: 72 mg/dL (ref 65–99)
OSMOLALITY: 284 (ref 275–301)
Potassium: 3.9 mmol/L
Potassium: 3.9 mmol/L (ref 3.5–5.1)
Sodium: 137 mmol/L (ref 136–145)
Sodium: 137 mmol/L (ref 137–147)

## 2014-01-19 LAB — CBC
HEMOGLOBIN: 10.1 g/dL
WBC: 10.8
platelet count: 300

## 2014-01-19 NOTE — Telephone Encounter (Signed)
Crystal from [facility] called and requested written script to be faxed to facility pharmacy at [phone]. She is aware that you are out of the office and said it would be fine to wait until tomorrow. They accept written Rx's due to him being hospice patient. Med techs have also said he sometimes has difficulty swallowing and she was requesting an order to crush meds PRN. She will fax order.

## 2014-01-19 NOTE — Telephone Encounter (Signed)
[facility] sent a lab report dated 12/29/13.  They never received confirmation that the lab was received.  They need the front page of lab signed by Dr.Gutierrez as confirmation that he received the lab results.  Fax  [phone].

## 2014-01-19 NOTE — Telephone Encounter (Signed)
Please sign and return to me for faxing. In your IN box. Thanks.

## 2014-01-20 ENCOUNTER — Telehealth: Payer: Self-pay | Admitting: *Deleted

## 2014-01-20 ENCOUNTER — Telehealth: Payer: Self-pay | Admitting: Family Medicine

## 2014-01-20 MED ORDER — OXYCODONE-ACETAMINOPHEN 5-325 MG PO TABS: 1.0000 | ORAL_TABLET | Freq: Four times a day (QID) | ORAL | Status: AC | PRN

## 2014-01-20 NOTE — Telephone Encounter (Signed)
I faxed Rx to the pharmacy # she provided. The fax # she provided today is the fax # for the facility. Re-faxed Rx as requested.

## 2014-01-20 NOTE — Telephone Encounter (Signed)
Signed and placed in [name]'s box. 

## 2014-01-20 NOTE — Telephone Encounter (Signed)
Lab report faxed.

## 2014-01-20 NOTE — Telephone Encounter (Signed)
Kevin Escobar said she didn't receive the prescription for patient's percocet.  I let her know you faxed it, but she said they didn't receive it.  Fax# [phone].

## 2014-01-20 NOTE — Telephone Encounter (Signed)
Rx faxed as requested. Will await requested order for crushing meds.

## 2014-01-20 NOTE — Telephone Encounter (Signed)
Lab results in your IN box. Please review, sign and return to me. Thanks.

## 2014-01-20 NOTE — Telephone Encounter (Signed)
Reviewed. Done for altered mental status. Signed and placed in [name]'s box. I ordered CMP but only BMP was drawn. plz notify wife - kidneys stable but impaired. Hemoglobin slightly lower at 10.1. White count slightly elevated - any evidence of infection like skin infection, cough, dysuria? If dysuria rec UA with micro and culture/sensitivity.

## 2014-01-20 NOTE — Telephone Encounter (Signed)
printed and placed in kim's box

## 2014-01-21 NOTE — Telephone Encounter (Signed)
Patient's wife notified. She said he didn't seem to have any evidence of any type of infection at all. She did say he was only eating about 2 bites of food and the hospital had given him a diagnosis of failure to thrive. She wanted to know your thoughts on how much longer he had to live. I advised that that would be extremely hard for you or anyone to determine. I advised for her to speak with the doctors there at the facility and the hospice team as they could offer a little more insight on his failure to thrive status since they are with him on a daily basis. She verbalized understanding. Labs faxed back to facility.

## 2014-01-24 ENCOUNTER — Telehealth: Payer: Self-pay

## 2014-01-24 NOTE — Telephone Encounter (Signed)
Kevin Escobar at [facility]; pt had a fall on on 01/22/14; pt was already complaining about lower back pain before the fall but pt is complaining of back pain since the fall; pt has had pain med but still complains with pain level of 5. Kevin also wants to request order for speech evaluation for difficulty swallowing. Kevin request cb.

## 2014-01-24 NOTE — Telephone Encounter (Signed)
rec eval in office for back pain Ok to do speech therapy evaluation

## 2014-01-25 ENCOUNTER — Telehealth: Payer: Self-pay | Admitting: Family Medicine

## 2014-01-25 NOTE — Telephone Encounter (Signed)
Please write order for speech therapy. Thank you.

## 2014-01-25 NOTE — Telephone Encounter (Signed)
Crystal called and said she hasn't received the written order for speech therapy for patient.  Fax [phone].

## 2014-01-25 NOTE — Telephone Encounter (Signed)
Per [place] notified. Will need written order for speech therapy.

## 2014-01-26 ENCOUNTER — Encounter: Payer: Self-pay | Admitting: *Deleted

## 2014-01-26 NOTE — Telephone Encounter (Signed)
[facility] faxed order

## 2014-01-26 NOTE — Telephone Encounter (Signed)
Placed in [name]'s box.

## 2014-01-26 NOTE — Telephone Encounter (Signed)
order written and placed in Kevin Escobar's box.

## 2014-01-27 ENCOUNTER — Telehealth: Payer: Self-pay | Admitting: Radiology

## 2014-01-27 NOTE — Telephone Encounter (Signed)
Spoke with Mrs. Auth.  She states that patient has decided against dialysis even though his renal function is declining.  (Recent labs on 01/19/2014 in EPIC).    Functional status is poor:  Patient remains a resident of [facility].  Although he is receiving PT and OT, he is experiencing more difficulties with transfers, etc and requiring more assistance with ADL's.    She does not want to schedule follow up appointment with Dr Laurence Ferrari at present.  Notified Dr Jonathon Bellows, RN 01/27/2014 11:29 AM

## 2014-02-05 ENCOUNTER — Encounter: Payer: Self-pay | Admitting: Family Medicine

## 2014-02-05 DIAGNOSIS — Z515 Encounter for palliative care: Secondary | ICD-10-CM | POA: Insufficient documentation

## 2014-02-22 ENCOUNTER — Telehealth: Payer: Self-pay | Admitting: *Deleted

## 2014-02-22 LAB — CBC
HGB: 10 g/dL
PLATELET COUNT: 386
WBC: 11.4

## 2014-02-22 LAB — COMPLETE METABOLIC PANEL WITH GFR
ALT: 20
AST: 18 U/L
Albumin: 2.7
Alkaline Phosphatase: 114 U/L
BUN: 51 mg/dL — AB (ref 4–21)
Creat: 3.49
GFR, EST NON AFRICAN AMERICAN: 16
Glucose: 70
Potassium: 4.7 mmol/L
Sodium: 137 mmol/L (ref 137–147)

## 2014-02-22 LAB — PHOSPHORUS: PHOSPHORUS: 4.1 mg/dL (ref 2.5–4.9)

## 2014-02-22 NOTE — Telephone Encounter (Signed)
Refill request for fentanyl patch. It appears you have never prescribed this. Request from [facility] in your IN box for review.

## 2014-02-24 NOTE — Telephone Encounter (Signed)
Message left for staff at [facility]. Advised to check with Dr. Arnoldo Morale and call me back if she won't write Rx.

## 2014-02-24 NOTE — Telephone Encounter (Signed)
plz check to see what dose he is on and see if Dr. Jana Hakim won't write prescription (I believe she is MD for this facility). If she won't, then I will write script for this.

## 2014-03-04 ENCOUNTER — Encounter: Payer: Self-pay | Admitting: *Deleted

## 2014-03-08 ENCOUNTER — Telehealth: Payer: Self-pay

## 2014-03-08 MED ORDER — MORPHINE SULFATE (CONCENTRATE) 20 MG/ML PO SOLN: ORAL | Status: AC

## 2014-03-08 MED ORDER — LEVOFLOXACIN 250 MG PO TABS: ORAL_TABLET | ORAL | Status: AC

## 2014-03-08 NOTE — Telephone Encounter (Signed)
[facility] called back and said CXR report was back; left upper lobe pneumonia. Adam request antibiotic faxed to [place] RN at [facility]. Fax #s in earlier part of this note.Please advise.

## 2014-03-08 NOTE — Telephone Encounter (Signed)
rx printed for levaquin.

## 2014-03-08 NOTE — Telephone Encounter (Signed)
Rx faxed to all 3 numbers: Quita Skye  [place]     [phone]

## 2014-03-08 NOTE — Telephone Encounter (Signed)
[facility] at [facility] in [place] with pt; CXR has been done today but no results yet;? Pneumonia. Kevin Escobar took BP 180/118 P120 apically, R 28 T 99. Pt will follow you with his eyes and some times answer questions. Pt took BP med this AM and at 2 PM today; is not eating anything. No apparent distress, no coughing or SOB. Kevin Escobar wants order for Roxinol 20 mg/cc prefilled syringes; with instructions 0.25 cc prn for elevated BP and P. Please add perimeters if will order. Pt is on hospice.

## 2014-03-08 NOTE — Telephone Encounter (Signed)
Kevin Escobar was not on phone when I came back from speaking with Dr Damita Dunnings; unable to reach Kevin Escobar; spoke with Quita Skye RN case mgr at [facility] and he advised to fax prescription to [place] fax # [phone], [facility] fax # [phone] and [facility] fax # [phone]. Faxing completed.

## 2014-03-08 NOTE — Telephone Encounter (Signed)
Agree, done

## 2014-03-08 NOTE — Telephone Encounter (Signed)
Fax would not go thru to Kevin Escobar at [facility] spoke with Kevin Escobar and he said to fax to [phone]. refaxed rx for roxinol.

## 2014-03-16 ENCOUNTER — Telehealth: Payer: Self-pay | Admitting: Family Medicine

## 2014-03-16 NOTE — Telephone Encounter (Signed)
Spoke with wife, expressed my condolences.  

## 2014-04-05 DEATH — deceased

## 2014-05-07 IMAGING — CT CT HEAD W/O CM
2 series · 15 of 30 positions shown, 19 images · non-contrast
Comparison: CT scan 02/20/2013 and MRI 02/21/2013.

CLINICAL DATA: Worsening right-sided weakness.

CT HEAD WITHOUT CONTRAST
TECHNIQUE: Contiguous axial images were obtained from the base of
the skull through the vertex without contrast.

[Series 2: head w/o · axial · non-contrast · 0.49mm/px · z∈[+179,+309]mm · 13 of 32 slices shown, 17 images]
[im 3/32  brain]
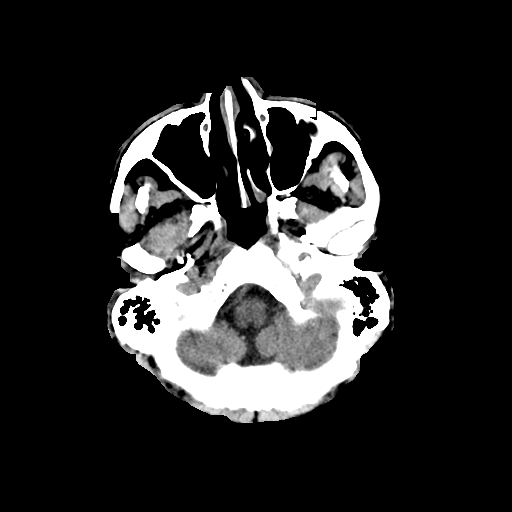
[im 3/32  bone]
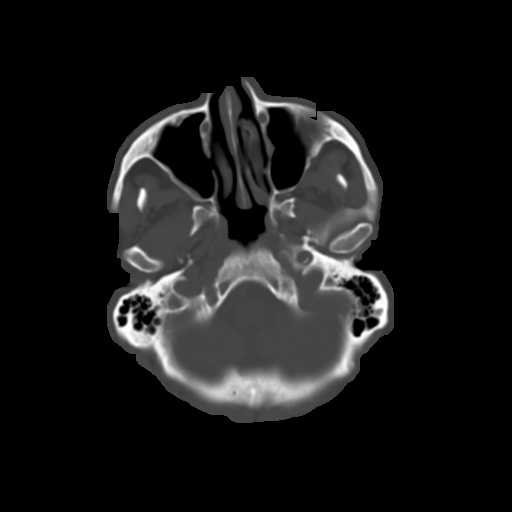
[im 5/32  brain]
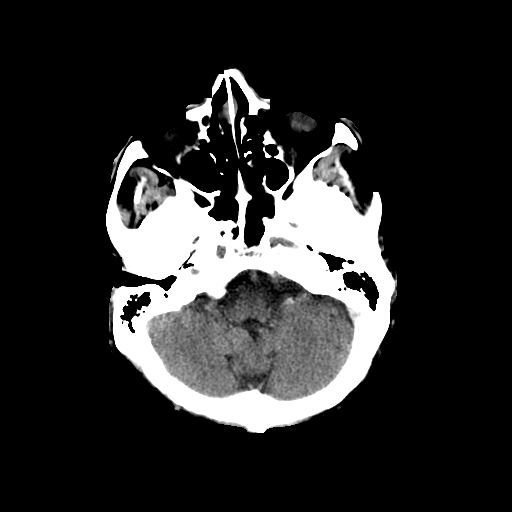
[im 7/32  brain]
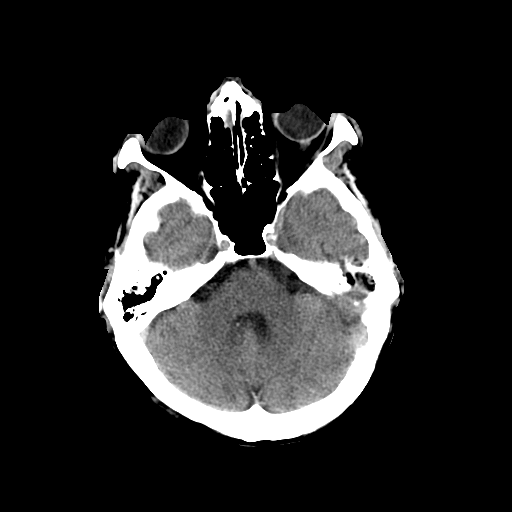
[im 9/32  brain]
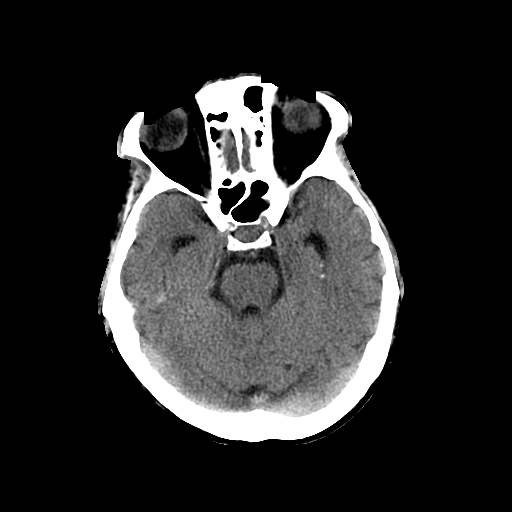
[im 12/32  brain]
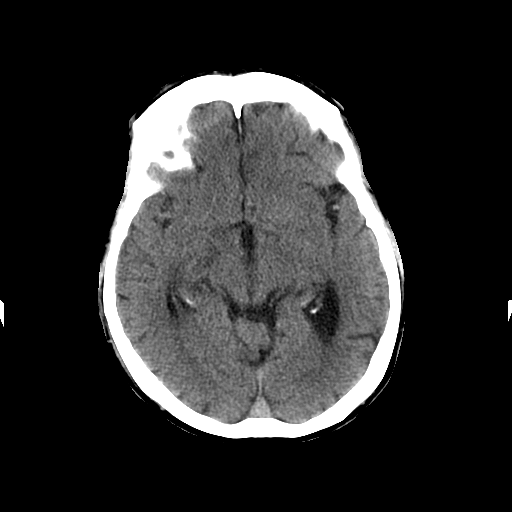
[im 12/32  bone]
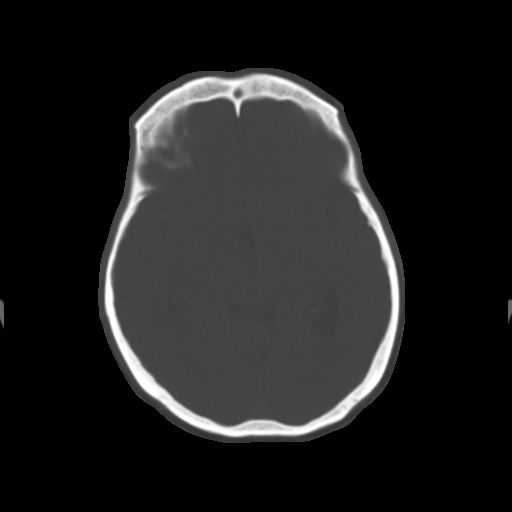
[im 14/32  brain]
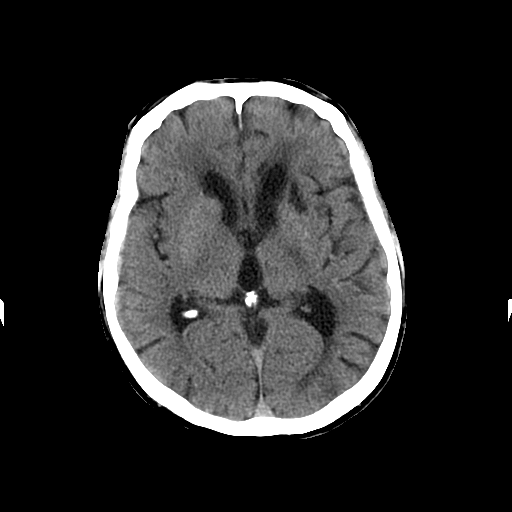
[im 16/32  brain]
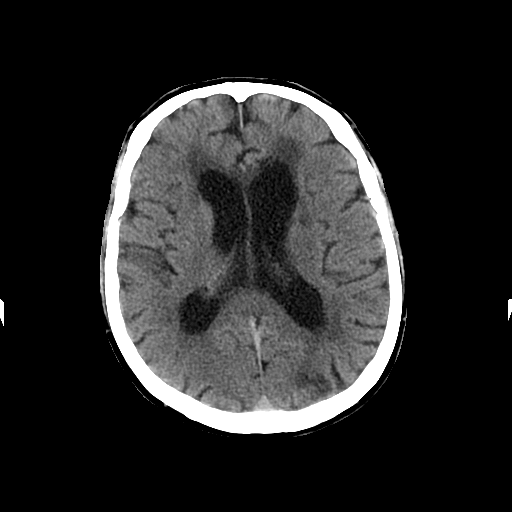
[im 18/32  brain]
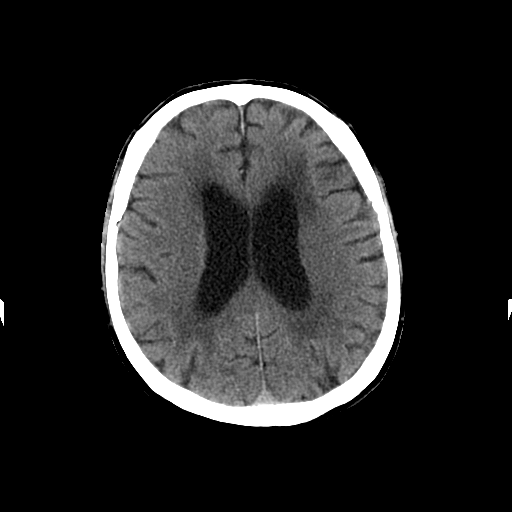
[im 20/32  brain]
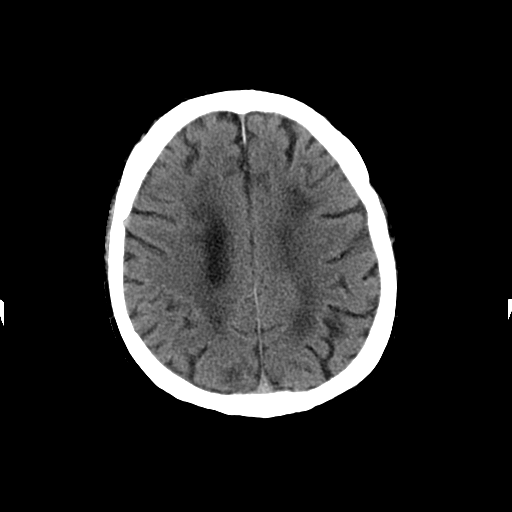
[im 20/32  bone]
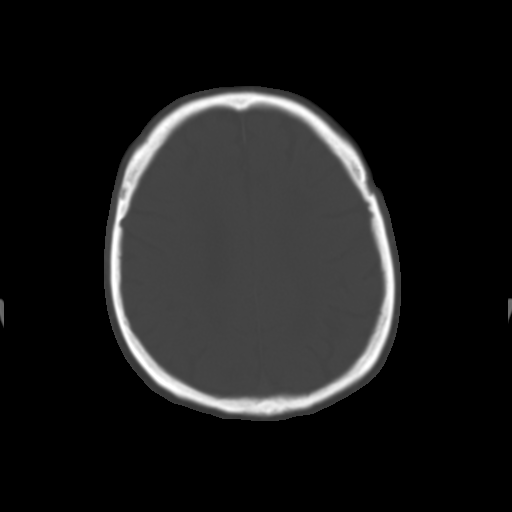
[im 23/32  brain]
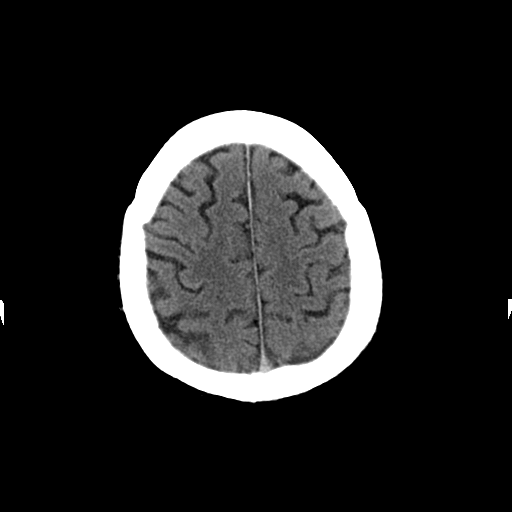
[im 25/32  brain]
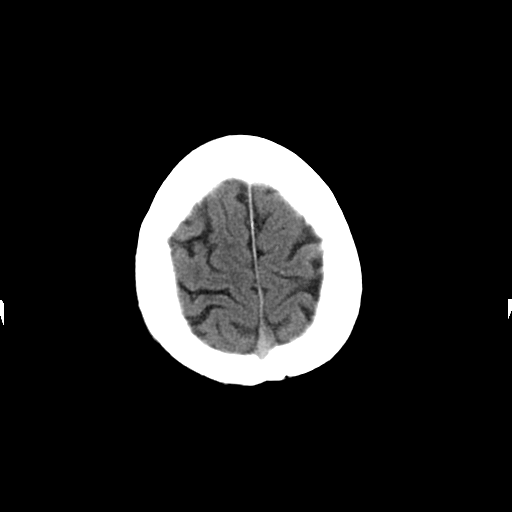
[im 27/32  brain]
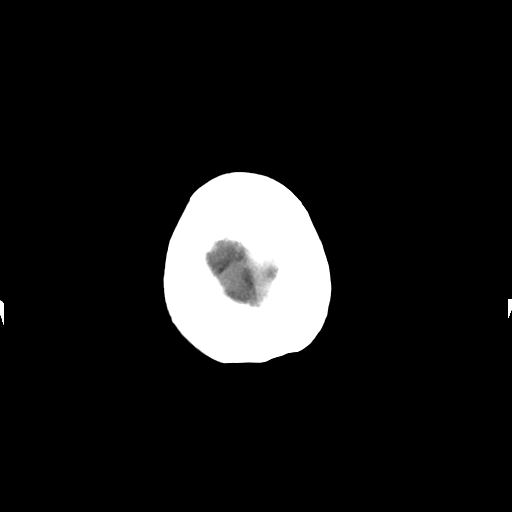
[im 29/32  brain]
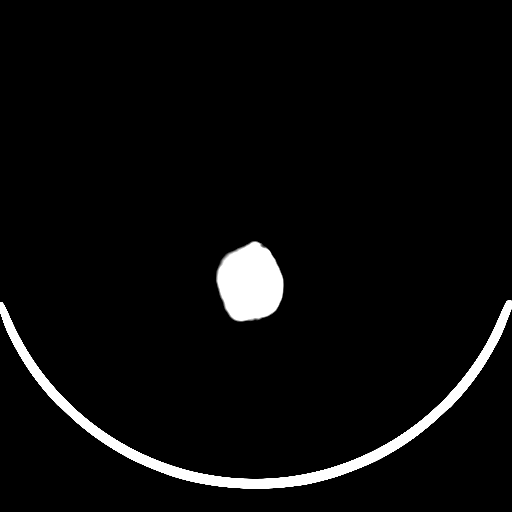
[im 29/32  bone]
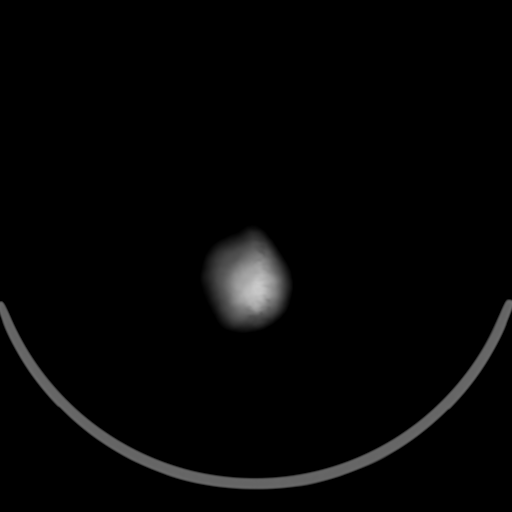

[Series 3: head w/o bone · axial · non-contrast · 0.49mm/px · z∈[+179,+199]mm · 2 of 32 slices shown]
[im 3/32  bone]
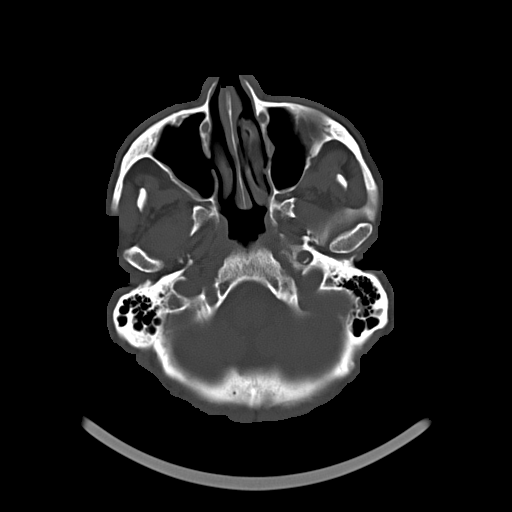
[im 7/32  bone]
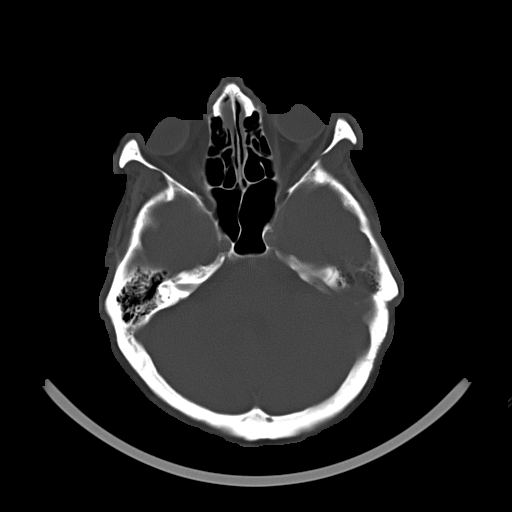

[15 of 30 positions shown; findings below may reference images not displayed]

FINDINGS: Stable age related cerebral atrophy, ventriculomegaly and
periventricular white matter disease.  There are remote lacunar
type infarcts and a remote left occipital cortical infarct.  The
left basal ganglia lacunar type infarct is again demonstrated.  No
complicating features such as hemorrhage.  No findings for acute
hemispheric infarction and no mass lesions.  The brainstem and
cerebellum are stable.

Stable patchy sinus disease.
IMPRESSION: 1.  Stable age related cerebral atrophy, ventriculomegaly and
periventricular white matter disease.
2.  Remote lacunar type basal ganglia infarcts and remote left
occipital cortical infarct.
3.  Acute left basal ganglia lacunar type infarct without
complicating features.
4.  No acute hemispheric infarction or intracranial hemorrhage.

## 2014-05-08 IMAGING — CR DG CHEST 1V PORT
1 series · 1 of 1 positions shown · non-contrast
Comparison: 02/21/2013

CLINICAL DATA: Congestion and shortness of breath.  Possible
aspiration.

PORTABLE CHEST - 1 VIEW

[AP]
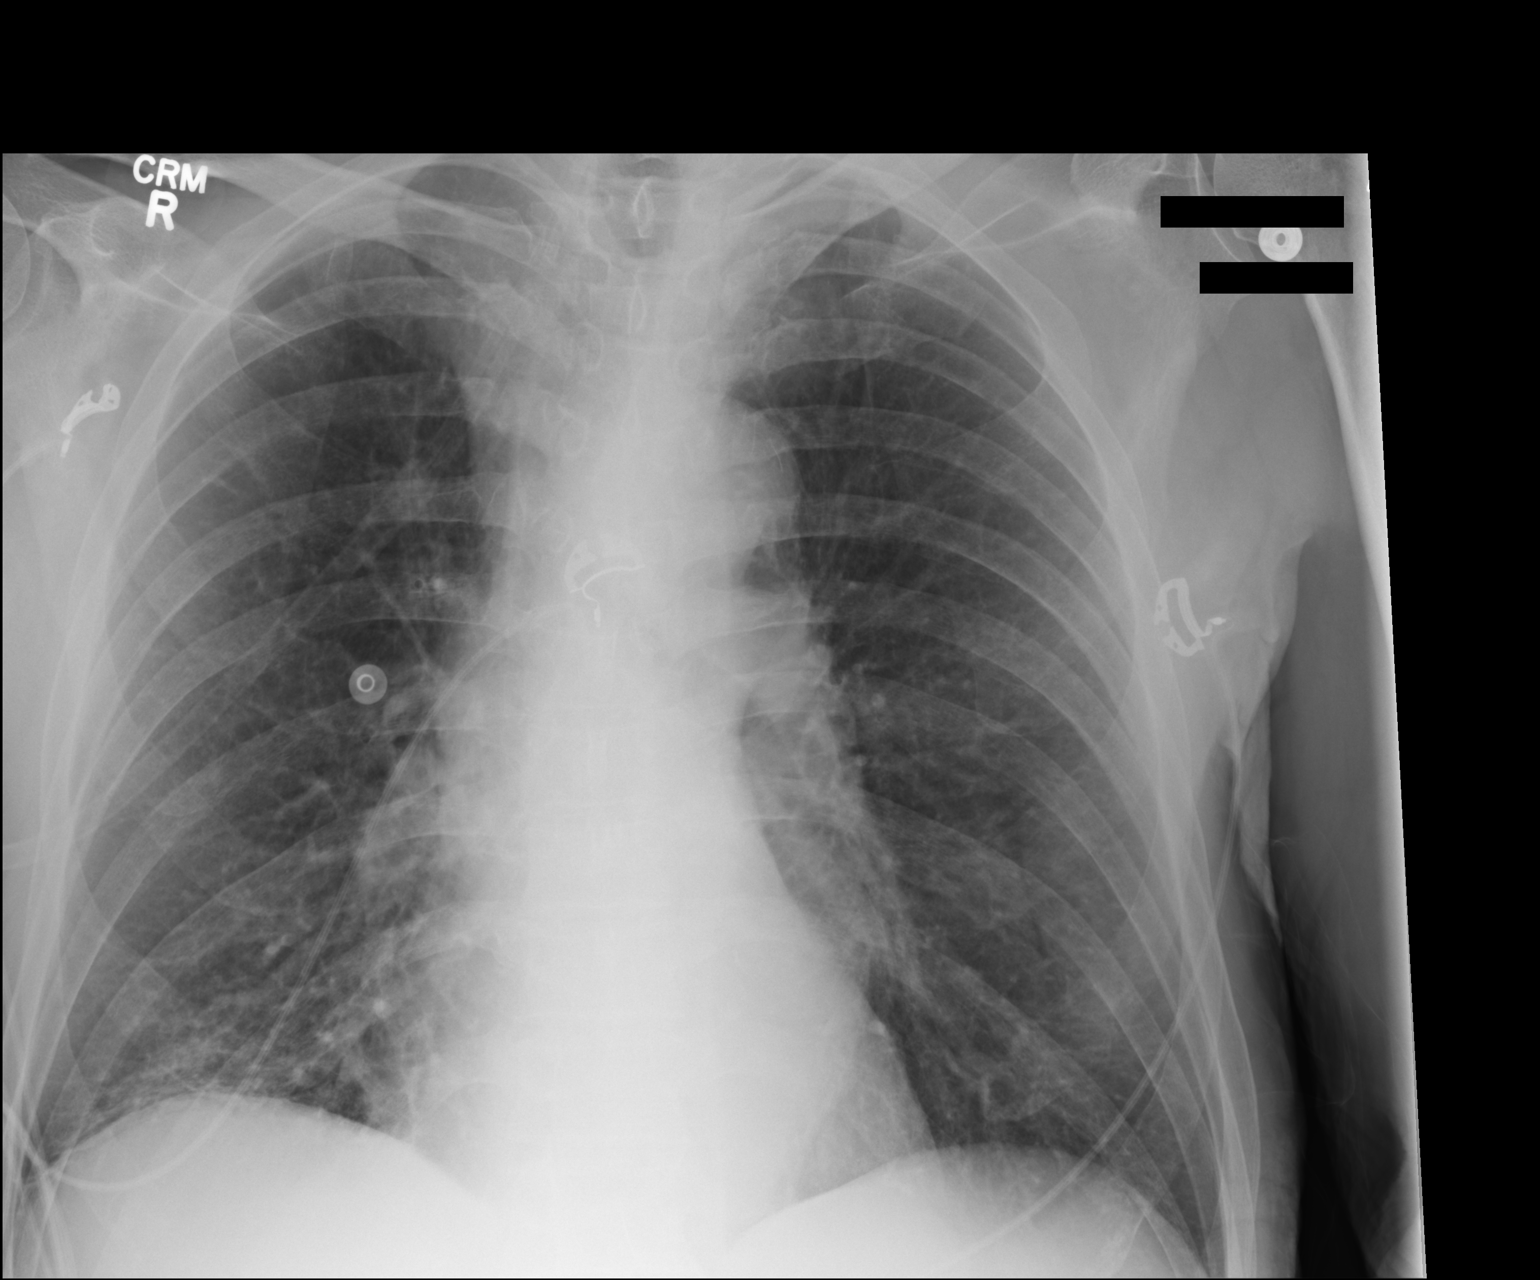

[1 of 1 positions shown; findings below may reference images not displayed]

FINDINGS: Hyperinflation suggesting emphysema.  Central
peribronchial thickening and interstitial changes likely due to
chronic bronchitis.  Linear fibrosis or atelectasis in the right
lung base.  Stable appearance since previous study.  No developing
airspace disease or consolidation.  No blunting of costophrenic
angles.  No pneumothorax.  Tortuous aorta.
IMPRESSION: Stable chronic emphysematous and bronchitic changes in the chest.
No evidence of active pulmonary disease.
